# Patient Record
Sex: Female | Born: 1950
Health system: Southern US, Community
[De-identification: ages and names within clinical notes are randomized; demographics above are authoritative.]

## PROBLEM LIST (undated history)

## (undated) DIAGNOSIS — N189 Chronic kidney disease, unspecified: Secondary | ICD-10-CM

## (undated) DIAGNOSIS — I10 Essential (primary) hypertension: Secondary | ICD-10-CM

## (undated) DIAGNOSIS — R0602 Shortness of breath: Secondary | ICD-10-CM

## (undated) DIAGNOSIS — K219 Gastro-esophageal reflux disease without esophagitis: Secondary | ICD-10-CM

## (undated) DIAGNOSIS — D649 Anemia, unspecified: Secondary | ICD-10-CM

## (undated) DIAGNOSIS — F329 Major depressive disorder, single episode, unspecified: Secondary | ICD-10-CM

## (undated) DIAGNOSIS — M25519 Pain in unspecified shoulder: Secondary | ICD-10-CM

## (undated) DIAGNOSIS — M1990 Unspecified osteoarthritis, unspecified site: Secondary | ICD-10-CM

## (undated) HISTORY — DX: Shortness of breath: R06.02

## (undated) HISTORY — DX: Essential (primary) hypertension: I10

## (undated) HISTORY — PX: TUBAL LIGATION: SHX77

## (undated) HISTORY — DX: Pain in unspecified shoulder: M25.519

## (undated) HISTORY — PX: EYE SURGERY: SHX253

---

## 1986-04-24 HISTORY — PX: BREAST SURGERY: SHX581

## 1998-03-26 ENCOUNTER — Other Ambulatory Visit: Admission: RE | Admit: 1998-03-26 | Discharge: 1998-03-26 | Payer: Self-pay | Admitting: *Deleted

## 1999-04-04 ENCOUNTER — Other Ambulatory Visit: Admission: RE | Admit: 1999-04-04 | Discharge: 1999-04-04 | Payer: Self-pay | Admitting: *Deleted

## 1999-09-14 ENCOUNTER — Encounter: Payer: Self-pay | Admitting: *Deleted

## 1999-09-14 ENCOUNTER — Ambulatory Visit (HOSPITAL_COMMUNITY): Admission: RE | Admit: 1999-09-14 | Discharge: 1999-09-14 | Payer: Self-pay | Admitting: *Deleted

## 2000-11-01 ENCOUNTER — Ambulatory Visit (HOSPITAL_COMMUNITY): Admission: RE | Admit: 2000-11-01 | Discharge: 2000-11-01 | Payer: Self-pay | Admitting: *Deleted

## 2000-11-01 ENCOUNTER — Encounter: Payer: Self-pay | Admitting: *Deleted

## 2001-04-24 HISTORY — PX: COLONOSCOPY: SHX174

## 2002-07-01 ENCOUNTER — Other Ambulatory Visit: Admission: RE | Admit: 2002-07-01 | Discharge: 2002-07-01 | Payer: Self-pay | Admitting: *Deleted

## 2003-05-19 ENCOUNTER — Encounter: Admission: RE | Admit: 2003-05-19 | Discharge: 2003-05-19 | Payer: Self-pay | Admitting: Sports Medicine

## 2003-05-21 ENCOUNTER — Ambulatory Visit (HOSPITAL_COMMUNITY): Admission: RE | Admit: 2003-05-21 | Discharge: 2003-05-21 | Payer: Self-pay | Admitting: Sports Medicine

## 2003-05-25 ENCOUNTER — Encounter: Admission: RE | Admit: 2003-05-25 | Discharge: 2003-05-25 | Payer: Self-pay | Admitting: Sports Medicine

## 2004-02-23 ENCOUNTER — Ambulatory Visit (HOSPITAL_COMMUNITY): Admission: RE | Admit: 2004-02-23 | Discharge: 2004-02-23 | Payer: Self-pay | Admitting: Internal Medicine

## 2005-05-02 ENCOUNTER — Ambulatory Visit (HOSPITAL_COMMUNITY): Admission: RE | Admit: 2005-05-02 | Discharge: 2005-05-02 | Payer: Self-pay | Admitting: Obstetrics and Gynecology

## 2005-05-09 ENCOUNTER — Ambulatory Visit: Payer: Self-pay | Admitting: Internal Medicine

## 2005-06-15 ENCOUNTER — Ambulatory Visit: Payer: Self-pay | Admitting: Internal Medicine

## 2006-05-31 ENCOUNTER — Ambulatory Visit: Payer: Self-pay | Admitting: Internal Medicine

## 2006-06-12 ENCOUNTER — Ambulatory Visit (HOSPITAL_COMMUNITY): Admission: RE | Admit: 2006-06-12 | Discharge: 2006-06-12 | Payer: Self-pay | Admitting: Obstetrics and Gynecology

## 2007-02-12 ENCOUNTER — Ambulatory Visit: Payer: Self-pay | Admitting: Internal Medicine

## 2007-02-18 ENCOUNTER — Telehealth (INDEPENDENT_AMBULATORY_CARE_PROVIDER_SITE_OTHER): Payer: Self-pay | Admitting: *Deleted

## 2007-07-09 ENCOUNTER — Ambulatory Visit (HOSPITAL_COMMUNITY): Admission: RE | Admit: 2007-07-09 | Discharge: 2007-07-09 | Payer: Self-pay | Admitting: Internal Medicine

## 2007-08-12 ENCOUNTER — Telehealth (INDEPENDENT_AMBULATORY_CARE_PROVIDER_SITE_OTHER): Payer: Self-pay | Admitting: *Deleted

## 2007-12-02 ENCOUNTER — Ambulatory Visit: Payer: Self-pay | Admitting: Internal Medicine

## 2007-12-02 DIAGNOSIS — D239 Other benign neoplasm of skin, unspecified: Secondary | ICD-10-CM | POA: Insufficient documentation

## 2007-12-02 DIAGNOSIS — J302 Other seasonal allergic rhinitis: Secondary | ICD-10-CM | POA: Insufficient documentation

## 2007-12-02 DIAGNOSIS — M21619 Bunion of unspecified foot: Secondary | ICD-10-CM | POA: Insufficient documentation

## 2008-06-30 ENCOUNTER — Ambulatory Visit: Payer: Self-pay | Admitting: Internal Medicine

## 2008-06-30 DIAGNOSIS — R0609 Other forms of dyspnea: Secondary | ICD-10-CM | POA: Insufficient documentation

## 2008-06-30 DIAGNOSIS — R0989 Other specified symptoms and signs involving the circulatory and respiratory systems: Secondary | ICD-10-CM

## 2008-06-30 DIAGNOSIS — Z8679 Personal history of other diseases of the circulatory system: Secondary | ICD-10-CM | POA: Insufficient documentation

## 2008-07-01 ENCOUNTER — Encounter: Payer: Self-pay | Admitting: Internal Medicine

## 2008-07-17 ENCOUNTER — Telehealth: Payer: Self-pay | Admitting: Internal Medicine

## 2008-07-28 ENCOUNTER — Telehealth (INDEPENDENT_AMBULATORY_CARE_PROVIDER_SITE_OTHER): Payer: Self-pay | Admitting: *Deleted

## 2008-09-14 ENCOUNTER — Encounter: Payer: Self-pay | Admitting: Internal Medicine

## 2008-09-14 ENCOUNTER — Ambulatory Visit (HOSPITAL_COMMUNITY): Admission: RE | Admit: 2008-09-14 | Discharge: 2008-09-14 | Payer: Self-pay | Admitting: Internal Medicine

## 2009-01-04 ENCOUNTER — Encounter (INDEPENDENT_AMBULATORY_CARE_PROVIDER_SITE_OTHER): Payer: Self-pay | Admitting: *Deleted

## 2009-03-25 ENCOUNTER — Ambulatory Visit: Payer: Self-pay | Admitting: Internal Medicine

## 2009-05-03 ENCOUNTER — Telehealth: Payer: Self-pay | Admitting: Internal Medicine

## 2009-06-25 ENCOUNTER — Ambulatory Visit: Payer: Self-pay | Admitting: Internal Medicine

## 2009-06-25 DIAGNOSIS — J1189 Influenza due to unidentified influenza virus with other manifestations: Secondary | ICD-10-CM | POA: Insufficient documentation

## 2009-06-25 LAB — CONVERTED CEMR LAB
Inflenza A Ag: NEGATIVE
Influenza B Ag: POSITIVE

## 2009-10-08 ENCOUNTER — Encounter: Payer: Self-pay | Admitting: Internal Medicine

## 2009-10-21 ENCOUNTER — Encounter: Payer: Self-pay | Admitting: Internal Medicine

## 2009-11-15 ENCOUNTER — Telehealth (INDEPENDENT_AMBULATORY_CARE_PROVIDER_SITE_OTHER): Payer: Self-pay | Admitting: *Deleted

## 2010-01-06 ENCOUNTER — Encounter: Payer: Self-pay | Admitting: Internal Medicine

## 2010-01-25 ENCOUNTER — Ambulatory Visit (HOSPITAL_COMMUNITY): Admission: RE | Admit: 2010-01-25 | Discharge: 2010-01-25 | Payer: Self-pay | Admitting: Obstetrics and Gynecology

## 2010-02-07 ENCOUNTER — Encounter: Admission: RE | Admit: 2010-02-07 | Discharge: 2010-02-07 | Payer: Self-pay | Admitting: Obstetrics and Gynecology

## 2010-05-14 ENCOUNTER — Encounter: Payer: Self-pay | Admitting: Sports Medicine

## 2010-05-14 ENCOUNTER — Encounter: Payer: Self-pay | Admitting: Obstetrics and Gynecology

## 2010-05-26 NOTE — Letter (Signed)
Summary: [facility]  [facility]   Imported By: Lanelle Bal 11/04/2009 14:19:34  _____________________________________________________________________  External Attachment:    Type:   Image     Comment:   External Document

## 2010-05-26 NOTE — Progress Notes (Signed)
Summary: refill changed pharmacy  Phone Note Refill Request Message from:  Patient  Refills Requested: Medication #1:  SINGULAIR 10 MG TABS take 1 tab once daily patient changing pharmacy - fax to [facility] out patient - phone [phone]  Initial call taken by: Okey Regal [place],  November 15, 2009 11:44 AM    Prescriptions: SINGULAIR 10 MG TABS (MONTELUKAST SODIUM) take 1 tab once daily  #30 Each x 5   Entered by:   Shonna Chock CMA   Authorized by:   Marga Melnick MD   Signed by:   Shonna Chock CMA on 11/15/2009   Method used:   Electronically to        [facility]* (retail)       [address]       [address] [place] [employer]       [place], [place]  [address]       Ph: [phone]       Fax: [phone]   RxID:   [id number]

## 2010-05-26 NOTE — Letter (Signed)
Summary: [facility]  [facility]   Imported By: Lanelle Bal 01/20/2010 11:50:36  _____________________________________________________________________  External Attachment:    Type:   Image     Comment:   External Document

## 2010-05-26 NOTE — Progress Notes (Signed)
Summary: Referral  Phone Note Call from Patient Call back at [phone]   Caller: Patient Details for Reason: Referral Summary of Call: Patient is requesting a referral tor a orthopedic Dr. Dr Lestine Box. Patient is has a bunion. Patient states she has a appt this afternoon and need a referral first before they see her. Initial call taken by: Barb Merino,  May 03, 2009 8:43 AM  Follow-up for Phone Call        dr Hero Kulish pls advise................Marland KitchenFelecia Deloach CMA  May 03, 2009 8:53 AM   Additional Follow-up for Phone Call Additional follow up Details #1::        REFERRAL FAXED TO [facility]. Additional Follow-up by: Magdalen Spatz [facility],  May 03, 2009 9:33 AM

## 2010-05-26 NOTE — Assessment & Plan Note (Signed)
Summary: FEVER, BAD HEADACHES, ACHEY, FLU?////SPH   Vital Signs:  Patient profile:   60 year old female Weight:      108.8 pounds Temp:     98.6 degrees F oral Resp:     16 per minute BP sitting:   100 / 70  (left arm)  Vitals Entered By: Doristine Devoid (June 25, 2009 1:15 PM) CC: HA and bodyaches xmon. along w/ sinus congestion and cough    CC:  HA and bodyaches xmon. along w/ sinus congestion and cough .  History of Present Illness: Onset 06/20/2009 as indigestion followed by sinus congestion & ST in context of flying over weekend . Profound fatigue  Diffuse headache as of 03/01..Now chest congestion as of today.  Allergies: 1)  ! Demerol 2)  ! Percocet  Review of Systems General:  Complains of fatigue; denies chills, fever, and sweats; Last chills & sweats 03/03 am. ENT:  Denies nasal congestion and sinus pressure; No frontal headache , facial pain or purulence. Resp:  Complains of cough and sputum productive; Scant "mucus" w/o purulence. MS:  Complains of joint pain and muscle aches; denies joint redness and joint swelling; MS symptoms as of 03/02.  Physical Exam  General:  Thin , fatigued but in no acute distress; alert,appropriate and cooperative throughout examination Eyes:  No corneal or conjunctival inflammation noted.No icterus Ears:  External ear exam shows no significant lesions or deformities.  Otoscopic examination reveals clear canals, tympanic membranes are intact bilaterally without bulging, retraction, inflammation or discharge. Hearing is grossly normal bilaterally. Nose:  External nasal examination shows no deformity or inflammation. Nasal mucosa are dry without lesions or exudates. Mouth:  Oral mucosa and oropharynx without lesions or exudates.  Teeth in good repair. Lungs:  Normal respiratory effort, chest expands symmetrically. Lungs are clear to auscultation, no crackles or wheezes. Heart:  regular rhythm and bradycardia.   Skin:  No jaundice ; skin  dry Cervical Nodes:  No lymphadenopathy noted Axillary Nodes:  No palpable lymphadenopathy   Impression & Recommendations:  Problem # 1:  INFLUENZA (ICD-487.8)  Orders: Flu A+B (72536)  Complete Medication List: 1)  Zyrtec Allergy 10 Mg Tabs (Cetirizine hcl) .Marland Kitchen.. 1 by mouth once daily as needed 2)  Nasonex 50 Mcg/act Susp (Mometasone furoate) .Marland Kitchen.. 1spray once daily - two times a day as needed 3)  Ambien 10 Mg Tabs (Zolpidem tartrate) .... As needed 4)  Singulair 10 Mg Tabs (Montelukast sodium) .... Take 1 tab once daily 5)  Proair Hfa 108 (90 Base) Mcg/act Aers (Albuterol sulfate) .... Take 1-2 puffs q 4 hours as needed 6)  Advair Diskus 100-50 Mcg/dose Aepb (Fluticasone-salmeterol) .Marland Kitchen.. 1 inhalation every 12 hrs as needed , gargle after use 7)  Tamiflu 75 Mg Caps (Oseltamivir phosphate) .Marland Kitchen.. 1 two times a day 8)  Benzonatate 100 Mg Caps (Benzonatate) .Marland Kitchen.. 1 q 8 hrs as needed cough  Patient Instructions: 1)  Vitamin C 2000 mg once daily , +/- Echinacea, Zicam Melts. Neti pot once daily if sinus congestion is present. 2)  Drink as much fluid as you can tolerate for the next few days. Prescriptions: BENZONATATE 100 MG CAPS (BENZONATATE) 1 q 8 hrs as needed cough  #15 x 0   Entered and Authorized by:   Marga Melnick MD   Signed by:   Marga Melnick MD on 06/25/2009   Method used:   Faxed to ...       [facility] [address] # [phone]* (retail)  [address]       [place], [place]  [address]       Ph: [phone]       Fax: [phone]   RxID:   [id number] TAMIFLU 75 MG CAPS (OSELTAMIVIR PHOSPHATE) 1 two times a day  #10 x 0   Entered and Authorized by:   Marga Melnick MD   Signed by:   Marga Melnick MD on 06/25/2009   Method used:   Faxed to ...       [facility] [address] # [phone]* (retail)       [address]       [place], [place]  [address]       Ph: [phone]       Fax: [phone]   RxID:   [id number]   Laboratory Results    Other Tests  Influenza A:  negative Influenza B: positive  Kit Test Internal QC: Positive   (Normal Range: Negative)

## 2010-05-26 NOTE — Letter (Signed)
Summary: [facility]  [facility]   Imported By: Lanelle Bal 10/26/2009 08:34:01  _____________________________________________________________________  External Attachment:    Type:   Image     Comment:   External Document

## 2010-09-16 ENCOUNTER — Ambulatory Visit: Payer: BC Managed Care – PPO | Attending: Orthopedic Surgery | Admitting: Physical Therapy

## 2010-09-16 DIAGNOSIS — IMO0001 Reserved for inherently not codable concepts without codable children: Secondary | ICD-10-CM | POA: Insufficient documentation

## 2010-09-16 DIAGNOSIS — M25659 Stiffness of unspecified hip, not elsewhere classified: Secondary | ICD-10-CM | POA: Insufficient documentation

## 2010-09-16 DIAGNOSIS — M25559 Pain in unspecified hip: Secondary | ICD-10-CM | POA: Insufficient documentation

## 2010-09-20 ENCOUNTER — Encounter: Payer: BC Managed Care – PPO | Admitting: Physical Therapy

## 2010-09-22 ENCOUNTER — Encounter: Admitting: Physical Therapy

## 2010-09-26 ENCOUNTER — Encounter: Payer: BC Managed Care – PPO | Admitting: Physical Therapy

## 2010-09-28 ENCOUNTER — Encounter: Payer: BC Managed Care – PPO | Admitting: Physical Therapy

## 2010-10-03 ENCOUNTER — Encounter: Payer: BC Managed Care – PPO | Admitting: Physical Therapy

## 2010-10-05 ENCOUNTER — Encounter: Payer: BC Managed Care – PPO | Admitting: Physical Therapy

## 2010-10-10 ENCOUNTER — Encounter: Payer: BC Managed Care – PPO | Admitting: Physical Therapy

## 2010-10-12 ENCOUNTER — Encounter: Payer: BC Managed Care – PPO | Admitting: Physical Therapy

## 2010-10-17 ENCOUNTER — Encounter: Payer: BC Managed Care – PPO | Admitting: Physical Therapy

## 2010-10-19 ENCOUNTER — Encounter: Payer: BC Managed Care – PPO | Admitting: Physical Therapy

## 2011-06-06 ENCOUNTER — Other Ambulatory Visit: Payer: Self-pay | Admitting: Obstetrics and Gynecology

## 2011-06-06 DIAGNOSIS — Z1231 Encounter for screening mammogram for malignant neoplasm of breast: Secondary | ICD-10-CM

## 2011-06-14 ENCOUNTER — Ambulatory Visit

## 2011-06-28 ENCOUNTER — Ambulatory Visit
Admission: RE | Admit: 2011-06-28 | Discharge: 2011-06-28 | Disposition: A | Discharge status: Home / Self Care | Payer: 59 | Source: Ambulatory Visit | Attending: Obstetrics and Gynecology | Admitting: Obstetrics and Gynecology

## 2011-06-28 ENCOUNTER — Other Ambulatory Visit: Payer: Self-pay | Admitting: Obstetrics and Gynecology

## 2011-06-28 DIAGNOSIS — Z1231 Encounter for screening mammogram for malignant neoplasm of breast: Secondary | ICD-10-CM

## 2011-07-28 ENCOUNTER — Ambulatory Visit (INDEPENDENT_AMBULATORY_CARE_PROVIDER_SITE_OTHER): Admitting: Internal Medicine

## 2011-07-28 ENCOUNTER — Encounter: Payer: Self-pay | Admitting: Internal Medicine

## 2011-07-28 VITALS — BP 110/72 | HR 81 | Temp 98.4°F | Resp 12 | Ht 64.5 in | Wt 110.6 lb

## 2011-07-28 DIAGNOSIS — D239 Other benign neoplasm of skin, unspecified: Secondary | ICD-10-CM

## 2011-07-28 DIAGNOSIS — Z0000 Encounter for general adult medical examination without abnormal findings: Secondary | ICD-10-CM

## 2011-07-28 MED ORDER — MOMETASONE FUROATE 50 MCG/ACT NA SUSP: 2.0000 | Freq: Every day | NASAL | Status: DC | PRN

## 2011-07-28 MED ORDER — ALBUTEROL SULFATE HFA 108 (90 BASE) MCG/ACT IN AERS: 2.0000 | INHALATION_SPRAY | Freq: Four times a day (QID) | RESPIRATORY_TRACT | Status: DC | PRN

## 2011-07-28 NOTE — Progress Notes (Signed)
  Subjective:    Patient ID: Jessica [name], female    DOB: 1950-12-21, 61 y.o.   MRN: [id number]  HPI  Jessica [name] is here for a physical;acute issues include recent viral induced gastroenteritis, headache &   fatigue      Review of Systems Patient reports no significant  vision/ hearing  changes, adenopathy,fever, weight change,  persistant / recurrent hoarseness , swallowing issues, chest pain,palpitations,edema,persistant /recurrent cough, hemoptysis, dyspnea( rest/ exertional/paroxysmal nocturnal), gastrointestinal bleeding(melena, rectal bleeding), abdominal pain, significant heartburn,  bowel changes,GU symptoms(dysuria, hematuria,pyuria, incontinence), Gyn symptoms(abnormal  bleeding , pain),  syncope, focal weakness, memory loss,numbness & tingling, hair /nail changes,abnormal bruising or bleeding, anxiety,or depression.      Objective:   Physical Exam Gen.: Thin but healthy and well-nourished in appearance. Alert, appropriate and cooperative throughout exam. Head: Normocephalic without obvious abnormalities  Eyes: No corneal or conjunctival inflammation noted. Pupils equal round reactive to light and accommodation. Fundal exam is benign without hemorrhages, exudate, papilledema. Extraocular motion intact. Vision grossly normal. Ears: External  ear exam reveals no significant lesions or deformities. Canals clear .TMs normal. Hearing is grossly normal bilaterally. Nose: External nasal exam reveals no deformity or inflammation. Nasal mucosa are pink and moist. No lesions or exudates noted.   Mouth: Oral mucosa and oropharynx reveal no lesions or exudates. Teeth in good repair. Neck: No deformities, masses, or tenderness noted. Range of motion & Thyroid small. Lungs: Normal respiratory effort; chest expands symmetrically. Lungs are clear to auscultation without rales, wheezes, or increased work of breathing. Heart: Normal rate and rhythm. Accentuated S2. S4 with possible click . No significant  murmur. Abdomen: Bowel sounds normal; abdomen soft and nontender. No masses, organomegaly or hernias noted.Aorta palpable with bruit ; no AAA  Genitalia: Dr Vincente Poli                                        Musculoskeletal/extremities: No deformity or scoliosis noted of  the thoracic or lumbar spine. No clubbing, cyanosis, edema, or deformity noted. Range of motion  normal .Tone & strength  normal. Nail health  good. Minor flexion contracture at the DIP joint fifth finger bilaterally Vascular: Carotid, radial artery, dorsalis pedis and  posterior tibial pulses are full and equal. No bruits present. Neurologic: Alert and oriented x3. Deep tendon reflexes symmetrical and normal.          Skin: Intact without suspicious lesions or rashes. Lymph: No cervical, axillary lymphadenopathy present. Psych: Mood and affect are normal. Normally interactive                                                                                        Assessment & Plan:  #1 comprehensive physical exam; no acute findings #2 see Problem List with Assessments & Recommendations Plan: see Orders

## 2011-07-28 NOTE — Patient Instructions (Addendum)
Preventive Health Care: Exercise  30-45  minutes a day, 3-4 days a week. Walking is especially valuable in preventing Osteoporosis. Eat a low-fat diet with lots of fruits and vegetables, up to 7-9 servings per day. Consume less than 30 grams of sugar per day from foods & drinks with High Fructose Corn Syrup as #1 ,2,3 or #4 on label. Health Care Power of Attorney & Living Will place you in charge of your health care  decisions. Verify these are  in place. Nasal cleansing in the shower as discussed. Make sure that all residual soap is removed to prevent irritation.  Nasonex 1 spray in each nostril twice a day as needed. Use the "crossover" technique as discussed

## 2011-07-28 NOTE — Progress Notes (Signed)
Addended by: Maurice Small on: 07/28/2011 04:10 PM   Modules accepted: Orders

## 2011-12-06 ENCOUNTER — Encounter: Payer: Self-pay | Admitting: *Deleted

## 2011-12-13 ENCOUNTER — Encounter: Payer: Self-pay | Admitting: Internal Medicine

## 2011-12-15 ENCOUNTER — Ambulatory Visit (INDEPENDENT_AMBULATORY_CARE_PROVIDER_SITE_OTHER): Payer: 59 | Admitting: Internal Medicine

## 2011-12-15 ENCOUNTER — Encounter: Payer: Self-pay | Admitting: Internal Medicine

## 2011-12-15 VITALS — BP 144/96 | HR 67 | Temp 98.0°F | Wt 114.6 lb

## 2011-12-15 DIAGNOSIS — R072 Precordial pain: Secondary | ICD-10-CM

## 2011-12-15 DIAGNOSIS — R5381 Other malaise: Secondary | ICD-10-CM

## 2011-12-15 DIAGNOSIS — R5383 Other fatigue: Secondary | ICD-10-CM

## 2011-12-15 DIAGNOSIS — K219 Gastro-esophageal reflux disease without esophagitis: Secondary | ICD-10-CM

## 2011-12-15 LAB — CBC WITH DIFFERENTIAL/PLATELET
Basophils Relative: 0 % (ref 0–1)
Eosinophils Absolute: 0.3 10*3/uL (ref 0.0–0.7)
HCT: 36.8 % (ref 36.0–46.0)
Hemoglobin: 12.7 g/dL (ref 12.0–15.0)
MCH: 31.6 pg (ref 26.0–34.0)
MCHC: 34.5 g/dL (ref 30.0–36.0)
Monocytes Absolute: 0.5 10*3/uL (ref 0.1–1.0)
Monocytes Relative: 7 % (ref 3–12)
Neutro Abs: 3.9 10*3/uL (ref 1.7–7.7)

## 2011-12-15 MED ORDER — ESOMEPRAZOLE MAGNESIUM 40 MG PO CPDR: 40.0000 mg | DELAYED_RELEASE_CAPSULE | Freq: Every day | ORAL | Status: DC

## 2011-12-15 NOTE — Progress Notes (Signed)
  Subjective:    Patient ID: Jessica [name], female    DOB: [birth year], 61 y.o.   MRN: [id number]  HPI For several months she's had intermittent substernal burning with radiation to the neck at rest. She typically exercises 3 times a week for an hour without associated chest pain. She has noted that she has marked fatigue in her legs after exercise program, new phenomena. She describes marked fatigue and exhaustion; she feels that she can fall asleep throughout the day.  She had the substernal burning all night last night; no position change affected this.  She takes TUMS as needed for reflux symptoms; she'll take from 0-8 a day. She's also been employing a probiotic for reflux.  This Spring her brother was found to have greater than 90% blockage. His symptoms were substernal burning.  Past medical history/family history/social history were all reviewed and updated. Pertinent data: She has no exercise-induced bronchospasm as well as allergen induced reactive airways disease. She states her present symptoms do not mimic her typical asthma    Review of Systems She denies hoarseness, dysphagia, abdominal pain, melena, rectal bleeding. She has not had cough, sputum production, or hemoptysis.     Objective:   Physical Exam General appearance : thin but in good health and nourishment w/o distress.  Eyes: No conjunctival inflammation or scleral icterus is present.  Oral exam: Dental hygiene is good; lips and gums are healthy appearing.There is no oropharyngeal erythema or exudate noted.   Heart:  Normal rate and regular rhythm. S1 and S2 normal without gallop,  click, rub . Grade 1 systolic murmur right base    Lungs:Chest clear to auscultation; no wheezes, rhonchi,rales ,or rubs present.No increased work of breathing.   Abdomen: bowel sounds normal, soft and non-tender without masses, organomegaly or hernias noted.  No guarding or rebound . Aorta palpable without enlargement or  aneurysm  Skin:Warm & dry.  Intact without suspicious lesions or rashes ; no jaundice or tenting  Extremities: No clubbing, cyanosis, or edema. Homans sign is negative bilaterally  Lymphatic: No lymphadenopathy is noted about the head, neck, axilla areas.              Assessment & Plan:  #1 substernal burning in the context of known reflux, RAD and family history coronary disease/MI. EKG reveals incomplete right bundle branch block and single PAC. No ischemic changes present.By history her brother had a normal EKG and stress test prior to his LAD stenting for greater than 90% block.  Plan: She'll be placed on PPIs twice a day pending further evaluation. See labs. She will need stress testing. Nuclear stress testing would be preferred as a diagnostic option

## 2011-12-15 NOTE — Addendum Note (Signed)
Addended by: Mauri Reading on: 12/15/2011 04:25 PM   Modules accepted: Orders

## 2011-12-15 NOTE — Patient Instructions (Addendum)
The triggers for reflux or "heart burn"  include stress; the "aspirin family" ; alcohol; peppermint; and caffeine (coffee, tea, cola, and chocolate). The aspirin family would include aspirin and the nonsteroidal agents such as ibuprofen &  Naproxen. Tylenol would not cause reflux. If having symptoms ; food & drink should be avoided for @ least 2 hours before going to bed.   If you activate My Chart; the results can be released to you as soon as they populate from the lab. If you choose not to use this program; the labs have to be reviewed, copied & mailed   causing a delay in getting the results to you.

## 2011-12-16 LAB — CK TOTAL AND CKMB (NOT AT ARMC): Total CK: 83 U/L (ref 7–177)

## 2011-12-21 ENCOUNTER — Encounter: Payer: Self-pay | Admitting: Internal Medicine

## 2011-12-22 ENCOUNTER — Encounter: Payer: Self-pay | Admitting: Internal Medicine

## 2011-12-27 ENCOUNTER — Encounter: Payer: Self-pay | Admitting: Internal Medicine

## 2012-01-10 ENCOUNTER — Encounter: Payer: Self-pay | Admitting: Internal Medicine

## 2012-01-10 ENCOUNTER — Encounter: Payer: Self-pay | Admitting: Physician Assistant

## 2012-01-10 ENCOUNTER — Ambulatory Visit (INDEPENDENT_AMBULATORY_CARE_PROVIDER_SITE_OTHER): Payer: 59 | Admitting: Physician Assistant

## 2012-01-10 DIAGNOSIS — R072 Precordial pain: Secondary | ICD-10-CM

## 2012-01-10 NOTE — Procedures (Signed)
Jessica [name] is a 61 y.o. female with no hx of CAD, DM2 or HL referred by Marga Melnick, MD for ETT due to recent episodes of atypical chest pain and dyspnea.  BP recently elevated.  No syncope.  Ex-smoker.  Remote FHx of CAD (borther in 71s with PCI).  Exam unremarkable.    Exercise Treadmill Test  Pre-Exercise Testing Evaluation Rhythm: BRADYCARDIA Rate: 59   PR:  .12 QRS:  .09  QT:  .44 QTc: .43     Test  Exercise Tolerance Test Ordering MD: Marga Melnick , MD  Interpreting MD: Tereso Newcomer , PA-C  Unique Test No: 1  Treadmill:  1  Indication for ETT: chest pain - rule out ischemia  Contraindication to ETT: No   Stress Modality: exercise - treadmill  Cardiac Imaging Performed: non   Protocol: standard Bruce - maximal  Max BP:  205/95  Max MPHR (bpm):  158 85% MPR (bpm):  134  MPHR obtained (bpm):  144 % MPHR obtained:  92%  Reached 85% MPHR (min:sec):  10:10 Total Exercise Time (min-sec):  11:00  Workload in METS:  13.4 Borg Scale: 15  Reason ETT Terminated:  patient's desire to stop    ST Segment Analysis At Rest: normal ST segments - no evidence of significant ST depression With Exercise: no evidence of significant ST depression  Other Information Arrhythmia:  No Angina during ETT:  absent (0) Quality of ETT:  diagnostic  ETT Interpretation:  normal - no evidence of ischemia by ST analysis  Comments: Excellent exercise tolerance. No chest pain. Baseline elevated BP with hypertensive BP response to exercise. No ST-T changes to suggest ischemia.  Rare PVC.  Recommendations: Results discussed with [name]. Needs follow up for BP management.  Follow up with Marga Melnick, MD as directed. Tereso Newcomer, PA-C  3:41 PM 01/10/2012

## 2012-01-11 ENCOUNTER — Other Ambulatory Visit: Payer: Self-pay | Admitting: Internal Medicine

## 2012-01-11 ENCOUNTER — Encounter: Payer: Self-pay | Admitting: Internal Medicine

## 2012-01-11 DIAGNOSIS — R030 Elevated blood-pressure reading, without diagnosis of hypertension: Secondary | ICD-10-CM

## 2012-01-11 MED ORDER — METOPROLOL TARTRATE 25 MG PO TABS: ORAL_TABLET | ORAL | Status: DC

## 2012-01-12 ENCOUNTER — Encounter: Payer: Self-pay | Admitting: Internal Medicine

## 2012-01-13 ENCOUNTER — Other Ambulatory Visit: Payer: Self-pay | Admitting: Internal Medicine

## 2012-01-13 DIAGNOSIS — R030 Elevated blood-pressure reading, without diagnosis of hypertension: Secondary | ICD-10-CM

## 2012-01-13 MED ORDER — LISINOPRIL 10 MG PO TABS: 10.0000 mg | ORAL_TABLET | Freq: Every day | ORAL | Status: DC

## 2012-01-15 ENCOUNTER — Encounter: Payer: Self-pay | Admitting: Internal Medicine

## 2012-03-15 ENCOUNTER — Encounter: Payer: Self-pay | Admitting: Internal Medicine

## 2012-03-19 ENCOUNTER — Encounter: Payer: Self-pay | Admitting: Internal Medicine

## 2012-03-28 ENCOUNTER — Other Ambulatory Visit: Payer: Self-pay | Admitting: Internal Medicine

## 2012-03-28 DIAGNOSIS — R030 Elevated blood-pressure reading, without diagnosis of hypertension: Secondary | ICD-10-CM

## 2012-03-28 MED ORDER — LISINOPRIL 10 MG PO TABS: 10.0000 mg | ORAL_TABLET | Freq: Every day | ORAL | Status: DC

## 2012-03-28 NOTE — Telephone Encounter (Signed)
LISINOPRIL 10 MG TABLET LAST FILL: 9.23.13  QTY: 90 TAKE 1 TABLET (10 MG TOTAL) BY MOUTH DAILY

## 2012-03-28 NOTE — Telephone Encounter (Signed)
She will continue on ACE inhibitor lisinopril. The beta blocker will not be used because of a slow heart rate related to her high level of conditioning

## 2012-03-28 NOTE — Telephone Encounter (Signed)
Dr.Hopper please see note from Pharmacy

## 2012-04-30 ENCOUNTER — Encounter: Payer: Self-pay | Admitting: Internal Medicine

## 2012-05-09 ENCOUNTER — Ambulatory Visit (INDEPENDENT_AMBULATORY_CARE_PROVIDER_SITE_OTHER): Payer: 59 | Admitting: Sports Medicine

## 2012-05-09 VITALS — BP 120/74 | Ht 66.0 in | Wt 115.0 lb

## 2012-05-09 DIAGNOSIS — M67919 Unspecified disorder of synovium and tendon, unspecified shoulder: Secondary | ICD-10-CM

## 2012-05-09 DIAGNOSIS — M7580 Other shoulder lesions, unspecified shoulder: Secondary | ICD-10-CM | POA: Insufficient documentation

## 2012-05-09 DIAGNOSIS — M719 Bursopathy, unspecified: Secondary | ICD-10-CM

## 2012-05-09 NOTE — Assessment & Plan Note (Signed)
Involving the subscapularis with minor involvement of the biceps tendon.  Plan: Relative rest, decrease racket tension, rotator cuff strengthening exercises. Biceps tendon eccentric exercises.  Followup in 4-6 weeks. If not improved will do muscular skeletal ultrasound and nitroglycerin patch protocol.  Advised against playing competitive tennis for a few weeks.

## 2012-05-09 NOTE — Progress Notes (Signed)
Jessica [name] is a 63 y.o. female who presents to [facility] today for right shoulder pain. Patient is an avid Armed forces operational officer. She is right-hand dominant. She's been experiencing right shoulder pain for the past 6 months. It is worsening recently forcing her to stop playing tennis about one week ago. She denies any injury.  Additionally she denies any history of shoulder problems. She notes that forehand is the most painful tennis stroke.  She denies problems with serve, lob or backhand.  With activities of daily living she has been reaching back and pulling objects to her.  She denies pain with overhand motion. She does note mild nighttime pain.  String tension in the 60s.   She's tried ice, and ibuprofen which is only worked a bit. Her pain is moderate  PMH reviewed.  Otherwise healthy History  Substance Use Topics  . Smoking status: Former Smoker    Quit date: 04/24/1976  . Smokeless tobacco: Not on file  . Alcohol Use: 4.2 oz/week    7 Glasses of wine per week     Comment: wine   ROS as above otherwise neg   Exam:  BP 120/74  Ht 5\' 6"  (1.676 m)  Wt 115 lb (52.164 kg)  BMI 18.56 kg/m2 Gen: Well NAD MSK:  Right shoulder.  Well-appearing nontender range of motion 180s to abduction 110 external rotation and internal rotation to the lumbar spine. Strength: 5/5 to supraspinatus and external rotation. 4/5 to internal rotation.  Negative Hawkins and Neers tests.  Negative crossover arm test, and negative O'Brien's test.  Positive Yergason's and speeds test.   Positive internal rotation lag test.   Neck: Nontender over spinal midline normal neck range of motion negative Spurling test.  Grip strength sensation pulses are intact in both upper activities.    RT sided assymmetry with longer arm and wider chest

## 2012-05-09 NOTE — Patient Instructions (Addendum)
Thank you for coming in today. We think you have a rotator cuff tendonitis involving the subscapular tendon.  Try reducing your string tension by five to ten pounds. Increase the grip size. Do the exercise we talked about.  Internal rotation, external rotation and raising arm.  Reverse curls with band.  Come back in 4-6 weeks.  TAKE easy for a few weeks.

## 2012-05-13 ENCOUNTER — Encounter: Payer: Self-pay | Admitting: Internal Medicine

## 2012-05-14 ENCOUNTER — Encounter: Payer: Self-pay | Admitting: Internal Medicine

## 2012-05-20 ENCOUNTER — Other Ambulatory Visit: Payer: Self-pay | Admitting: *Deleted

## 2012-05-20 DIAGNOSIS — R030 Elevated blood-pressure reading, without diagnosis of hypertension: Secondary | ICD-10-CM

## 2012-05-20 MED ORDER — LISINOPRIL 10 MG PO TABS: 20.0000 mg | ORAL_TABLET | Freq: Every day | ORAL | Status: DC

## 2012-05-20 NOTE — Telephone Encounter (Signed)
Refill for lisinopril sent to [facility] per pts My Chart request

## 2012-05-21 ENCOUNTER — Encounter: Payer: Self-pay | Admitting: Internal Medicine

## 2012-05-21 ENCOUNTER — Telehealth: Payer: Self-pay

## 2012-05-21 MED ORDER — LISINOPRIL 20 MG PO TABS: 20.0000 mg | ORAL_TABLET | Freq: Two times a day (BID) | ORAL | Status: DC

## 2012-05-21 NOTE — Telephone Encounter (Signed)
Hopp please advise on dose of Lisinopril

## 2012-05-21 NOTE — Telephone Encounter (Signed)
-----   Message sent from Pecola Lawless, MD to [name] at 05/21/2012 3:47 PM ----- Lisinopril 20 mg bid #180. FAX to [facility] please RX sent, patient informed via MyChart

## 2012-06-08 ENCOUNTER — Other Ambulatory Visit: Payer: Self-pay

## 2012-06-13 ENCOUNTER — Ambulatory Visit: Payer: 59 | Admitting: Sports Medicine

## 2012-07-03 ENCOUNTER — Encounter: Payer: Self-pay | Admitting: Internal Medicine

## 2012-07-04 ENCOUNTER — Encounter: Payer: Self-pay | Admitting: Internal Medicine

## 2012-07-04 ENCOUNTER — Ambulatory Visit (INDEPENDENT_AMBULATORY_CARE_PROVIDER_SITE_OTHER): Payer: 59 | Admitting: Internal Medicine

## 2012-07-04 VITALS — BP 122/80 | HR 71 | Temp 98.5°F | Wt 110.8 lb

## 2012-07-04 DIAGNOSIS — I10 Essential (primary) hypertension: Secondary | ICD-10-CM | POA: Insufficient documentation

## 2012-07-04 MED ORDER — AMLODIPINE BESYLATE 5 MG PO TABS: 5.0000 mg | ORAL_TABLET | Freq: Every day | ORAL | Status: DC

## 2012-07-04 NOTE — Progress Notes (Signed)
  Subjective:    Patient ID: Jessica [name], female    DOB: Jul 08, 1950, 62 y.o.   MRN: [id number]  HPI  CHRONIC HYPERTENSION follow-up:  Home blood pressure  average 127/83  Patient is compliant with medications; Lisinopril 20 mg 2 bid  ? adverse effect from medication as dry cough  Exercise program as CVE  4 times per week for 60 minutes  Heart healthy , no added salt diet              Review of Systems No chest pain, palpitations, dyspnea, claudication,edema or paroxysmal nocturnal dyspnea described  No significant lightheadedness, headache, epistaxis, or syncope     Objective:   Physical Exam Appears healthy ; thin but  well-nourished & in no acute distress  No carotid bruits are present.No neck pain distention present at 10 - 15 degrees. Thyroid small   Heart rhythm and rate are normal with short grade 1 systolic murmur at the right base Chest is clear with no increased work of breathing  There is no evidence of aortic aneurysm or renal artery bruits  Abdomen soft with no organomegaly or masses. No HJR. Aorta palpable ; no AAA  No clubbing, cyanosis or edema present.  Pedal pulses are intact   No ischemic skin changes are present . Nails healthy.  Alert and oriented. Strength, tone  normal       Assessment & Plan:

## 2012-07-04 NOTE — Patient Instructions (Addendum)
Decrease the lisinopril to 30 mg twice a day after adding amlodipine 5 mg one half pill. Increase the amlodipine to 5 mg one half pill twice a day and decrease the lisinopril to 20 mg twice a day after 7-10 days if possible based on blood pressure monitor.  Minimal Blood Pressure Goal= AVERAGE < 140/90;  Ideal is an AVERAGE < 135/85. This AVERAGE should be calculated from @ least 5-7 BP readings taken @ different times of day on different days of week. You should not respond to isolated BP readings , but rather the AVERAGE for that week.

## 2012-07-04 NOTE — Assessment & Plan Note (Signed)
Is recommended that lisinopril be decreased to 30 mg twice a day and amlodipine 5 mg one half pill twice a day be added. The amlodipine could be titrated up to 5 mg twice a day if needed and the lisinopril decreased to 20 mg twice a day if possible.

## 2012-07-17 ENCOUNTER — Encounter: Payer: Self-pay | Admitting: Internal Medicine

## 2012-08-06 ENCOUNTER — Encounter: Payer: Self-pay | Admitting: Internal Medicine

## 2012-08-06 ENCOUNTER — Other Ambulatory Visit: Payer: Self-pay | Admitting: Internal Medicine

## 2012-08-06 NOTE — Telephone Encounter (Signed)
Hopp please review last OV note, would you like for patient to take 30 mg twice daily (1 1/2 tab bid)?

## 2012-08-06 NOTE — Telephone Encounter (Signed)
RX was already sent

## 2012-08-12 ENCOUNTER — Encounter: Payer: Self-pay | Admitting: *Deleted

## 2012-08-13 ENCOUNTER — Ambulatory Visit (AMBULATORY_SURGERY_CENTER): Payer: 59

## 2012-08-13 VITALS — Ht 64.5 in | Wt 112.8 lb

## 2012-08-13 DIAGNOSIS — Z800 Family history of malignant neoplasm of digestive organs: Secondary | ICD-10-CM

## 2012-08-13 DIAGNOSIS — Z1211 Encounter for screening for malignant neoplasm of colon: Secondary | ICD-10-CM

## 2012-08-13 DIAGNOSIS — Z8371 Family history of colonic polyps: Secondary | ICD-10-CM

## 2012-08-13 MED ORDER — MOVIPREP 100 G PO SOLR: ORAL | Status: DC

## 2012-08-19 ENCOUNTER — Other Ambulatory Visit: Payer: Self-pay | Admitting: Internal Medicine

## 2012-08-23 ENCOUNTER — Encounter: Payer: Self-pay | Admitting: Internal Medicine

## 2012-08-23 ENCOUNTER — Telehealth: Payer: Self-pay | Admitting: Internal Medicine

## 2012-08-23 NOTE — Telephone Encounter (Signed)
Caller: Saoirse/Patient; Phone: [phone]; Reason for Call: Pt calling today 08/23/12 regarding she sent a note this AM via My Chart.  Wants to have an antibiotic called in for chest congestion.  Pt declines triage assessment, just wants note sent to MD.  Onset of symptoms last week.  Coughing up clear mucus.  Afebrile.  PLEASE CALL IN ANTIBIOTICS TO [facility] WHICH IS ON FILE IN HER CHART.  DOES NOT WANT AMOXICILLIN.  PLEASE CALL PT BACK AT [phone] TO LET HER KNOW.  Thanks.

## 2012-08-23 NOTE — Telephone Encounter (Signed)
Message has been addressed via Mychart.

## 2012-08-28 ENCOUNTER — Encounter: Payer: Self-pay | Admitting: Internal Medicine

## 2012-08-28 ENCOUNTER — Ambulatory Visit (AMBULATORY_SURGERY_CENTER): Payer: 59 | Admitting: Internal Medicine

## 2012-08-28 VITALS — BP 123/94 | HR 53 | Temp 97.7°F | Resp 25 | Ht 64.0 in | Wt 112.0 lb

## 2012-08-28 DIAGNOSIS — Z800 Family history of malignant neoplasm of digestive organs: Secondary | ICD-10-CM

## 2012-08-28 DIAGNOSIS — Z8371 Family history of colonic polyps: Secondary | ICD-10-CM

## 2012-08-28 DIAGNOSIS — Z1211 Encounter for screening for malignant neoplasm of colon: Secondary | ICD-10-CM

## 2012-08-28 MED ORDER — SODIUM CHLORIDE 0.9 % IV SOLN: 500.0000 mL | INTRAVENOUS | Status: DC

## 2012-08-28 NOTE — Op Note (Signed)
[facility] [address] [place], [address]   COLONOSCOPY PROCEDURE REPORT  PATIENT: Jessica [name], Jessica [name]  MR#: [id number] BIRTHDATE: September 24, 1950 , 62  yrs. old GENDER: Female ENDOSCOPIST: Hart Carwin, MD REFERRED BY:  Marga Melnick, M.D. PROCEDURE DATE:  08/28/2012 PROCEDURE:   Colonoscopy, screening ASA CLASS:   Class I INDICATIONS:mother with colon polypd, Mat aunt with colon cancer, normal colonoscopy 01/2002. MEDICATIONS: MAC sedation, administered by CRNA and propofol (Diprivan) 350mg  IV  DESCRIPTION OF PROCEDURE:   After the risks and benefits and of the procedure were explained, informed consent was obtained.  A digital rectal exam revealed no abnormalities of the rectum.    The LB [id number]-[id number]  endoscope was introduced through the anus and advanced to the cecum, which was identified by both the appendix and ileocecal valve .  The quality of the prep was good, using MoviPrep .  The instrument was then slowly withdrawn as the colon was fully examined.     COLON FINDINGS: There was moderate diverticulosis noted throughout the entire examined colon with associated tortuosity, muscular hypertrophy and angulation.   Internal hemorrhoids were found. Mild melanosi throughout the colon.     Retroflexed views revealed no abnormalities.     The scope was then withdrawn from the patient and the procedure completed.  COMPLICATIONS: There were no complications. ENDOSCOPIC IMPRESSION: 1.   There was moderate diverticulosis noted throughout the entire examined colon 2.   Internal hemorrhoids 3. mild melanosis coli  RECOMMENDATIONS: High fiber diet Metamucil 1 tsp daily   REPEAT EXAM: In 10 year(s)  for Colonoscopy.  cc:  _______________________________ eSignedHart Carwin, MD 08/28/2012 11:08 AM     PATIENT NAME:  [name], [name] MR#: [id number]

## 2012-08-28 NOTE — Patient Instructions (Addendum)

## 2012-08-28 NOTE — Progress Notes (Signed)
Patient did not experience any of the following events: a burn prior to discharge; a fall within the facility; wrong site/side/patient/procedure/implant event; or a hospital transfer or hospital admission upon discharge from the facility. (G8907) Patient did not have preoperative order for IV antibiotic SSI prophylaxis. (G8918)  

## 2012-08-29 ENCOUNTER — Telehealth: Payer: Self-pay

## 2012-08-29 NOTE — Telephone Encounter (Signed)
  Follow up Call-  Call back number 08/28/2012  Post procedure Call Back phone  # [phone]  Permission to leave phone message Yes     Patient questions:  Do you have a fever, pain , or abdominal swelling? no Pain Score  0 *  Have you tolerated food without any problems? yes  Have you been able to return to your normal activities? yes  Do you have any questions about your discharge instructions: Diet   no Medications  no Follow up visit  no  Do you have questions or concerns about your Care? no  Actions: * If pain score is 4 or above: No action needed, pain <4.  Per the pt when she went home she noticed in her Moviprep box that she did not mix packet B in one of the solutions to drink.  I reviewed the colonoscopy report and Dr. Juanda Chance said the quality of the prep was good. Maw

## 2012-09-04 ENCOUNTER — Encounter: Payer: Self-pay | Admitting: Internal Medicine

## 2012-09-05 ENCOUNTER — Other Ambulatory Visit: Payer: Self-pay | Admitting: Internal Medicine

## 2012-09-05 ENCOUNTER — Encounter: Payer: Self-pay | Admitting: Internal Medicine

## 2012-09-05 DIAGNOSIS — R5383 Other fatigue: Secondary | ICD-10-CM

## 2012-09-05 DIAGNOSIS — R5381 Other malaise: Secondary | ICD-10-CM

## 2012-09-24 ENCOUNTER — Other Ambulatory Visit (INDEPENDENT_AMBULATORY_CARE_PROVIDER_SITE_OTHER): Payer: 59

## 2012-09-24 DIAGNOSIS — R5383 Other fatigue: Secondary | ICD-10-CM

## 2012-09-24 DIAGNOSIS — R5381 Other malaise: Secondary | ICD-10-CM

## 2012-09-25 LAB — SEDIMENTATION RATE: Sed Rate: 5 mm/hr (ref 0–22)

## 2012-09-25 LAB — CBC WITH DIFFERENTIAL/PLATELET
Eosinophils Absolute: 0.1 10*3/uL (ref 0.0–0.7)
MCHC: 33.6 g/dL (ref 30.0–36.0)
MCV: 97.3 fl (ref 78.0–100.0)
Monocytes Absolute: 0.4 10*3/uL (ref 0.1–1.0)
Neutrophils Relative %: 68.3 % (ref 43.0–77.0)
Platelets: 272 10*3/uL (ref 150.0–400.0)
RDW: 12.5 % (ref 11.5–14.6)

## 2012-09-25 LAB — BASIC METABOLIC PANEL
CO2: 28 mEq/L (ref 19–32)
Calcium: 9.8 mg/dL (ref 8.4–10.5)
Creatinine, Ser: 0.9 mg/dL (ref 0.4–1.2)

## 2012-09-25 LAB — TSH: TSH: 1.52 u[IU]/mL (ref 0.35–5.50)

## 2012-09-27 ENCOUNTER — Encounter: Payer: Self-pay | Admitting: Internal Medicine

## 2012-10-04 ENCOUNTER — Other Ambulatory Visit: Payer: Self-pay | Admitting: Internal Medicine

## 2012-10-26 ENCOUNTER — Emergency Department (HOSPITAL_BASED_OUTPATIENT_CLINIC_OR_DEPARTMENT_OTHER)
Admission: EM | Admit: 2012-10-26 | Discharge: 2012-10-26 | Disposition: A | Discharge status: Home / Self Care | Payer: 59 | Attending: Emergency Medicine | Admitting: Emergency Medicine

## 2012-10-26 ENCOUNTER — Emergency Department (HOSPITAL_BASED_OUTPATIENT_CLINIC_OR_DEPARTMENT_OTHER): Payer: 59

## 2012-10-26 ENCOUNTER — Encounter (HOSPITAL_BASED_OUTPATIENT_CLINIC_OR_DEPARTMENT_OTHER): Payer: Self-pay | Admitting: *Deleted

## 2012-10-26 DIAGNOSIS — J40 Bronchitis, not specified as acute or chronic: Secondary | ICD-10-CM

## 2012-10-26 DIAGNOSIS — Z79899 Other long term (current) drug therapy: Secondary | ICD-10-CM | POA: Insufficient documentation

## 2012-10-26 DIAGNOSIS — I10 Essential (primary) hypertension: Secondary | ICD-10-CM | POA: Insufficient documentation

## 2012-10-26 DIAGNOSIS — Z87891 Personal history of nicotine dependence: Secondary | ICD-10-CM | POA: Insufficient documentation

## 2012-10-26 DIAGNOSIS — J45901 Unspecified asthma with (acute) exacerbation: Secondary | ICD-10-CM | POA: Insufficient documentation

## 2012-10-26 DIAGNOSIS — Z8709 Personal history of other diseases of the respiratory system: Secondary | ICD-10-CM | POA: Insufficient documentation

## 2012-10-26 MED ORDER — PREDNISONE 50 MG PO TABS
60.0000 mg | ORAL_TABLET | Freq: Once | ORAL | Status: AC
  Administered 2012-10-26: 60 mg via ORAL
  Filled 2012-10-26: qty 1

## 2012-10-26 MED ORDER — IPRATROPIUM BROMIDE 0.02 % IN SOLN
0.5000 mg | Freq: Once | RESPIRATORY_TRACT | Status: AC
  Administered 2012-10-26: 0.5 mg via RESPIRATORY_TRACT

## 2012-10-26 MED ORDER — ALBUTEROL SULFATE HFA 108 (90 BASE) MCG/ACT IN AERS
1.0000 | INHALATION_SPRAY | RESPIRATORY_TRACT | Status: DC | PRN
  Administered 2012-10-26: 2 via RESPIRATORY_TRACT
  Filled 2012-10-26: qty 6.7

## 2012-10-26 MED ORDER — PREDNISONE 20 MG PO TABS: 40.0000 mg | ORAL_TABLET | Freq: Every day | ORAL | Status: DC

## 2012-10-26 MED ORDER — IPRATROPIUM BROMIDE 0.02 % IN SOLN
RESPIRATORY_TRACT | Status: AC
  Filled 2012-10-26: qty 2.5

## 2012-10-26 MED ORDER — ALBUTEROL SULFATE (5 MG/ML) 0.5% IN NEBU
INHALATION_SOLUTION | RESPIRATORY_TRACT | Status: AC
  Filled 2012-10-26: qty 1

## 2012-10-26 MED ORDER — ALBUTEROL SULFATE (5 MG/ML) 0.5% IN NEBU
5.0000 mg | INHALATION_SOLUTION | Freq: Once | RESPIRATORY_TRACT | Status: AC
  Administered 2012-10-26: 5 mg via RESPIRATORY_TRACT

## 2012-10-26 NOTE — ED Notes (Signed)
Pt c/o cough x 2 weeks (after plane ride). Unable to cough anything up. Hoarse.

## 2012-10-26 NOTE — ED Provider Notes (Signed)
History    CSN: [id number] Arrival date & time 10/26/12  1321  First MD Initiated Contact with Patient 10/26/12 1347     Chief Complaint  Patient presents with  . Cough   (Consider location/radiation/quality/duration/timing/severity/associated sxs/prior Treatment) Patient is a 62 y.o. female presenting with cough. The history is provided by the patient.  Cough Cough characteristics:  Harsh, hoarse, non-productive and barking Severity:  Severe Onset quality:  Gradual Duration:  2 weeks Timing:  Constant Progression:  Worsening Chronicity:  New Smoker: no   Context: upper respiratory infection and weather changes   Context: not sick contacts   Context comment:  Was recently in [place] and since being home worsening cough and no hoarse Relieved by:  Nothing Worsened by:  Activity Ineffective treatments:  Beta-agonist inhaler (used inhaler once but out and unsure if it help) Associated symptoms: wheezing   Associated symptoms: no chest pain, no chills, no ear pain, no fever, no rash, no rhinorrhea, no shortness of breath, no sinus congestion and no weight loss   Risk factors: recent travel    Past Medical History  Diagnosis Date  . Asthma     EIB & seasonal allergen trigger  . Allergic rhinitis     seasonal  . Hypertension   . SOB (shortness of breath) on exertion   . Pain in shoulder     right side   Past Surgical History  Procedure Laterality Date  . Tubal ligation    . Colonoscopy  2003    Dr Juanda Chance, due  01/2012   Family History  Problem Relation Age of Onset  . Stroke Mother 32  . Dementia Mother   . Stroke Father 49  . Heart disease Brother 59    LAD stenting  . Diabetes Son     Type 1  . Colon cancer Maternal Uncle   . Prostate cancer Maternal Uncle   . Stroke Paternal Grandmother     mid 82s  . Hypertension Paternal Grandmother    History  Substance Use Topics  . Smoking status: Former Smoker    Types: Cigarettes    Quit date: 04/24/1976  .  Smokeless tobacco: Never Used  . Alcohol Use: 1.8 - 2.4 oz/week    3-4 Glasses of wine per week     Comment: wine   OB History   Grav Para Term Preterm Abortions TAB SAB Ect Mult Living                 Review of Systems  Constitutional: Negative for fever, chills and weight loss.  HENT: Negative for ear pain and rhinorrhea.   Respiratory: Positive for cough and wheezing. Negative for shortness of breath.   Cardiovascular: Negative for chest pain.  Skin: Negative for rash.  All other systems reviewed and are negative.    Allergies  Meperidine hcl; Metoprolol; Oxycodone-acetaminophen; and Sulfa antibiotics  Home Medications   Current Outpatient Rx  Name  Route  Sig  Dispense  Refill  . buPROPion (WELLBUTRIN XL) 150 MG 24 hr tablet   Oral   Take 150 mg by mouth daily. Rx'ed by Dr.Cottle         . FLUoxetine (PROZAC) 20 MG capsule   Oral   Take 20 mg by mouth daily.         Marland Kitchen lisinopril (PRINIVIL,ZESTRIL) 20 MG tablet   Oral   Take 20 mg by mouth. Take 2 pills in am and 1 pill a bedtime         .  NASONEX 50 MCG/ACT nasal spray      PLACE 2 SPRAYS INTO THE NOSE DAILY AS NEEDED.   17 g   PRN   . NEXIUM 40 MG capsule      TAKE 1 CAPSULE BY MOUTH DAILY   30 capsule   5   . VENTOLIN HFA 108 (90 BASE) MCG/ACT inhaler      INHALE 2 PUFFS BY MOUTH INTO LUNGS EVERY 6 HOURS AS NEEDED   18 each   PRN   . zolpidem (AMBIEN) 10 MG tablet   Oral   Take 10 mg by mouth at bedtime as needed.          BP 174/105  Pulse 79  Temp(Src) 98.7 F (37.1 C) (Oral)  Resp 20  Ht 5\' 4"  (1.626 m)  Wt 112 lb (50.803 kg)  BMI 19.22 kg/m2  SpO2 100% Physical Exam  Nursing note and vitals reviewed. Constitutional: She is oriented to person, place, and time. She appears well-developed and well-nourished. No distress.  HENT:  Head: Normocephalic and atraumatic.  Mouth/Throat: Oropharynx is clear and moist.  Eyes: Conjunctivae and EOM are normal. Pupils are equal, round,  and reactive to light.  Neck: Normal range of motion. Neck supple.  Cardiovascular: Normal rate, regular rhythm and intact distal pulses.   No murmur heard. Pulmonary/Chest: Effort normal. No respiratory distress. She has wheezes. She has rhonchi. She has no rales.  Wheezing and rhonchi left > right  Abdominal: Soft. She exhibits no distension. There is no tenderness. There is no rebound and no guarding.  Musculoskeletal: Normal range of motion. She exhibits no edema and no tenderness.  Neurological: She is alert and oriented to person, place, and time.  Skin: Skin is warm and dry. No rash noted. No erythema.  Psychiatric: She has a normal mood and affect. Her behavior is normal.    ED Course  Procedures (including critical care time) Labs Reviewed - No data to display Dg Chest 2 View  10/26/2012   *RADIOLOGY REPORT*  Clinical Data: Cough, wheezing  CHEST - 2 VIEW  Comparison: None.  Findings: The lungs are well-aerated.  Negative for focal airspace consolidation, edema or suspicious pulmonary nodule or mass. Probable calcified right hilar lymph node.  Aortic and mediastinal contours are within normal limits.  No acute osseous abnormality.  IMPRESSION: No acute cardiopulmonary process.   Original Report Authenticated By: Malachy Moan, M.D.   1. Bronchitis     MDM   Patient with 2 weeks of worsening cough that is nonproductive with wheezing on exam. Satting 100% on room air but is hypertensive today. Patient states she used an inhaler she had for exercise-induced asthma but it was almost gone and has not tried anything else. She denies infectious etiology such as nasal congestion, rhinorrhea, fever and no productive cough. The symptoms concerning her PE and no chest pain at this time. Feel most likely patient has bronchitis.  Albuterol and Atrovent given. Chest x-ray pending.  CXR without acute findings.  Started on albuterol and prednisone.    Gwyneth Sprout, MD 10/26/12 [phone]

## 2012-10-30 ENCOUNTER — Telehealth: Payer: Self-pay | Admitting: *Deleted

## 2012-10-30 NOTE — Telephone Encounter (Signed)
Please verify which agent she wants to use in place of the Nexium based on [facility]'s formulary and prescribe for 90 days.

## 2012-10-30 NOTE — Telephone Encounter (Signed)
Fax received stating that nexium is currently available to employees insured through [facility] health with a cp-pay of $0.00. Beginning April 24, 2012 Nexium will be changing from a $0.00 co-pay to $25.00 per month. As of April 24, 2012 pantoprazole will be available to patients with a co-pay of $0.00. Pt would like to try esomeprazole. Please Advise.

## 2012-10-31 MED ORDER — ESOMEPRAZOLE STRONTIUM 49.3 MG PO CPDR: 1.0000 | DELAYED_RELEASE_CAPSULE | Freq: Every day | ORAL | Status: DC

## 2012-10-31 NOTE — Telephone Encounter (Signed)
This is a generic for nexium a salt form capsule esomeprazole Strontium 49.3 mg. Per pharmacy it is the same as Nexium just a generic form at a cheaper cost..Please advise

## 2012-10-31 NOTE — Telephone Encounter (Signed)
Generic OK #90

## 2012-10-31 NOTE — Telephone Encounter (Signed)
Rx sent and Pt made aware of med change.

## 2012-11-08 ENCOUNTER — Encounter: Payer: Self-pay | Admitting: Internal Medicine

## 2013-01-16 ENCOUNTER — Other Ambulatory Visit: Payer: Self-pay | Admitting: General Practice

## 2013-01-16 ENCOUNTER — Encounter: Payer: Self-pay | Admitting: General Practice

## 2013-01-16 MED ORDER — ESOMEPRAZOLE STRONTIUM 49.3 MG PO CPDR: 1.0000 | DELAYED_RELEASE_CAPSULE | Freq: Every day | ORAL | Status: DC

## 2013-02-27 ENCOUNTER — Other Ambulatory Visit: Payer: Self-pay

## 2013-04-04 ENCOUNTER — Other Ambulatory Visit: Payer: Self-pay | Admitting: Internal Medicine

## 2013-04-23 ENCOUNTER — Other Ambulatory Visit: Payer: Self-pay | Admitting: *Deleted

## 2013-04-23 MED ORDER — LISINOPRIL 20 MG PO TABS: 20.0000 mg | ORAL_TABLET | Freq: Two times a day (BID) | ORAL | Status: DC

## 2013-08-19 ENCOUNTER — Other Ambulatory Visit: Payer: Self-pay

## 2013-08-19 NOTE — Telephone Encounter (Signed)
Medication list updated.

## 2013-08-27 ENCOUNTER — Other Ambulatory Visit (HOSPITAL_COMMUNITY): Payer: Self-pay | Admitting: Orthopedic Surgery

## 2013-08-27 DIAGNOSIS — M25511 Pain in right shoulder: Secondary | ICD-10-CM

## 2013-09-04 ENCOUNTER — Ambulatory Visit (HOSPITAL_COMMUNITY)
Admission: RE | Admit: 2013-09-04 | Discharge: 2013-09-04 | Disposition: A | Discharge status: Home / Self Care | Payer: 59 | Source: Ambulatory Visit | Attending: Orthopedic Surgery | Admitting: Orthopedic Surgery

## 2013-09-04 DIAGNOSIS — Y999 Unspecified external cause status: Secondary | ICD-10-CM | POA: Diagnosis not present

## 2013-09-04 DIAGNOSIS — Y939 Activity, unspecified: Secondary | ICD-10-CM | POA: Diagnosis not present

## 2013-09-04 DIAGNOSIS — M25511 Pain in right shoulder: Secondary | ICD-10-CM

## 2013-09-04 DIAGNOSIS — Y929 Unspecified place or not applicable: Secondary | ICD-10-CM | POA: Insufficient documentation

## 2013-09-04 DIAGNOSIS — X58XXXA Exposure to other specified factors, initial encounter: Secondary | ICD-10-CM | POA: Insufficient documentation

## 2013-09-04 DIAGNOSIS — M25519 Pain in unspecified shoulder: Secondary | ICD-10-CM | POA: Diagnosis present

## 2013-09-04 DIAGNOSIS — S43429A Sprain of unspecified rotator cuff capsule, initial encounter: Secondary | ICD-10-CM | POA: Diagnosis not present

## 2013-10-03 ENCOUNTER — Encounter (HOSPITAL_COMMUNITY): Payer: Self-pay | Admitting: Pharmacy Technician

## 2013-10-06 NOTE — Pre-Procedure Instructions (Signed)
Jessica [name]  10/06/2013   Your procedure is scheduled on:  Thursday, June 18th  Report to [facility] Admitting at 1145 AM.  Call this number if you have problems the morning of surgery: [phone]   Remember:   Do not eat food or drink liquids after midnight.   Take these medicines the morning of surgery with A SIP OF WATER: wellbutrin, Esomeprazole, prozac   Do not wear jewelry, make-up or nail polish.  Do not wear lotions, powders, or perfumes. You may wear deodorant.  Do not shave 48 hours prior to surgery. Men may shave face and neck.  Do not bring valuables to the hospital.  [facility] is not responsible for any belongings or valuables.               Contacts, dentures or bridgework may not be worn into surgery.  Leave suitcase in the car. After surgery it may be brought to your room.  For patients admitted to the hospital, discharge time is determined by your  treatment team.               Patients discharged the day of surgery will not be allowed to drive home.  Please read over the following fact sheets that you were given: Pain Booklet, Coughing and Deep Breathing and Surgical Site Infection Prevention [place] - Preparing for Surgery  Before surgery, you can play an important role.  Because skin is not sterile, your skin needs to be as free of germs as possible.  You can reduce the number of germs on you skin by washing with CHG (chlorahexidine gluconate) soap before surgery.  CHG is an antiseptic cleaner which kills germs and bonds with the skin to continue killing germs even after washing.  Please DO NOT use if you have an allergy to CHG or antibacterial soaps.  If your skin becomes reddened/irritated stop using the CHG and inform your nurse when you arrive at Short Stay.  Do not shave (including legs and underarms) for at least 48 hours prior to the first CHG shower.  You may shave your face.  Please follow these instructions carefully:   1.  Shower  with CHG Soap the night before surgery and the morning of Surgery.  2.  If you choose to wash your hair, wash your hair first as usual with your normal shampoo.  3.  After you shampoo, rinse your hair and body thoroughly to remove the shampoo.  4.  Use CHG as you would any other liquid soap.  You can apply CHG directly to the skin and wash gently with scrungie or a clean washcloth.  5.  Apply the CHG Soap to your body ONLY FROM THE NECK DOWN.  Do not use on open wounds or open sores.  Avoid contact with your eyes, ears, mouth and genitals (private parts).  Wash genitals (private parts) with your normal soap.  6.  Wash thoroughly, paying special attention to the area where your surgery will be performed.  7.  Thoroughly rinse your body with warm water from the neck down.  8.  DO NOT shower/wash with your normal soap after using and rinsing off the CHG Soap.  9.  Pat yourself dry with a clean towel.            10.  Wear clean pajamas.            11.  Place clean sheets on your bed the night of your first  shower and do not sleep with pets.  Day of Surgery  Do not apply any lotions/deoderants the morning of surgery.  Please wear clean clothes to the hospital/surgery center.

## 2013-10-07 ENCOUNTER — Encounter (HOSPITAL_COMMUNITY): Payer: Self-pay

## 2013-10-07 ENCOUNTER — Encounter (HOSPITAL_COMMUNITY)
Admission: RE | Admit: 2013-10-07 | Discharge: 2013-10-07 | Disposition: A | Discharge status: Home / Self Care | Payer: 59 | Source: Ambulatory Visit | Attending: Orthopedic Surgery | Admitting: Orthopedic Surgery

## 2013-10-07 ENCOUNTER — Other Ambulatory Visit: Payer: Self-pay

## 2013-10-07 DIAGNOSIS — M719 Bursopathy, unspecified: Secondary | ICD-10-CM | POA: Diagnosis not present

## 2013-10-07 DIAGNOSIS — F329 Major depressive disorder, single episode, unspecified: Secondary | ICD-10-CM | POA: Diagnosis not present

## 2013-10-07 DIAGNOSIS — S46819A Strain of other muscles, fascia and tendons at shoulder and upper arm level, unspecified arm, initial encounter: Secondary | ICD-10-CM | POA: Diagnosis not present

## 2013-10-07 DIAGNOSIS — Z01810 Encounter for preprocedural cardiovascular examination: Secondary | ICD-10-CM | POA: Diagnosis not present

## 2013-10-07 DIAGNOSIS — Y9373 Activity, racquet and hand sports: Secondary | ICD-10-CM | POA: Diagnosis not present

## 2013-10-07 DIAGNOSIS — J45909 Unspecified asthma, uncomplicated: Secondary | ICD-10-CM | POA: Diagnosis not present

## 2013-10-07 DIAGNOSIS — Z87891 Personal history of nicotine dependence: Secondary | ICD-10-CM | POA: Diagnosis not present

## 2013-10-07 DIAGNOSIS — S46919A Strain of unspecified muscle, fascia and tendon at shoulder and upper arm level, unspecified arm, initial encounter: Secondary | ICD-10-CM | POA: Diagnosis present

## 2013-10-07 DIAGNOSIS — Z01812 Encounter for preprocedural laboratory examination: Secondary | ICD-10-CM | POA: Diagnosis not present

## 2013-10-07 DIAGNOSIS — I10 Essential (primary) hypertension: Secondary | ICD-10-CM | POA: Diagnosis not present

## 2013-10-07 DIAGNOSIS — F3289 Other specified depressive episodes: Secondary | ICD-10-CM | POA: Diagnosis not present

## 2013-10-07 DIAGNOSIS — M67919 Unspecified disorder of synovium and tendon, unspecified shoulder: Secondary | ICD-10-CM | POA: Diagnosis not present

## 2013-10-07 DIAGNOSIS — K219 Gastro-esophageal reflux disease without esophagitis: Secondary | ICD-10-CM | POA: Diagnosis not present

## 2013-10-07 HISTORY — DX: Major depressive disorder, single episode, unspecified: F32.9

## 2013-10-07 LAB — CBC WITH DIFFERENTIAL/PLATELET
BASOS ABS: 0 10*3/uL (ref 0.0–0.1)
Basophils Relative: 0 % (ref 0–1)
EOS ABS: 0.2 10*3/uL (ref 0.0–0.7)
Eosinophils Relative: 2 % (ref 0–5)
HCT: 36.7 % (ref 36.0–46.0)
HEMOGLOBIN: 12.8 g/dL (ref 12.0–15.0)
Lymphocytes Relative: 26 % (ref 12–46)
Lymphs Abs: 2.9 10*3/uL (ref 0.7–4.0)
MCH: 31.8 pg (ref 26.0–34.0)
MCHC: 34.9 g/dL (ref 30.0–36.0)
MCV: 91.1 fL (ref 78.0–100.0)
MONO ABS: 0.9 10*3/uL (ref 0.1–1.0)
MONOS PCT: 8 % (ref 3–12)
NEUTROS ABS: 7.1 10*3/uL (ref 1.7–7.7)
NEUTROS PCT: 64 % (ref 43–77)
Platelets: 352 10*3/uL (ref 150–400)
RBC: 4.03 MIL/uL (ref 3.87–5.11)
RDW: 11.6 % (ref 11.5–15.5)
WBC: 11.2 10*3/uL — ABNORMAL HIGH (ref 4.0–10.5)

## 2013-10-07 LAB — COMPREHENSIVE METABOLIC PANEL
ALT: 19 U/L (ref 0–35)
AST: 22 U/L (ref 0–37)
Albumin: 3.8 g/dL (ref 3.5–5.2)
Alkaline Phosphatase: 63 U/L (ref 39–117)
BUN: 14 mg/dL (ref 6–23)
CHLORIDE: 100 meq/L (ref 96–112)
CO2: 29 mEq/L (ref 19–32)
CREATININE: 0.73 mg/dL (ref 0.50–1.10)
Calcium: 9.8 mg/dL (ref 8.4–10.5)
GFR calc Af Amer: >90 mL/min (ref >90)
GFR calc non Af Amer: 88 mL/min — ABNORMAL LOW (ref >90)
Glucose, Bld: 100 mg/dL — ABNORMAL HIGH (ref 70–99)
POTASSIUM: 3.8 meq/L (ref 3.7–5.3)
Sodium: 141 mEq/L (ref 137–147)
TOTAL PROTEIN: 7.2 g/dL (ref 6.0–8.3)
Total Bilirubin: 0.5 mg/dL (ref 0.3–1.2)

## 2013-10-07 LAB — PROTIME-INR
INR: 0.97 (ref 0.00–1.49)
PROTHROMBIN TIME: 12.7 s (ref 11.6–15.2)

## 2013-10-07 LAB — APTT: APTT: 31 s (ref 24–37)

## 2013-10-07 MED ORDER — LACTATED RINGERS IV SOLN: INTRAVENOUS | Status: DC

## 2013-10-08 MED ORDER — CEFAZOLIN SODIUM-DEXTROSE 2-3 GM-% IV SOLR
2.0000 g | INTRAVENOUS | Status: AC
  Administered 2013-10-09: 2 g via INTRAVENOUS
  Filled 2013-10-08: qty 50

## 2013-10-08 MED ORDER — CHLORHEXIDINE GLUCONATE 4 % EX LIQD
60.0000 mL | Freq: Once | CUTANEOUS | Status: DC
  Filled 2013-10-08: qty 60

## 2013-10-09 ENCOUNTER — Encounter (HOSPITAL_COMMUNITY): Payer: Self-pay | Admitting: Anesthesiology

## 2013-10-09 ENCOUNTER — Encounter (HOSPITAL_COMMUNITY)
Admission: RE | Disposition: A | Discharge status: Home / Self Care | Payer: Self-pay | Source: Ambulatory Visit | Attending: Orthopedic Surgery

## 2013-10-09 ENCOUNTER — Encounter (HOSPITAL_COMMUNITY): Payer: 59 | Admitting: Anesthesiology

## 2013-10-09 ENCOUNTER — Ambulatory Visit (HOSPITAL_COMMUNITY)
Admission: RE | Admit: 2013-10-09 | Discharge: 2013-10-09 | Disposition: A | Discharge status: Home / Self Care | Payer: 59 | Source: Ambulatory Visit | Attending: Orthopedic Surgery | Admitting: Orthopedic Surgery

## 2013-10-09 ENCOUNTER — Ambulatory Visit (HOSPITAL_COMMUNITY): Payer: 59 | Admitting: Anesthesiology

## 2013-10-09 DIAGNOSIS — M719 Bursopathy, unspecified: Secondary | ICD-10-CM | POA: Insufficient documentation

## 2013-10-09 DIAGNOSIS — Y9373 Activity, racquet and hand sports: Secondary | ICD-10-CM | POA: Insufficient documentation

## 2013-10-09 DIAGNOSIS — I10 Essential (primary) hypertension: Secondary | ICD-10-CM | POA: Insufficient documentation

## 2013-10-09 DIAGNOSIS — F3289 Other specified depressive episodes: Secondary | ICD-10-CM | POA: Insufficient documentation

## 2013-10-09 DIAGNOSIS — K219 Gastro-esophageal reflux disease without esophagitis: Secondary | ICD-10-CM | POA: Insufficient documentation

## 2013-10-09 DIAGNOSIS — S46919A Strain of unspecified muscle, fascia and tendon at shoulder and upper arm level, unspecified arm, initial encounter: Secondary | ICD-10-CM | POA: Insufficient documentation

## 2013-10-09 DIAGNOSIS — S46819A Strain of other muscles, fascia and tendons at shoulder and upper arm level, unspecified arm, initial encounter: Secondary | ICD-10-CM | POA: Insufficient documentation

## 2013-10-09 DIAGNOSIS — Z01810 Encounter for preprocedural cardiovascular examination: Secondary | ICD-10-CM | POA: Insufficient documentation

## 2013-10-09 DIAGNOSIS — Z01812 Encounter for preprocedural laboratory examination: Secondary | ICD-10-CM | POA: Insufficient documentation

## 2013-10-09 DIAGNOSIS — J45909 Unspecified asthma, uncomplicated: Secondary | ICD-10-CM | POA: Insufficient documentation

## 2013-10-09 DIAGNOSIS — M67919 Unspecified disorder of synovium and tendon, unspecified shoulder: Secondary | ICD-10-CM | POA: Insufficient documentation

## 2013-10-09 DIAGNOSIS — Z87891 Personal history of nicotine dependence: Secondary | ICD-10-CM | POA: Insufficient documentation

## 2013-10-09 DIAGNOSIS — F329 Major depressive disorder, single episode, unspecified: Secondary | ICD-10-CM | POA: Insufficient documentation

## 2013-10-09 HISTORY — PX: SHOULDER ARTHROSCOPY WITH OPEN ROTATOR CUFF REPAIR AND DISTAL CLAVICLE ACROMINECTOMY: SHX5683

## 2013-10-09 SURGERY — SHOULDER ARTHROSCOPY WITH OPEN ROTATOR CUFF REPAIR AND DISTAL CLAVICLE ACROMINECTOMY
Anesthesia: General | Site: Shoulder | Laterality: Right

## 2013-10-09 MED ORDER — GLYCOPYRROLATE 0.2 MG/ML IJ SOLN
INTRAMUSCULAR | Status: AC
  Filled 2013-10-09: qty 2

## 2013-10-09 MED ORDER — TRAMADOL HCL 50 MG PO TABS: 50.0000 mg | ORAL_TABLET | Freq: Four times a day (QID) | ORAL | Status: DC | PRN

## 2013-10-09 MED ORDER — FENTANYL CITRATE 0.05 MG/ML IJ SOLN
INTRAMUSCULAR | Status: AC
  Filled 2013-10-09: qty 5

## 2013-10-09 MED ORDER — DEXAMETHASONE SODIUM PHOSPHATE 4 MG/ML IJ SOLN
INTRAMUSCULAR | Status: AC
  Filled 2013-10-09: qty 1

## 2013-10-09 MED ORDER — BUPIVACAINE HCL (PF) 0.25 % IJ SOLN
INTRAMUSCULAR | Status: DC | PRN
  Administered 2013-10-09: 20 mL via PERINEURAL

## 2013-10-09 MED ORDER — PROPOFOL 10 MG/ML IV BOLUS
INTRAVENOUS | Status: AC
  Filled 2013-10-09: qty 20

## 2013-10-09 MED ORDER — EPHEDRINE SULFATE 50 MG/ML IJ SOLN
INTRAMUSCULAR | Status: DC | PRN
  Administered 2013-10-09 (×2): 5 mg via INTRAVENOUS
  Administered 2013-10-09: 10 mg via INTRAVENOUS
  Administered 2013-10-09 (×2): 5 mg via INTRAVENOUS

## 2013-10-09 MED ORDER — ALBUTEROL SULFATE (2.5 MG/3ML) 0.083% IN NEBU
INHALATION_SOLUTION | RESPIRATORY_TRACT | Status: AC
  Filled 2013-10-09: qty 3

## 2013-10-09 MED ORDER — METOCLOPRAMIDE HCL 5 MG/ML IJ SOLN: 10.0000 mg | Freq: Once | INTRAMUSCULAR | Status: DC | PRN

## 2013-10-09 MED ORDER — MIDAZOLAM HCL 2 MG/2ML IJ SOLN
INTRAMUSCULAR | Status: AC
  Administered 2013-10-09: 2 mg via INTRAVENOUS
  Filled 2013-10-09: qty 2

## 2013-10-09 MED ORDER — HYDROMORPHONE HCL 2 MG PO TABS: 2.0000 mg | ORAL_TABLET | ORAL | Status: DC | PRN

## 2013-10-09 MED ORDER — ONDANSETRON HCL 4 MG PO TABS: 4.0000 mg | ORAL_TABLET | Freq: Three times a day (TID) | ORAL | Status: DC | PRN

## 2013-10-09 MED ORDER — HYDROMORPHONE HCL PF 1 MG/ML IJ SOLN
INTRAMUSCULAR | Status: AC
  Filled 2013-10-09: qty 1

## 2013-10-09 MED ORDER — LACTATED RINGERS IV SOLN
INTRAVENOUS | Status: DC
  Administered 2013-10-09 (×2): via INTRAVENOUS

## 2013-10-09 MED ORDER — SODIUM CHLORIDE 0.9 % IR SOLN
Status: DC | PRN
  Administered 2013-10-09: 15000 mL

## 2013-10-09 MED ORDER — ONDANSETRON HCL 4 MG/2ML IJ SOLN
INTRAMUSCULAR | Status: AC
  Filled 2013-10-09: qty 2

## 2013-10-09 MED ORDER — DEXAMETHASONE SODIUM PHOSPHATE 4 MG/ML IJ SOLN
INTRAMUSCULAR | Status: DC | PRN
Start: 2013-10-09 — End: 2013-10-09
  Administered 2013-10-09: 4 mg via INTRAVENOUS

## 2013-10-09 MED ORDER — LIDOCAINE HCL (CARDIAC) 20 MG/ML IV SOLN
INTRAVENOUS | Status: AC
  Filled 2013-10-09: qty 5

## 2013-10-09 MED ORDER — LIDOCAINE HCL (CARDIAC) 20 MG/ML IV SOLN
INTRAVENOUS | Status: DC | PRN
Start: 2013-10-09 — End: 2013-10-09
  Administered 2013-10-09: 40 mg via INTRAVENOUS

## 2013-10-09 MED ORDER — DIAZEPAM 5 MG PO TABS: 2.5000 mg | ORAL_TABLET | Freq: Four times a day (QID) | ORAL | Status: DC | PRN

## 2013-10-09 MED ORDER — NEOSTIGMINE METHYLSULFATE 10 MG/10ML IV SOLN
INTRAVENOUS | Status: AC
  Filled 2013-10-09: qty 3

## 2013-10-09 MED ORDER — ONDANSETRON HCL 4 MG/2ML IJ SOLN
INTRAMUSCULAR | Status: DC | PRN
  Administered 2013-10-09: 4 mg via INTRAVENOUS

## 2013-10-09 MED ORDER — HYDROMORPHONE HCL PF 1 MG/ML IJ SOLN: 0.2500 mg | INTRAMUSCULAR | Status: DC | PRN

## 2013-10-09 MED ORDER — NEOSTIGMINE METHYLSULFATE 10 MG/10ML IV SOLN
INTRAVENOUS | Status: DC | PRN
  Administered 2013-10-09: 3 mg via INTRAVENOUS

## 2013-10-09 MED ORDER — PHENYLEPHRINE HCL 10 MG/ML IJ SOLN
INTRAMUSCULAR | Status: DC | PRN
Start: 2013-10-09 — End: 2013-10-09
  Administered 2013-10-09 (×2): 80 ug via INTRAVENOUS

## 2013-10-09 MED ORDER — ROCURONIUM BROMIDE 50 MG/5ML IV SOLN
INTRAVENOUS | Status: AC
  Filled 2013-10-09: qty 1

## 2013-10-09 MED ORDER — FENTANYL CITRATE 0.05 MG/ML IJ SOLN
INTRAMUSCULAR | Status: DC | PRN
  Administered 2013-10-09: 50 ug via INTRAVENOUS

## 2013-10-09 MED ORDER — PROPOFOL 10 MG/ML IV BOLUS
INTRAVENOUS | Status: DC | PRN
  Administered 2013-10-09: 150 mg via INTRAVENOUS

## 2013-10-09 MED ORDER — FENTANYL CITRATE 0.05 MG/ML IJ SOLN
INTRAMUSCULAR | Status: AC
  Administered 2013-10-09: 50 ug via INTRAVENOUS
  Filled 2013-10-09: qty 2

## 2013-10-09 MED ORDER — GLYCOPYRROLATE 0.2 MG/ML IJ SOLN
INTRAMUSCULAR | Status: DC | PRN
  Administered 2013-10-09: 0.4 mg via INTRAVENOUS

## 2013-10-09 MED ORDER — ALBUTEROL SULFATE (2.5 MG/3ML) 0.083% IN NEBU: 2.5000 mg | INHALATION_SOLUTION | Freq: Four times a day (QID) | RESPIRATORY_TRACT | Status: DC | PRN

## 2013-10-09 MED ORDER — ROCURONIUM BROMIDE 100 MG/10ML IV SOLN
INTRAVENOUS | Status: DC | PRN
  Administered 2013-10-09: 30 mg via INTRAVENOUS

## 2013-10-09 MED ORDER — OXYCODONE HCL 5 MG/5ML PO SOLN: 5.0000 mg | Freq: Once | ORAL | Status: DC | PRN

## 2013-10-09 MED ORDER — OXYCODONE HCL 5 MG PO TABS: 5.0000 mg | ORAL_TABLET | Freq: Once | ORAL | Status: DC | PRN

## 2013-10-09 SURGICAL SUPPLY — 51 items
ANCH SUT 2 CRKSRW FT 14X4.5 (Anchor) ×2 IMPLANT
ANCH SUT 2 SUTTK 14.5X3 BABSR (Orthopedic Implant) ×1 IMPLANT
ANCH SUT SWLK 19.1X4.75 VT (Anchor) ×1 IMPLANT
ANCHOR BIO-SUTURETAK 3X14.5 (Orthopedic Implant) ×2 IMPLANT
ANCHOR PEEK 4.75X19.1 SWLK C (Anchor) ×1 IMPLANT
ANCHOR PEEK CORKSCREW 4.5 (Anchor) ×4 IMPLANT
BLADE CUTTER GATOR 3.5 (BLADE) IMPLANT
BLADE GREAT WHITE 4.2 (BLADE) ×2 IMPLANT
BUR OVAL 4.0 (BURR) IMPLANT
CANNULA DRILOCK 5.0X75 (CANNULA) ×6 IMPLANT
CLSR STERI-STRIP ANTIMIC 1/2X4 (GAUZE/BANDAGES/DRESSINGS) ×2 IMPLANT
DRAPE INCISE 23X17 IOBAN STRL (DRAPES) ×1
DRAPE INCISE 23X17 STRL (DRAPES) ×1 IMPLANT
DRAPE INCISE IOBAN 23X17 STRL (DRAPES) ×1 IMPLANT
DRAPE STERI 35X30 U-POUCH (DRAPES) ×2 IMPLANT
DRAPE U-SHAPE 47X51 STRL (DRAPES) ×2 IMPLANT
DRSG EMULSION OIL 3X3 NADH (GAUZE/BANDAGES/DRESSINGS) ×2 IMPLANT
DRSG PAD ABDOMINAL 8X10 ST (GAUZE/BANDAGES/DRESSINGS) ×3 IMPLANT
DURAPREP 26ML APPLICATOR (WOUND CARE) ×2 IMPLANT
GLOVE BIO SURGEON STRL SZ7.5 (GLOVE) ×4 IMPLANT
GLOVE EUDERMIC 7 POWDERFREE (GLOVE) ×2 IMPLANT
GLOVE SS BIOGEL STRL SZ 7.5 (GLOVE) ×1 IMPLANT
GLOVE SUPERSENSE BIOGEL SZ 7.5 (GLOVE) ×1
GOWN STRL REUS W/ TWL LRG LVL3 (GOWN DISPOSABLE) ×1 IMPLANT
GOWN STRL REUS W/ TWL XL LVL3 (GOWN DISPOSABLE) ×2 IMPLANT
GOWN STRL REUS W/TWL LRG LVL3 (GOWN DISPOSABLE) ×2
GOWN STRL REUS W/TWL XL LVL3 (GOWN DISPOSABLE) ×4
KIT BASIN OR (CUSTOM PROCEDURE TRAY) ×2 IMPLANT
KIT ROOM TURNOVER OR (KITS) ×2 IMPLANT
MANIFOLD NEPTUNE II (INSTRUMENTS) ×2 IMPLANT
NDL SPNL 18GX3.5 QUINCKE PK (NEEDLE) ×1 IMPLANT
NEEDLE HYPO 25GX1X1/2 BEV (NEEDLE) IMPLANT
NEEDLE SPNL 18GX3.5 QUINCKE PK (NEEDLE) ×2 IMPLANT
NS IRRIG 1000ML POUR BTL (IV SOLUTION) ×2 IMPLANT
PACK SHOULDER (CUSTOM PROCEDURE TRAY) ×2 IMPLANT
PAD ARMBOARD 7.5X6 YLW CONV (MISCELLANEOUS) ×4 IMPLANT
SET ARTHROSCOPY TUBING (MISCELLANEOUS) ×2
SET ARTHROSCOPY TUBING LN (MISCELLANEOUS) ×1 IMPLANT
SLING ARM LRG ADULT FOAM STRAP (SOFTGOODS) ×2 IMPLANT
SLING ARM MED ADULT FOAM STRAP (SOFTGOODS) IMPLANT
SPONGE GAUZE 4X4 12PLY (GAUZE/BANDAGES/DRESSINGS) ×2 IMPLANT
SPONGE GAUZE 4X4 12PLY STER LF (GAUZE/BANDAGES/DRESSINGS) ×2 IMPLANT
STRIP CLOSURE SKIN 1/2X4 (GAUZE/BANDAGES/DRESSINGS) ×2 IMPLANT
SUT MNCRL AB 4-0 PS2 18 (SUTURE) ×2 IMPLANT
SUT RETRIEVER GRASP 30 DEG (SUTURE) ×1 IMPLANT
SYR CONTROL 10ML LL (SYRINGE) IMPLANT
TAPE CLOTH 4X10 WHT NS (GAUZE/BANDAGES/DRESSINGS) ×1 IMPLANT
TOWEL OR 17X24 6PK STRL BLUE (TOWEL DISPOSABLE) ×2 IMPLANT
TOWEL OR 17X26 10 PK STRL BLUE (TOWEL DISPOSABLE) ×2 IMPLANT
TUBE CONNECTING 12X1/4 (SUCTIONS) ×2 IMPLANT
WATER STERILE IRR 1000ML POUR (IV SOLUTION) ×2 IMPLANT

## 2013-10-09 NOTE — Progress Notes (Signed)
Orthopedic Tech Progress Note Patient Details:  Jessica [name] [birth year] [id number]  Ortho Devices Ortho Device/Splint Location: don joy Ortho Device/Splint Interventions: Application   Cammer, Theodoro Parma 10/09/2013, 4:11 PM

## 2013-10-09 NOTE — Anesthesia Procedure Notes (Addendum)
Anesthesia Regional Block:  Interscalene brachial plexus block  Pre-Anesthetic Checklist: ,, timeout performed, Correct Patient, Correct Site, Correct Laterality, Correct Procedure, Correct Position, site marked, Risks and benefits discussed,  Surgical consent,  Pre-op evaluation,  At surgeon's request and post-op pain management  Laterality: Right  Prep: chloraprep       Needles:   Needle Type: Other     Needle Length: 5cm 5 cm Needle Gauge: 21 and 21 G    Additional Needles:  Procedures: ultrasound guided (picture in chart) Interscalene brachial plexus block Narrative:  Start time: 10/09/2013 1:03 PM End time: 10/09/2013 1:10 PM Injection made incrementally with aspirations every 5 mL.  Performed by: Personally  Anesthesiologist: Nada Libman, MD  Additional Notes: Ultrasound guidance used to: id relevant anatomy, confirm needle position, local anesthetic spread, avoidance of vascular puncture. Picture saved. No complications. Block performed personally by Jessy Oto. Albertina Parr, MD     Procedure Name: Intubation Date/Time: 10/09/2013 1:47 PM Performed by: Kyung Rudd Pre-anesthesia Checklist: Patient identified, Emergency Drugs available, Suction available, Patient being monitored and Timeout performed Patient Re-evaluated:Patient Re-evaluated prior to inductionOxygen Delivery Method: Circle system utilized Preoxygenation: Pre-oxygenation with 100% oxygen Intubation Type: IV induction Ventilation: Mask ventilation without difficulty Laryngoscope Size: Mac and 3 Grade View: Grade I Tube type: Oral Tube size: 7.0 mm Number of attempts: 1 Airway Equipment and Method: Stylet Placement Confirmation: ETT inserted through vocal cords under direct vision,  positive ETCO2 and breath sounds checked- equal and bilateral Secured at: 20 cm Tube secured with: Tape Dental Injury: Teeth and Oropharynx as per pre-operative assessment

## 2013-10-09 NOTE — H&P (Signed)
Jessica [name]    Chief Complaint: RIGHT SHOULDER ROTATOR CUFF TEAR  HPI: The patient is a 63 y.o. female with large retracted acute on chronic rotator cuff tear  Past Medical History  Diagnosis Date  . Asthma     EIB & seasonal allergen trigger  . Allergic rhinitis     seasonal  . Hypertension   . SOB (shortness of breath) on exertion   . Pain in shoulder     right side  . Depression     Past Surgical History  Procedure Laterality Date  . Tubal ligation    . Colonoscopy  2003    Dr Olevia Perches, due  01/2012    Family History  Problem Relation Age of Onset  . Stroke Mother 61  . Dementia Mother   . Stroke Father 78  . Heart disease Brother 66    LAD stenting  . Diabetes Son     Type 1  . Colon cancer Maternal Uncle   . Prostate cancer Maternal Uncle   . Stroke Paternal Grandmother     mid 61s  . Hypertension Paternal Grandmother     Social History:  reports that she quit smoking about 37 years ago. Her smoking use included Cigarettes. She smoked 0.00 packs per day. She has never used smokeless tobacco. She reports that she drinks about 1.8 - 2.4 ounces of alcohol per week. She reports that she does not use illicit drugs.  Allergies:  Allergies  Allergen Reactions  . Meperidine Hcl     nausea  . Metoprolol     Nausea and inability to increase heart rate with cardiovascular exercise  . Oxycodone-Acetaminophen     nausea  . Sulfa Antibiotics     Nausea vomiting     Medications Prior to Admission  Medication Sig Dispense Refill  . ALPRAZolam (XANAX) 1 MG tablet Take 1 mg by mouth at bedtime as needed for sleep.      . Ascorbic Acid (VITAMIN C PO) Take 1 tablet by mouth daily.      Marland Kitchen buPROPion (WELLBUTRIN XL) 150 MG 24 hr tablet Take 150 mg by mouth daily. Rx'ed by Dr.Cottle      . CALCIUM PO Take 1 tablet by mouth daily.      . Esomeprazole Strontium 49.3 MG CPDR TAKE 1 CAPSULE BY MOUTH DAILY.  90 capsule  0  . FLUoxetine (PROZAC) 20 MG capsule Take 20 mg by mouth  daily.      Marland Kitchen lisinopril (PRINIVIL,ZESTRIL) 20 MG tablet Take 10 mg by mouth 2 (two) times daily.      Marland Kitchen MAGNESIUM PO Take 1 tablet by mouth daily.         Physical Exam: right shoulder with painful and restricted motion and weakness as noted at recent office visits  Vitals  Temp:  [98.8 F (37.1 C)] 98.8 F (37.1 C) (06/18 1206) Pulse Rate:  [80] 80 (06/18 1206) Resp:  [18] 18 (06/18 1206) BP: (127)/(82) 127/82 mmHg (06/18 1206) SpO2:  [98 %] 98 % (06/18 1206) Weight:  [48.478 kg (106 lb 14 oz)] 48.478 kg (106 lb 14 oz) (06/18 1206)  Assessment/Plan  Impression: RIGHT SHOULDER ROTATOR CUFF TEAR   Plan of Action: Procedure(s): RIGHT SHOULDER ARTHROSCOPY, SUBACROMIAL DECOMPRESSION, DISTAL CLAVICLE RESECTION, ROTATOR CUFF REPAIR  AND POSSIBLE OPEN ROTATOR CUFF REPAIR   Quincey Quesinberry M 10/09/2013, 12:58 PM

## 2013-10-09 NOTE — Anesthesia Postprocedure Evaluation (Signed)
Anesthesia Post Note  Patient: Jessica [name]  Procedure(s) Performed: Procedure(s) (LRB): RIGHT SHOULDER ARTHROSCOPY, SUBACROMIAL DECOMPRESSION, DISTAL CLAVICLE RESECTION, ROTATOR CUFF REPAIR  AND POSSIBLE OPEN ROTATOR CUFF REPAIR  (Right)  Anesthesia type: General  Patient location: PACU  Post pain: Pain level controlled and Adequate analgesia  Post assessment: Post-op Vital signs reviewed, Patient's Cardiovascular Status Stable, Respiratory Function Stable, Patent Airway and Pain level controlled  Last Vitals:  Filed Vitals:   10/09/13 1555  BP: 127/77  Pulse: 74  Temp:   Resp: 20    Post vital signs: Reviewed and stable  Level of consciousness: awake, alert  and oriented  Complications: No apparent anesthesia complications

## 2013-10-09 NOTE — Op Note (Signed)
10/09/2013  3:26 PM  PATIENT:   Jessica [name]  63 y.o. female  PRE-OPERATIVE DIAGNOSIS:  MASSIVE, ACUTE ON CHRONIC RIGHT SHOULDER ROTATOR CUFF TEAR   POST-OPERATIVE DIAGNOSIS:  same  PROCEDURE:  RSA, labral debridement, chondroplasty,  SAD, repair of acute portion of acute on chronic massive RCT  SURGEON:  Supple, Metta Clines M.D.  ASSISTANTS: Shuford pac   ANESTHESIA:   GET + ISB  EBL: min  SPECIMEN:  none  Drains: none   PATIENT DISPOSITION:  PACU - hemodynamically stable.    PLAN OF CARE: Discharge to home after PACU  Dictation# [id number]

## 2013-10-09 NOTE — Transfer of Care (Signed)
Immediate Anesthesia Transfer of Care Note  Patient: Jessica [name]  Procedure(s) Performed: Procedure(s): RIGHT SHOULDER ARTHROSCOPY, SUBACROMIAL DECOMPRESSION, DISTAL CLAVICLE RESECTION, ROTATOR CUFF REPAIR  AND POSSIBLE OPEN ROTATOR CUFF REPAIR  (Right)  Patient Location: PACU  Anesthesia Type:General and Regional  Level of Consciousness: awake, alert  and oriented  Airway & Oxygen Therapy: Patient Spontanous Breathing and Patient connected to nasal cannula oxygen  Post-op Assessment: Report given to PACU RN, Post -op Vital signs reviewed and stable and Patient moving all extremities X 4  Post vital signs: Reviewed and stable  Complications: No apparent anesthesia complications

## 2013-10-09 NOTE — Anesthesia Preprocedure Evaluation (Addendum)
Anesthesia Evaluation  Patient identified by MRN, date of birth, ID band Patient awake    Reviewed: Allergy & Precautions, H&P , NPO status , Patient's Chart, lab work & pertinent test results, reviewed documented beta blocker date and time   Airway Mallampati: II TM Distance: >3 FB Neck ROM: full    Dental  (+) Teeth Intact   Pulmonary shortness of breath and with exertion, asthma , former smoker,  breath sounds clear to auscultation        Cardiovascular hypertension, On Medications Rhythm:regular     Neuro/Psych PSYCHIATRIC DISORDERS negative neurological ROS     GI/Hepatic Neg liver ROS, GERD-  Medicated and Controlled,  Endo/Other  negative endocrine ROS  Renal/GU negative Renal ROS  negative genitourinary   Musculoskeletal   Abdominal   Peds  Hematology negative hematology ROS (+)   Anesthesia Other Findings See surgeon's H&P   Reproductive/Obstetrics negative OB ROS                         Anesthesia Physical Anesthesia Plan  ASA: II  Anesthesia Plan: General   Post-op Pain Management:    Induction: Intravenous  Airway Management Planned: Oral ETT  Additional Equipment:   Intra-op Plan:   Post-operative Plan: Extubation in OR  Informed Consent: I have reviewed the patients History and Physical, chart, labs and discussed the procedure including the risks, benefits and alternatives for the proposed anesthesia with the patient or authorized representative who has indicated his/her understanding and acceptance.   Dental Advisory Given  Plan Discussed with: CRNA and Surgeon  Anesthesia Plan Comments:         Anesthesia Quick Evaluation

## 2013-10-09 NOTE — Discharge Instructions (Signed)
° °  Kevin M. Supple, M.D., F.A.A.O.S. °Orthopaedic Surgery °Specializing in Arthroscopic and Reconstructive °Surgery of the Shoulder and Knee °[phone] °[address] - [place], [place] [address] - Fax [phone] ° °POST-OP SHOULDER ARTHROSCOPIC ROTATOR CUFF AND/OR LABRAL REPAIR INSTRUCTIONS ° °1. Call the office at [phone] to schedule your first post-op appointment 7-10 days from the date of your surgery. ° °2. Leave the steri-strips in place over your incisions when performing dressing changes and showering. You may remove your dressings and begin showering 72 hours from surgery. You can expect drainage that is clear to bloody in nature that occasionally will soak through your dressings. If this occurs go ahead and perform a dressing change. The drainage should lessen daily and when there is no drainage from your incisions feel free to go without a dressing. ° °3. Wear your sling/immobilizer at all times except to perform the exercises below or to occasionally let your arm dangle by your side to stretch your elbow. You also need to sleep in your sling immobilizer until instructed otherwise. ° °4. Range of motion to your elbow, wrist, and hand are encouraged 3-5 times daily. Exercise to your hand and fingers helps to reduce swelling you may experience. ° °5. Utilize ice to the shoulder 3-4 times minimum a day and additionally if you are experiencing pain. ° °6. You may one-armed drive when safely off of narcotics and muscle relaxants. You may use your hand that is in the sling to support the steering wheel only. However, should it be your right arm that is in the sling it is not to be used for gear shifting in a manual transmission. ° °7. If you had a block pre-operatively to provide post-op pain relief you may want to go ahead and begin utilizing your pain meds as your arm begins to wake up. Blocks can sometimes last up to 16-18 hours. If you are still pain-free prior to going to bed you may  want to strongly consider taking a pain medication to avoid being awakened in the night with the onset of pain. A muscle relaxant is also provided for you should you experience muscle spasms. It is recommended that if you are experiencing pain that your pain medication alone is not controlling, add the muscle relaxant along with the pain medication which can give additional pain relief. The first one to two days is generally the most severe of your pain and then should gradually decrease. As your pain lessens it is recommended that you decrease your use of the pain medications to an "as needed basis" only and to always comply with the recommended dosages of the pain medications. ° °8. Pain medications can produce constipation along with their use. If you experience this, the use of an over the counter stool softener or laxative daily is recommended.  ° °9. For additional questions or concerns, please do not hesitate to call the office. If after hours there is an answering service to forward your concerns to the physician on call. ° °POST-OP EXERCISES ° °Pendulum Exercises ° °Perform pendulum exercises while standing and bending at the waist. Support your uninvolved arm on a table or chair and allow your operated arm to hang freely. Make sure to do these exercises passively - not using you shoulder muscle. ° °Repeat 20 times. Do 3 sessions per day. ° ° °

## 2013-10-10 NOTE — Op Note (Signed)
[name], [name]                   ACCOUNT NO.:  [id number]  MEDICAL RECORD NO.:  [id number]  LOCATION:  [facility]                         FACILITY:  [facility]  PHYSICIAN:  Metta Clines. Supple, M.D.  DATE OF BIRTH:  Sep 20, 1950  DATE OF PROCEDURE:  10/09/2013 DATE OF DISCHARGE:  10/09/2013                              OPERATIVE REPORT   PREOPERATIVE DIAGNOSIS:  Massive acute on chronic right shoulder rotator cuff tear.  POSTOPERATIVE DIAGNOSIS:  Massive acute on chronic right shoulder rotator cuff tear.  PROCEDURE: 1. Right shoulder examination under anesthesia. 2. Right shoulder glenohumeral joint diagnostic arthroscopy. 3. Labral debridement. 4. Chondroplasty of the humeral head and glenoid. 5. Debridement of the retained proximal stump of the previously     ruptured long head biceps tendon. 6. Arthroscopic subacromial decompression and bursectomy. 7. Arthroscopic repair of the acute portion of an acute on chronic     massive rotator cuff tear.  SURGEON:  Metta Clines. Supple, M.D.  Terrence DupontOlivia Mackie A. Shuford, PA-C.  ANESTHESIA:  General endotracheal as well as an interscalene block.  ESTIMATED BLOOD LOSS:  Minimal.  DRAINS:  None.  HISTORY:  Jessica [name] is a 63 year old female, avid tennis player who reports 15 years ago sustaining an injury to the shoulder with apparent rupture of long head biceps tendon and some persistent weakness, but has done well functionally and recently had increasing right shoulder pain with significant weakness, loss of functional capabilities, and a recent MRI scan had shown evidence for a chronic retracted scarred, tear of the subscapularis, as well as an apparent acute tear of the supra and infraspinatus.  Due to her persistent pain, weakness, and functional limitations, she is brought to the operating room at this time for planned right shoulder arthroscopy with repair of the rotator cuff as indicated.  Preoperatively, I counseled Ms. Heffington regarding  treatment options, and risks versus benefits thereof.  Possible surgical complications were all reviewed including potential for bleeding, infection, neurovascular injury, persistent pain, loss of motion, anesthetic complications, recurrence of rotator cuff tear, in fact that the tear may indeed be irreparable.  She understands and accepts and agrees with our planned procedure.  PROCEDURE IN DETAIL:  After undergoing routine preop evaluation, the patient received prophylactic antibiotics and an interscalene block was established in the holding area by the Anesthesia Department.  Placed supine on the operating table, underwent smooth induction of general endotracheal anesthesia.  Placed into left lateral decubitus position on the beanbag and appropriately padded and protected.  Right shoulder examination under anesthesia revealed full motion.  No instability patterns were noted.  Right arm was then suspended at 70 degrees of abduction with 10 pounds of traction.  The right shoulder girdle region was sterilely prepped and draped in standard fashion.  Time-out was called.  A posterior portal was established at the glenohumeral joint. Anterior portal was established under direct visualization.  The articular surfaces showed some mild diffuse grade 1 chondromalacia of both the humeral head and glenoid.  These areas were gently debrided with shaver.  There was extensive degenerative tearing of the superior half of the labrum and also some residual proximal portion of a previously  ruptured long head biceps tendon.  This residual proximal stump was debrided away as was the superior labral tear.  Also found that the subscapular showed evidence for a chronic retracted tear scarified beyond the margin of the glenoid and made some initial attempts at mobilizing this tissue, but it clearly was nonviable and noncontractile.  The elements of the cuff superiorly and posteriorly, however, could have a  more recent tear pattern.  Given these findings, we went ahead and removed the arthroscope from the glenohumeral joint. The arm was dropped down to 30 degrees of abduction.  Arthroscope introduced into the subacromial space to the posterior portal and direct lateral portal was established in the subacromial space.  Abundant dense bursal tissue and multiple adhesions were encountered and these were all divided and excised with a combination of shaver and a Stryker wand. The wand was then used to remove the periosteum from the undersurface of the anterior acromion, although I did not divide the coracoacromial ligaments and the overall bony contour was type 1 so no bone work was necessary.  In addition, we left the [facility] joint intact.  I did complete the bursectomy and identified the torn margin of the supra and infraspinatus.  This was then mobilized, both the articular and bursal sides were significantly retracted.  We were able to mobilize this tissue and we were able to bring it back across the footprint of the tuberosity, although I should mention that there was some evidence for synovitis and erosion of the articular surface adjacent to the greater tuberosity and these changes were suggestive of an early cuff tear arthropathy.  Once we were satisfied with the mobilization of the rotator cuff and overall tissue appeared to have reasonable viability, we elected to proceed with repair of the supra and infraspinatus.  I prepared the tuberosity removing soft tissue and gently abrading the bone from the level of bicipital groove posteriorly footprint with approximately 4 cm.  Accessory portal device was established and through lateral margin of the acromion placed 2 Arthrex PEEK corkscrew suture anchors.  These suture limbs were then shuttled through the free margin of the rotator cuff in a horizontal mattress pattern, then tied with sliding locking knots followed by multiple overhand throws  from posterior to anterior to allowed excellent reapposition of the supra and infraspinatus back to their footprints on the tuberosity.  We then created "suture bridge" with 2 swivel lock suture anchors, which nicely compressed the margin of the rotator cuff against the bony bed of tuberosity.  The overall construct was much to our satisfaction.  Suture limbs were all clipped.  Bursectomy was completed.  Hemostasis was obtained.  Fluid and instruments were removed.  The portals were closed with Monocryl and Steri-Strips.  Dry dressing taped at the right shoulder and was placed in a sling.  The patient was awakened, extubated, and taken to the recovery room in stable condition.  Tracy A. Shuford, PA-C was used as an Environmental consultant throughout this case, essential for help with positioning of the patient, positioning of the extremity, management of the arthroscopic equipment, tissue manipulation, suture management, wound closure, and intraoperative decision making.     Metta Clines. Supple, M.D.     KMS/MEDQ  D:  10/09/2013  T:  10/10/2013  Job:  [id number]

## 2013-10-13 ENCOUNTER — Encounter (HOSPITAL_COMMUNITY): Payer: Self-pay | Admitting: Orthopedic Surgery

## 2013-10-14 ENCOUNTER — Ambulatory Visit: Payer: 59 | Attending: Orthopedic Surgery | Admitting: Physical Therapy

## 2013-10-23 ENCOUNTER — Ambulatory Visit: Payer: 59 | Attending: Internal Medicine | Admitting: Physical Therapy

## 2013-10-23 DIAGNOSIS — M25619 Stiffness of unspecified shoulder, not elsewhere classified: Secondary | ICD-10-CM | POA: Insufficient documentation

## 2013-10-23 DIAGNOSIS — I10 Essential (primary) hypertension: Secondary | ICD-10-CM | POA: Insufficient documentation

## 2013-10-23 DIAGNOSIS — R5381 Other malaise: Secondary | ICD-10-CM | POA: Insufficient documentation

## 2013-10-23 DIAGNOSIS — Z9889 Other specified postprocedural states: Secondary | ICD-10-CM | POA: Insufficient documentation

## 2013-10-23 DIAGNOSIS — IMO0001 Reserved for inherently not codable concepts without codable children: Secondary | ICD-10-CM | POA: Insufficient documentation

## 2013-10-23 DIAGNOSIS — M6281 Muscle weakness (generalized): Secondary | ICD-10-CM | POA: Insufficient documentation

## 2013-10-23 DIAGNOSIS — M25519 Pain in unspecified shoulder: Secondary | ICD-10-CM | POA: Insufficient documentation

## 2013-10-29 ENCOUNTER — Ambulatory Visit: Payer: 59

## 2013-10-29 DIAGNOSIS — I10 Essential (primary) hypertension: Secondary | ICD-10-CM | POA: Diagnosis not present

## 2013-10-29 DIAGNOSIS — IMO0001 Reserved for inherently not codable concepts without codable children: Secondary | ICD-10-CM | POA: Diagnosis not present

## 2013-10-29 DIAGNOSIS — M25619 Stiffness of unspecified shoulder, not elsewhere classified: Secondary | ICD-10-CM | POA: Diagnosis not present

## 2013-10-29 DIAGNOSIS — Z9889 Other specified postprocedural states: Secondary | ICD-10-CM | POA: Diagnosis not present

## 2013-10-29 DIAGNOSIS — R5381 Other malaise: Secondary | ICD-10-CM | POA: Diagnosis not present

## 2013-10-29 DIAGNOSIS — M6281 Muscle weakness (generalized): Secondary | ICD-10-CM | POA: Diagnosis not present

## 2013-10-29 DIAGNOSIS — M25519 Pain in unspecified shoulder: Secondary | ICD-10-CM | POA: Diagnosis not present

## 2013-10-30 ENCOUNTER — Ambulatory Visit: Payer: 59

## 2013-10-30 DIAGNOSIS — IMO0001 Reserved for inherently not codable concepts without codable children: Secondary | ICD-10-CM | POA: Diagnosis not present

## 2013-10-31 ENCOUNTER — Ambulatory Visit: Payer: 59 | Admitting: Physical Therapy

## 2013-11-03 ENCOUNTER — Ambulatory Visit: Payer: 59

## 2013-11-03 DIAGNOSIS — IMO0001 Reserved for inherently not codable concepts without codable children: Secondary | ICD-10-CM | POA: Diagnosis not present

## 2013-11-05 ENCOUNTER — Ambulatory Visit: Payer: 59

## 2013-11-05 DIAGNOSIS — IMO0001 Reserved for inherently not codable concepts without codable children: Secondary | ICD-10-CM | POA: Diagnosis not present

## 2013-11-10 ENCOUNTER — Ambulatory Visit: Payer: 59

## 2013-11-10 DIAGNOSIS — IMO0001 Reserved for inherently not codable concepts without codable children: Secondary | ICD-10-CM | POA: Diagnosis not present

## 2013-11-12 ENCOUNTER — Ambulatory Visit: Payer: 59

## 2013-11-14 ENCOUNTER — Ambulatory Visit: Payer: 59 | Admitting: Physical Therapy

## 2013-11-14 DIAGNOSIS — IMO0001 Reserved for inherently not codable concepts without codable children: Secondary | ICD-10-CM | POA: Diagnosis not present

## 2013-11-17 ENCOUNTER — Ambulatory Visit: Payer: 59

## 2013-11-17 DIAGNOSIS — IMO0001 Reserved for inherently not codable concepts without codable children: Secondary | ICD-10-CM | POA: Diagnosis not present

## 2013-11-19 ENCOUNTER — Ambulatory Visit: Payer: 59 | Admitting: Physical Therapy

## 2013-11-19 DIAGNOSIS — IMO0001 Reserved for inherently not codable concepts without codable children: Secondary | ICD-10-CM | POA: Diagnosis not present

## 2013-11-24 ENCOUNTER — Ambulatory Visit: Payer: 59 | Attending: Orthopedic Surgery

## 2013-11-24 DIAGNOSIS — I10 Essential (primary) hypertension: Secondary | ICD-10-CM | POA: Diagnosis not present

## 2013-11-24 DIAGNOSIS — M6281 Muscle weakness (generalized): Secondary | ICD-10-CM | POA: Diagnosis not present

## 2013-11-24 DIAGNOSIS — M25519 Pain in unspecified shoulder: Secondary | ICD-10-CM | POA: Diagnosis not present

## 2013-11-24 DIAGNOSIS — M25619 Stiffness of unspecified shoulder, not elsewhere classified: Secondary | ICD-10-CM | POA: Insufficient documentation

## 2013-11-24 DIAGNOSIS — IMO0001 Reserved for inherently not codable concepts without codable children: Secondary | ICD-10-CM | POA: Insufficient documentation

## 2013-11-24 DIAGNOSIS — R5381 Other malaise: Secondary | ICD-10-CM | POA: Diagnosis not present

## 2013-11-24 DIAGNOSIS — Z9889 Other specified postprocedural states: Secondary | ICD-10-CM | POA: Diagnosis not present

## 2013-11-25 ENCOUNTER — Other Ambulatory Visit: Payer: Self-pay | Admitting: Internal Medicine

## 2013-11-26 ENCOUNTER — Ambulatory Visit: Payer: 59

## 2013-11-26 DIAGNOSIS — IMO0001 Reserved for inherently not codable concepts without codable children: Secondary | ICD-10-CM | POA: Diagnosis not present

## 2013-11-27 ENCOUNTER — Encounter: Payer: Self-pay | Admitting: Internal Medicine

## 2013-12-01 ENCOUNTER — Ambulatory Visit: Payer: 59 | Admitting: Physical Therapy

## 2013-12-01 DIAGNOSIS — IMO0001 Reserved for inherently not codable concepts without codable children: Secondary | ICD-10-CM | POA: Diagnosis not present

## 2013-12-02 ENCOUNTER — Other Ambulatory Visit: Payer: Self-pay

## 2013-12-02 MED ORDER — ESOMEPRAZOLE STRONTIUM 49.3 MG PO CPDR: DELAYED_RELEASE_CAPSULE | ORAL | Status: DC

## 2013-12-02 MED ORDER — ESOMEPRAZOLE MAGNESIUM 40 MG PO CPDR: 40.0000 mg | DELAYED_RELEASE_CAPSULE | Freq: Every day | ORAL | Status: DC

## 2013-12-02 NOTE — Telephone Encounter (Signed)
Esomeprazole Strontium 49.3 mg cap has been discontinued. Pharmacy requesting a new script for generic Nexium 40 mg. Please advise.

## 2013-12-02 NOTE — Telephone Encounter (Signed)
OK X 3 mos 

## 2013-12-05 ENCOUNTER — Ambulatory Visit: Payer: 59 | Admitting: Physical Therapy

## 2013-12-08 ENCOUNTER — Other Ambulatory Visit: Payer: Self-pay | Admitting: Internal Medicine

## 2013-12-08 ENCOUNTER — Ambulatory Visit: Payer: 59

## 2013-12-08 DIAGNOSIS — IMO0001 Reserved for inherently not codable concepts without codable children: Secondary | ICD-10-CM | POA: Diagnosis not present

## 2013-12-08 NOTE — Telephone Encounter (Signed)
I do not see this on present medication list. Please advise.

## 2013-12-08 NOTE — Telephone Encounter (Signed)
OK X1, R X 2 

## 2013-12-10 ENCOUNTER — Ambulatory Visit: Payer: 59 | Admitting: Physical Therapy

## 2013-12-10 DIAGNOSIS — IMO0001 Reserved for inherently not codable concepts without codable children: Secondary | ICD-10-CM | POA: Diagnosis not present

## 2013-12-11 ENCOUNTER — Ambulatory Visit: Payer: 59 | Admitting: Physical Therapy

## 2013-12-11 DIAGNOSIS — IMO0001 Reserved for inherently not codable concepts without codable children: Secondary | ICD-10-CM | POA: Diagnosis not present

## 2013-12-15 ENCOUNTER — Ambulatory Visit: Payer: 59

## 2013-12-15 DIAGNOSIS — IMO0001 Reserved for inherently not codable concepts without codable children: Secondary | ICD-10-CM | POA: Diagnosis not present

## 2013-12-17 ENCOUNTER — Ambulatory Visit: Payer: 59 | Admitting: Physical Therapy

## 2013-12-17 DIAGNOSIS — IMO0001 Reserved for inherently not codable concepts without codable children: Secondary | ICD-10-CM | POA: Diagnosis not present

## 2013-12-19 ENCOUNTER — Ambulatory Visit: Payer: 59 | Admitting: Physical Therapy

## 2013-12-22 ENCOUNTER — Ambulatory Visit: Payer: 59 | Admitting: Physical Therapy

## 2013-12-22 DIAGNOSIS — IMO0001 Reserved for inherently not codable concepts without codable children: Secondary | ICD-10-CM | POA: Diagnosis not present

## 2013-12-24 ENCOUNTER — Ambulatory Visit: Payer: 59 | Attending: Orthopedic Surgery

## 2013-12-24 DIAGNOSIS — R5381 Other malaise: Secondary | ICD-10-CM | POA: Diagnosis not present

## 2013-12-24 DIAGNOSIS — M25619 Stiffness of unspecified shoulder, not elsewhere classified: Secondary | ICD-10-CM | POA: Diagnosis not present

## 2013-12-24 DIAGNOSIS — Z9889 Other specified postprocedural states: Secondary | ICD-10-CM | POA: Insufficient documentation

## 2013-12-24 DIAGNOSIS — I10 Essential (primary) hypertension: Secondary | ICD-10-CM | POA: Insufficient documentation

## 2013-12-24 DIAGNOSIS — M25519 Pain in unspecified shoulder: Secondary | ICD-10-CM | POA: Insufficient documentation

## 2013-12-24 DIAGNOSIS — IMO0001 Reserved for inherently not codable concepts without codable children: Secondary | ICD-10-CM | POA: Diagnosis present

## 2013-12-24 DIAGNOSIS — M6281 Muscle weakness (generalized): Secondary | ICD-10-CM | POA: Diagnosis not present

## 2013-12-26 ENCOUNTER — Ambulatory Visit: Payer: 59 | Admitting: Physical Therapy

## 2013-12-26 DIAGNOSIS — IMO0001 Reserved for inherently not codable concepts without codable children: Secondary | ICD-10-CM | POA: Diagnosis not present

## 2013-12-30 ENCOUNTER — Other Ambulatory Visit: Payer: Self-pay | Admitting: Internal Medicine

## 2013-12-31 ENCOUNTER — Ambulatory Visit: Payer: 59 | Admitting: Physical Therapy

## 2013-12-31 DIAGNOSIS — IMO0001 Reserved for inherently not codable concepts without codable children: Secondary | ICD-10-CM | POA: Diagnosis not present

## 2014-01-01 ENCOUNTER — Ambulatory Visit: Payer: 59 | Admitting: Physical Therapy

## 2014-01-01 DIAGNOSIS — IMO0001 Reserved for inherently not codable concepts without codable children: Secondary | ICD-10-CM | POA: Diagnosis not present

## 2014-01-05 ENCOUNTER — Ambulatory Visit: Payer: 59

## 2014-01-05 DIAGNOSIS — IMO0001 Reserved for inherently not codable concepts without codable children: Secondary | ICD-10-CM | POA: Diagnosis not present

## 2014-01-07 ENCOUNTER — Ambulatory Visit: Payer: 59

## 2014-01-07 DIAGNOSIS — IMO0001 Reserved for inherently not codable concepts without codable children: Secondary | ICD-10-CM | POA: Diagnosis not present

## 2014-01-12 ENCOUNTER — Encounter: Payer: 59 | Admitting: Physical Therapy

## 2014-01-14 ENCOUNTER — Ambulatory Visit: Payer: 59 | Admitting: Physical Therapy

## 2014-01-14 ENCOUNTER — Encounter: Payer: 59 | Admitting: Physical Therapy

## 2014-01-14 DIAGNOSIS — IMO0001 Reserved for inherently not codable concepts without codable children: Secondary | ICD-10-CM | POA: Diagnosis not present

## 2014-01-15 ENCOUNTER — Ambulatory Visit: Payer: 59

## 2014-01-16 ENCOUNTER — Ambulatory Visit: Payer: 59 | Admitting: Physical Therapy

## 2014-01-16 DIAGNOSIS — IMO0001 Reserved for inherently not codable concepts without codable children: Secondary | ICD-10-CM | POA: Diagnosis not present

## 2014-01-20 ENCOUNTER — Ambulatory Visit: Payer: 59 | Admitting: Physical Therapy

## 2014-01-20 DIAGNOSIS — IMO0001 Reserved for inherently not codable concepts without codable children: Secondary | ICD-10-CM | POA: Diagnosis not present

## 2014-01-22 ENCOUNTER — Encounter: Payer: 59 | Admitting: Physical Therapy

## 2014-01-28 ENCOUNTER — Ambulatory Visit: Payer: 59 | Admitting: Physical Therapy

## 2014-01-30 ENCOUNTER — Ambulatory Visit: Payer: 59 | Attending: Orthopedic Surgery | Admitting: Physical Therapy

## 2014-01-30 DIAGNOSIS — M25511 Pain in right shoulder: Secondary | ICD-10-CM | POA: Insufficient documentation

## 2014-01-30 DIAGNOSIS — Z9889 Other specified postprocedural states: Secondary | ICD-10-CM | POA: Insufficient documentation

## 2014-01-30 DIAGNOSIS — R5381 Other malaise: Secondary | ICD-10-CM | POA: Insufficient documentation

## 2014-01-30 DIAGNOSIS — I10 Essential (primary) hypertension: Secondary | ICD-10-CM | POA: Insufficient documentation

## 2014-01-30 DIAGNOSIS — M6281 Muscle weakness (generalized): Secondary | ICD-10-CM | POA: Insufficient documentation

## 2014-01-30 DIAGNOSIS — M25611 Stiffness of right shoulder, not elsewhere classified: Secondary | ICD-10-CM | POA: Insufficient documentation

## 2014-02-02 ENCOUNTER — Ambulatory Visit: Payer: 59

## 2014-02-02 DIAGNOSIS — M25611 Stiffness of right shoulder, not elsewhere classified: Secondary | ICD-10-CM | POA: Diagnosis not present

## 2014-02-02 DIAGNOSIS — Z9889 Other specified postprocedural states: Secondary | ICD-10-CM | POA: Diagnosis not present

## 2014-02-02 DIAGNOSIS — I10 Essential (primary) hypertension: Secondary | ICD-10-CM | POA: Diagnosis not present

## 2014-02-02 DIAGNOSIS — M6281 Muscle weakness (generalized): Secondary | ICD-10-CM | POA: Diagnosis not present

## 2014-02-02 DIAGNOSIS — M25511 Pain in right shoulder: Secondary | ICD-10-CM | POA: Diagnosis present

## 2014-02-02 DIAGNOSIS — R5381 Other malaise: Secondary | ICD-10-CM | POA: Diagnosis not present

## 2014-02-04 ENCOUNTER — Ambulatory Visit: Payer: 59

## 2014-02-05 ENCOUNTER — Ambulatory Visit: Payer: 59

## 2014-02-05 DIAGNOSIS — M25511 Pain in right shoulder: Secondary | ICD-10-CM | POA: Diagnosis not present

## 2014-02-09 ENCOUNTER — Ambulatory Visit: Payer: 59 | Admitting: Physical Therapy

## 2014-02-09 DIAGNOSIS — M25511 Pain in right shoulder: Secondary | ICD-10-CM | POA: Diagnosis not present

## 2014-02-11 ENCOUNTER — Ambulatory Visit: Payer: 59 | Admitting: Physical Therapy

## 2014-02-11 DIAGNOSIS — M25511 Pain in right shoulder: Secondary | ICD-10-CM | POA: Diagnosis not present

## 2014-03-05 ENCOUNTER — Other Ambulatory Visit (HOSPITAL_COMMUNITY): Payer: Self-pay | Admitting: Obstetrics and Gynecology

## 2014-03-05 DIAGNOSIS — Z1231 Encounter for screening mammogram for malignant neoplasm of breast: Secondary | ICD-10-CM

## 2014-03-09 ENCOUNTER — Other Ambulatory Visit: Payer: Self-pay | Admitting: Internal Medicine

## 2014-03-09 ENCOUNTER — Encounter: Payer: Self-pay | Admitting: Internal Medicine

## 2014-03-11 ENCOUNTER — Ambulatory Visit: Payer: 59 | Admitting: Internal Medicine

## 2014-03-13 ENCOUNTER — Ambulatory Visit: Payer: 59 | Admitting: Internal Medicine

## 2014-03-23 ENCOUNTER — Other Ambulatory Visit: Payer: Self-pay | Admitting: Internal Medicine

## 2014-03-23 ENCOUNTER — Ambulatory Visit: Payer: 59 | Admitting: Internal Medicine

## 2014-04-02 ENCOUNTER — Ambulatory Visit (HOSPITAL_COMMUNITY): Payer: 59

## 2014-05-18 ENCOUNTER — Ambulatory Visit (INDEPENDENT_AMBULATORY_CARE_PROVIDER_SITE_OTHER): Payer: 59 | Admitting: Internal Medicine

## 2014-05-18 ENCOUNTER — Other Ambulatory Visit (INDEPENDENT_AMBULATORY_CARE_PROVIDER_SITE_OTHER): Payer: 59

## 2014-05-18 ENCOUNTER — Ambulatory Visit: Payer: 59 | Admitting: Internal Medicine

## 2014-05-18 ENCOUNTER — Encounter: Payer: Self-pay | Admitting: Internal Medicine

## 2014-05-18 VITALS — BP 126/80 | HR 69 | Temp 98.2°F | Resp 14 | Ht 64.0 in | Wt 108.0 lb

## 2014-05-18 DIAGNOSIS — Z0189 Encounter for other specified special examinations: Secondary | ICD-10-CM

## 2014-05-18 DIAGNOSIS — Z0000 Encounter for general adult medical examination without abnormal findings: Secondary | ICD-10-CM

## 2014-05-18 DIAGNOSIS — Z23 Encounter for immunization: Secondary | ICD-10-CM

## 2014-05-18 DIAGNOSIS — I10 Essential (primary) hypertension: Secondary | ICD-10-CM

## 2014-05-18 LAB — CBC WITH DIFFERENTIAL/PLATELET
BASOS ABS: 0 10*3/uL (ref 0.0–0.1)
Basophils Relative: 0.6 % (ref 0.0–3.0)
EOS ABS: 0.1 10*3/uL (ref 0.0–0.7)
Eosinophils Relative: 1.7 % (ref 0.0–5.0)
HCT: 36.1 % (ref 36.0–46.0)
HEMOGLOBIN: 12.3 g/dL (ref 12.0–15.0)
LYMPHS PCT: 39.4 % (ref 12.0–46.0)
Lymphs Abs: 2.4 10*3/uL (ref 0.7–4.0)
MCHC: 34.1 g/dL (ref 30.0–36.0)
MCV: 93.3 fl (ref 78.0–100.0)
Monocytes Absolute: 0.4 10*3/uL (ref 0.1–1.0)
Monocytes Relative: 6.4 % (ref 3.0–12.0)
NEUTROS ABS: 3.2 10*3/uL (ref 1.4–7.7)
Neutrophils Relative %: 51.9 % (ref 43.0–77.0)
PLATELETS: 322 10*3/uL (ref 150.0–400.0)
RBC: 3.88 Mil/uL (ref 3.87–5.11)
RDW: 12.7 % (ref 11.5–15.5)
WBC: 6.1 10*3/uL (ref 4.0–10.5)

## 2014-05-18 LAB — LIPID PANEL
Cholesterol: 206 mg/dL — ABNORMAL HIGH (ref 0–200)
HDL: 61.5 mg/dL (ref >39.00)
LDL Cholesterol: 112 mg/dL — ABNORMAL HIGH (ref 0–99)
NonHDL: 144.5
Total CHOL/HDL Ratio: 3
Triglycerides: 163 mg/dL — ABNORMAL HIGH (ref 0.0–149.0)
VLDL: 32.6 mg/dL (ref 0.0–40.0)

## 2014-05-18 LAB — HEPATIC FUNCTION PANEL
ALK PHOS: 45 U/L (ref 39–117)
ALT: 18 U/L (ref 0–35)
AST: 25 U/L (ref 0–37)
Albumin: 4.5 g/dL (ref 3.5–5.2)
BILIRUBIN DIRECT: 0.1 mg/dL (ref 0.0–0.3)
TOTAL PROTEIN: 7.3 g/dL (ref 6.0–8.3)
Total Bilirubin: 0.5 mg/dL (ref 0.2–1.2)

## 2014-05-18 LAB — TSH: TSH: 1.72 u[IU]/mL (ref 0.35–4.50)

## 2014-05-18 LAB — BASIC METABOLIC PANEL
BUN: 15 mg/dL (ref 6–23)
CO2: 26 mEq/L (ref 19–32)
Calcium: 10.1 mg/dL (ref 8.4–10.5)
Chloride: 101 mEq/L (ref 96–112)
Creatinine, Ser: 0.79 mg/dL (ref 0.40–1.20)
GFR: 77.65 mL/min (ref >60.00)
Glucose, Bld: 100 mg/dL — ABNORMAL HIGH (ref 70–99)
Potassium: 3.7 mEq/L (ref 3.5–5.1)
SODIUM: 135 meq/L (ref 135–145)

## 2014-05-18 NOTE — Patient Instructions (Addendum)
Your next office appointment will be determined based upon review of your pending labs . Those instructions will be transmitted to you through My Chart  Critical values will be called.  Plain Mucinex (NOT D) for thick secretions ;force NON dairy fluids .   Nasal cleansing in the shower as discussed with lather of mild shampoo.After 10 seconds wash off lather while  exhaling through nostrils. Make sure that all residual soap is removed to prevent irritation.  Flonase OR Nasacort AQ 1 spray in each nostril twice a day as needed. Use the "crossover" technique into opposite nostril spraying toward opposite ear @ 45 degree angle, not straight up into nostril.  Plain Allegra (NOT D )  160 daily , Loratidine 10 mg , OR Zyrtec 10 mg @ bedtime  as needed for itchy eyes & sneezing.

## 2014-05-18 NOTE — Progress Notes (Signed)
Pre visit review using our clinic review tool, if applicable. No additional management support is needed unless otherwise documented below in the visit note. 

## 2014-05-18 NOTE — Progress Notes (Signed)
   Subjective:    Patient ID: Jessica [name], female    DOB: November 17, 1950, 64 y.o.   MRN: [id number]  HPI  She is here for a physical;acute issues denied.  She has been on a heart healthy diet; she does not add salt at the table  She's exercising 3-4 times per week for 45-60 minutes without associated cardiopulmonary symptoms  Her blood pressure averages less than 130/85 at work.  She has a history of reflux; she denies any active GI symptoms at this time.   Review of Systems  Chest pain, palpitations, tachycardia, exertional dyspnea, paroxysmal nocturnal dyspnea, claudication or edema are absent.  Unexplained weight loss, abdominal pain, significant dyspepsia, dysphagia, melena, rectal bleeding, or persistently small caliber stools are denied.    Objective:   Physical Exam Gen.: Thin but adequately nourished in appearance. Alert, appropriate and cooperative throughout exam.  Appears younger than stated age  Head: Normocephalic without obvious abnormalities  Eyes: No corneal or conjunctival inflammation noted. Pupils equal round reactive to light and accommodation. Extraocular motion intact.  Ears: External  ear exam reveals no significant lesions or deformities. Canals clear .TMs normal. Hearing is grossly normal bilaterally. Nose: External nasal exam reveals no deformity or inflammation. Nasal mucosa are pink and moist. No lesions or exudates noted.   Mouth: Oral mucosa and oropharynx reveal no lesions or exudates. Teeth in good repair. Neck: No deformities, masses, or tenderness noted. Range of motion normal. Thyroid small. Lungs: Normal respiratory effort; chest expands symmetrically. Lungs are clear to auscultation without rales, wheezes, or increased work of breathing. Heart: Normal rate and rhythm. Normal S1 and S2. No gallop, click, or rub. No murmur. Abdomen: Bowel sounds normal; abdomen soft and nontender. No masses, organomegaly or hernias noted.Aorta palpable ; no  AAA Genitalia: as per Gyn                                  Musculoskeletal/extremities: No deformity or scoliosis noted of  the thoracic or lumbar spine.  No clubbing, cyanosis, edema, or significant extremity  deformity noted.  Range of motion normal . Tone & strength normal. Hand joints reveal 5th digit flexion contractures Fingernail  health good. Slight crepitus of knees  Able to lie down & sit up w/o help.  Negative SLR bilaterally Vascular: Carotid, radial artery, dorsalis pedis and  posterior tibial pulses are full and equal. No bruits present. Neurologic: Alert and oriented x3. Deep tendon reflexes symmetrical and 1.5-2+ @ knees Gait normal    Skin: Intact without suspicious lesions or rashes. Lymph: No cervical, axillary lymphadenopathy present. Psych: Mood and affect are normal. Normally interactive                                                                                     Assessment & Plan:  #1 comprehensive physical exam; no acute findings  Plan: see Orders  & Recommendations

## 2014-05-19 ENCOUNTER — Encounter: Payer: Self-pay | Admitting: Internal Medicine

## 2014-05-19 ENCOUNTER — Other Ambulatory Visit (INDEPENDENT_AMBULATORY_CARE_PROVIDER_SITE_OTHER): Payer: 59

## 2014-05-19 ENCOUNTER — Telehealth: Payer: Self-pay

## 2014-05-19 DIAGNOSIS — E785 Hyperlipidemia, unspecified: Secondary | ICD-10-CM | POA: Insufficient documentation

## 2014-05-19 DIAGNOSIS — R739 Hyperglycemia, unspecified: Secondary | ICD-10-CM

## 2014-05-19 LAB — HEMOGLOBIN A1C: HEMOGLOBIN A1C: 5.8 % (ref 4.6–6.5)

## 2014-05-19 NOTE — Telephone Encounter (Signed)
-----   Message from Hendricks Limes, MD sent at 05/19/2014  7:04 AM EST ----- Please add A1c (R73.9)

## 2014-05-19 NOTE — Telephone Encounter (Signed)
Request for add on has been faxed to lab 

## 2014-06-15 ENCOUNTER — Other Ambulatory Visit: Payer: Self-pay | Admitting: Internal Medicine

## 2014-06-18 ENCOUNTER — Encounter: Payer: Self-pay | Admitting: Internal Medicine

## 2014-06-18 ENCOUNTER — Other Ambulatory Visit: Payer: Self-pay | Admitting: Internal Medicine

## 2014-06-18 DIAGNOSIS — F321 Major depressive disorder, single episode, moderate: Secondary | ICD-10-CM | POA: Diagnosis not present

## 2014-06-18 DIAGNOSIS — E785 Hyperlipidemia, unspecified: Secondary | ICD-10-CM

## 2014-07-13 DIAGNOSIS — F321 Major depressive disorder, single episode, moderate: Secondary | ICD-10-CM | POA: Diagnosis not present

## 2014-07-23 DIAGNOSIS — F321 Major depressive disorder, single episode, moderate: Secondary | ICD-10-CM | POA: Diagnosis not present

## 2014-09-10 DIAGNOSIS — F321 Major depressive disorder, single episode, moderate: Secondary | ICD-10-CM | POA: Diagnosis not present

## 2014-09-17 DIAGNOSIS — F321 Major depressive disorder, single episode, moderate: Secondary | ICD-10-CM | POA: Diagnosis not present

## 2014-10-01 ENCOUNTER — Other Ambulatory Visit: Payer: Self-pay

## 2014-10-01 DIAGNOSIS — Z1231 Encounter for screening mammogram for malignant neoplasm of breast: Secondary | ICD-10-CM

## 2014-10-01 DIAGNOSIS — F321 Major depressive disorder, single episode, moderate: Secondary | ICD-10-CM | POA: Diagnosis not present

## 2014-10-02 ENCOUNTER — Telehealth: Payer: Self-pay

## 2014-10-02 NOTE — Telephone Encounter (Signed)
Left message advising patient that she is overdue for mammogram, call back to update info if patient has recd one or patient may call to schedule mammogram

## 2014-10-19 ENCOUNTER — Other Ambulatory Visit: Payer: Self-pay

## 2014-10-27 ENCOUNTER — Ambulatory Visit
Admission: RE | Admit: 2014-10-27 | Discharge: 2014-10-27 | Disposition: A | Discharge status: Home / Self Care | Payer: 59 | Source: Ambulatory Visit

## 2014-10-27 DIAGNOSIS — Z1231 Encounter for screening mammogram for malignant neoplasm of breast: Secondary | ICD-10-CM

## 2014-12-22 ENCOUNTER — Telehealth: Payer: Self-pay | Admitting: Internal Medicine

## 2014-12-22 NOTE — Telephone Encounter (Signed)
Please advise 

## 2014-12-22 NOTE — Telephone Encounter (Signed)
OK with Refill X 5

## 2014-12-22 NOTE — Telephone Encounter (Signed)
[facility] pharmacy called stating patient wants a refill on NASONEX 50 MCG/ACT nasal spray [[id number]. The brand name is expensive and pt wondering if you can send a prescription for the generic brand

## 2014-12-23 ENCOUNTER — Other Ambulatory Visit: Payer: Self-pay | Admitting: Emergency Medicine

## 2014-12-23 MED ORDER — NASONEX 50 MCG/ACT NA SUSP: NASAL | Status: AC

## 2014-12-31 DIAGNOSIS — F321 Major depressive disorder, single episode, moderate: Secondary | ICD-10-CM | POA: Diagnosis not present

## 2015-02-17 DIAGNOSIS — Z681 Body mass index (BMI) 19 or less, adult: Secondary | ICD-10-CM | POA: Diagnosis not present

## 2015-02-17 DIAGNOSIS — Z1212 Encounter for screening for malignant neoplasm of rectum: Secondary | ICD-10-CM | POA: Diagnosis not present

## 2015-02-17 DIAGNOSIS — Z01419 Encounter for gynecological examination (general) (routine) without abnormal findings: Secondary | ICD-10-CM | POA: Diagnosis not present

## 2015-03-31 ENCOUNTER — Encounter: Payer: Self-pay | Admitting: Internal Medicine

## 2015-04-08 ENCOUNTER — Other Ambulatory Visit: Payer: Self-pay | Admitting: Internal Medicine

## 2015-04-29 MED FILL — BUPROPION HCL XL 300 MG TAB: 300 | 30 days supply | Qty: 30 | Fill #2

## 2015-05-03 MED FILL — METHYLPHENIDATE ER 54 MG TA: 54 | 30 days supply | Qty: 30 | Fill #0

## 2015-05-28 DIAGNOSIS — F418 Other specified anxiety disorders: Secondary | ICD-10-CM | POA: Diagnosis not present

## 2015-05-28 DIAGNOSIS — Z23 Encounter for immunization: Secondary | ICD-10-CM | POA: Diagnosis not present

## 2015-05-28 DIAGNOSIS — M859 Disorder of bone density and structure, unspecified: Secondary | ICD-10-CM | POA: Diagnosis not present

## 2015-05-28 DIAGNOSIS — K219 Gastro-esophageal reflux disease without esophagitis: Secondary | ICD-10-CM | POA: Diagnosis not present

## 2015-05-28 DIAGNOSIS — R5383 Other fatigue: Secondary | ICD-10-CM | POA: Diagnosis not present

## 2015-05-28 DIAGNOSIS — R8299 Other abnormal findings in urine: Secondary | ICD-10-CM | POA: Diagnosis not present

## 2015-05-28 DIAGNOSIS — Z0000 Encounter for general adult medical examination without abnormal findings: Secondary | ICD-10-CM | POA: Diagnosis not present

## 2015-05-28 DIAGNOSIS — I10 Essential (primary) hypertension: Secondary | ICD-10-CM | POA: Diagnosis not present

## 2015-05-28 DIAGNOSIS — G4709 Other insomnia: Secondary | ICD-10-CM | POA: Diagnosis not present

## 2015-05-28 DIAGNOSIS — Z1389 Encounter for screening for other disorder: Secondary | ICD-10-CM | POA: Diagnosis not present

## 2015-05-28 DIAGNOSIS — Z681 Body mass index (BMI) 19 or less, adult: Secondary | ICD-10-CM | POA: Diagnosis not present

## 2015-05-28 DIAGNOSIS — Z780 Asymptomatic menopausal state: Secondary | ICD-10-CM | POA: Diagnosis not present

## 2015-05-28 MED FILL — ESCITALOPRAM 10 MG TABLET: 10 | 30 days supply | Qty: 30 | Fill #0

## 2015-06-03 MED FILL — METHYLPHENIDATE ER 54 MG TA: 54 | 30 days supply | Qty: 30 | Fill #0

## 2015-06-08 MED FILL — ALPRAZolam 1 MG TABS: 1 | 90 days supply | Qty: 90 | Fill #0

## 2015-06-08 MED FILL — BUPROPION HCL XL 300 MG TAB: 300 | 30 days supply | Qty: 30 | Fill #3

## 2015-06-25 DIAGNOSIS — F321 Major depressive disorder, single episode, moderate: Secondary | ICD-10-CM | POA: Diagnosis not present

## 2015-06-28 MED FILL — ESCITALOPRAM 10 MG TABLET: 10 | 30 days supply | Qty: 30 | Fill #1

## 2015-07-01 MED FILL — METHYLPHENIDATE ER 54 MG TA: 54 | 30 days supply | Qty: 30 | Fill #0

## 2015-07-12 MED FILL — BUPROPION HCL XL 300 MG TAB: 300 | 30 days supply | Qty: 30 | Fill #4

## 2015-07-15 MED FILL — NexIUM 40 MG CPDR: 40 | 30 days supply | Qty: 30 | Fill #0

## 2015-07-16 MED FILL — LISINOPRIL 20 MG TABLET: 20 | 90 days supply | Qty: 90 | Fill #0

## 2015-07-22 MED FILL — FLUTICASONE PROP 50 MCG SPR: 50 | 30 days supply | Qty: 16 | Fill #0

## 2015-07-28 MED FILL — ESCITALOPRAM 10 MG TABLET: 10 | 30 days supply | Qty: 30 | Fill #2

## 2015-08-16 ENCOUNTER — Other Ambulatory Visit: Payer: Self-pay | Admitting: Internal Medicine

## 2015-08-16 MED FILL — BUPROPION HCL XL 300 MG TAB: 300 | 30 days supply | Qty: 30 | Fill #5

## 2015-08-16 NOTE — Telephone Encounter (Signed)
Routing to patient's new pcp 

## 2015-08-23 MED FILL — METHYLPHENIDATE ER 54 MG TA: 54 | 30 days supply | Qty: 30 | Fill #0

## 2015-08-23 MED FILL — NexIUM 40 MG CPDR: 40 | 30 days supply | Qty: 30 | Fill #1

## 2015-09-06 MED FILL — ALPRAZolam 1 MG TABS: 1 | 90 days supply | Qty: 90 | Fill #0

## 2015-09-12 IMAGING — MG MM SCREENING BREAST W/IMPLANT TOMO BILATERAL
9 of 16 series · 9 of 40 positions shown · non-contrast
Comparison: Previous exam(s)

CLINICAL DATA: Screening.

EXAM:
DIGITAL SCREENING BILATERAL MAMMOGRAM WITH IMPLANTS, 3D TOMO WITH
CAD
The patient has retro glandular implants. Standard and implant
displaced views were performed.

[R CC (1 of 2)]
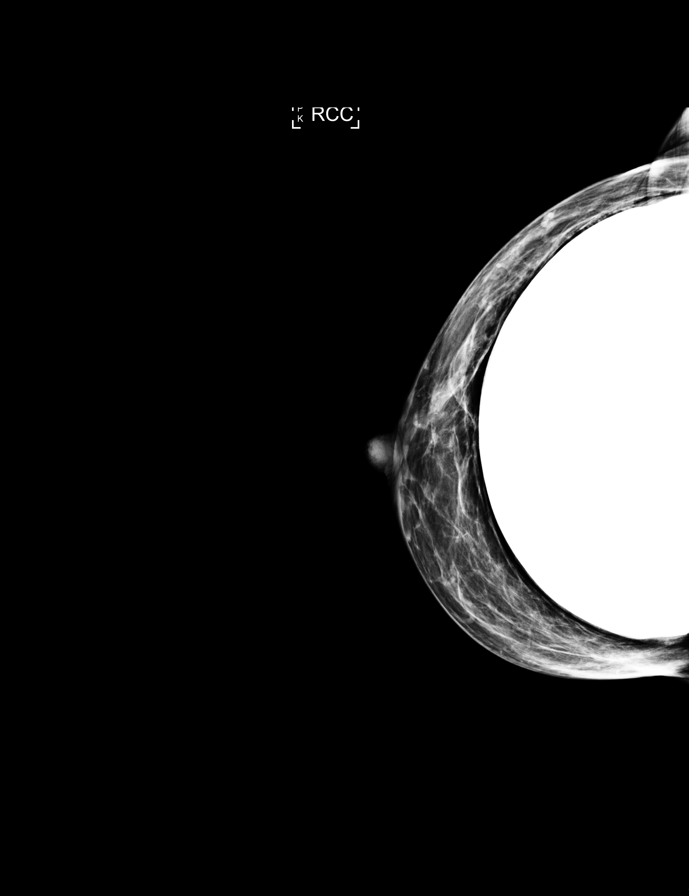

[L MLO (1 of 3)]
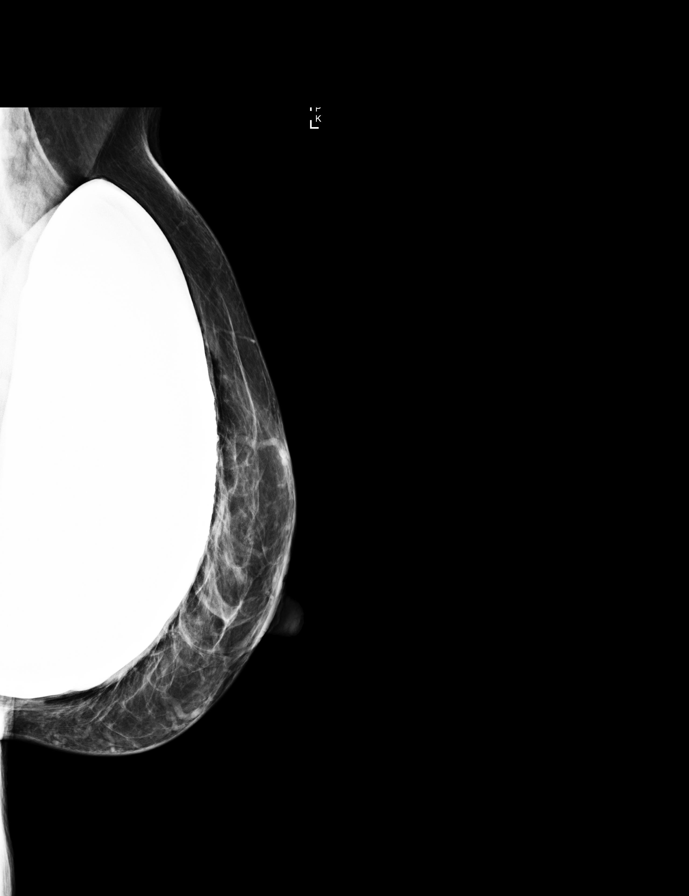

[L CC]
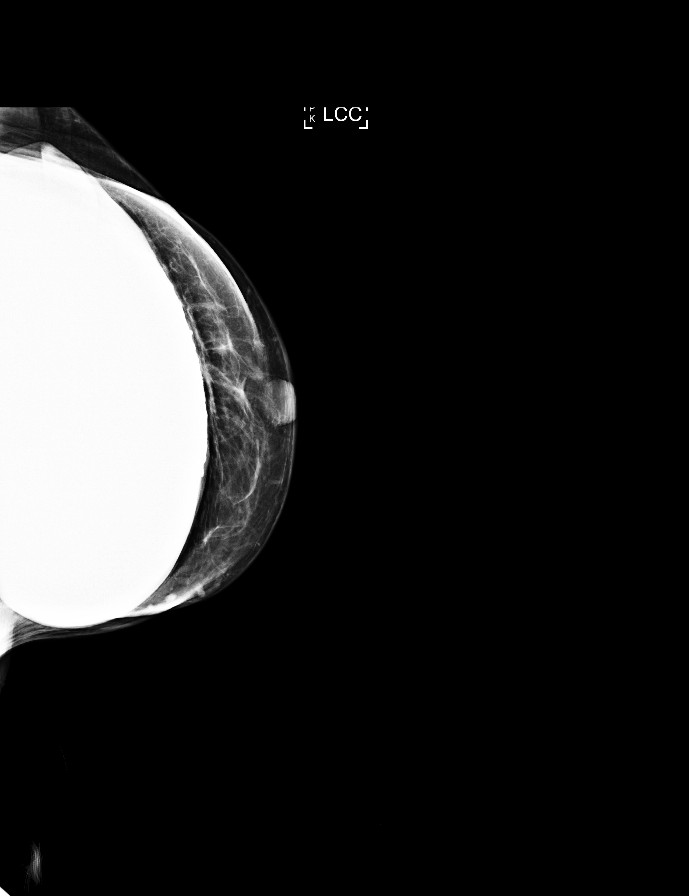

[R MLO (1 of 3)]
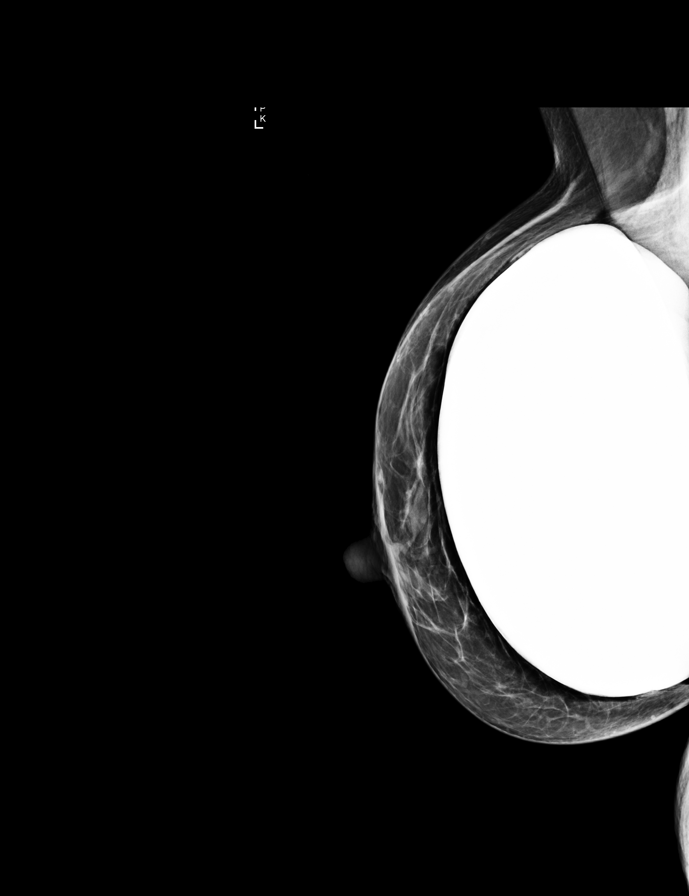

[R MLO (2 of 3)]
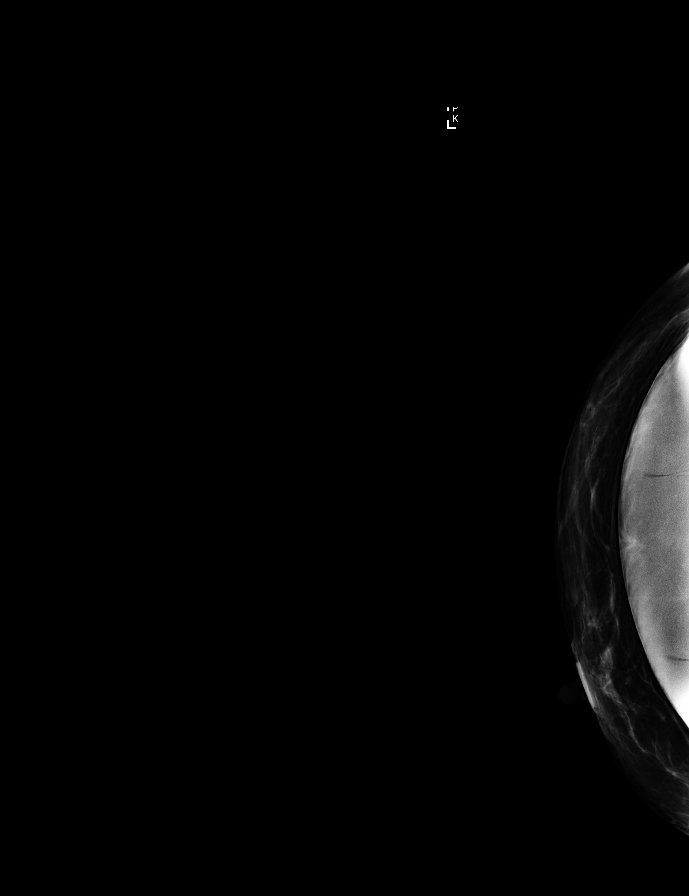

[L MLO (2 of 3)]
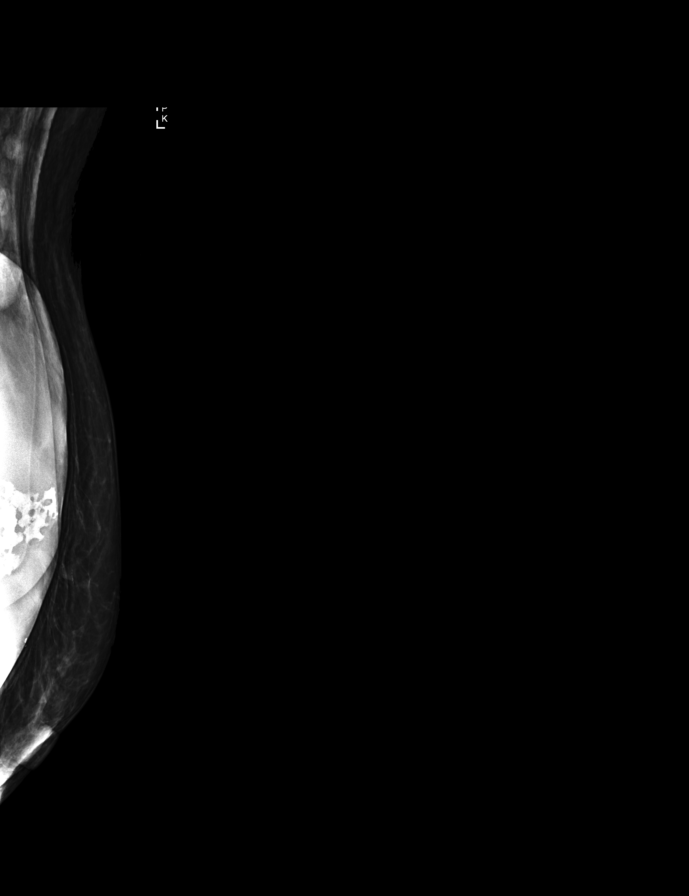

[L MLO (3 of 3)]
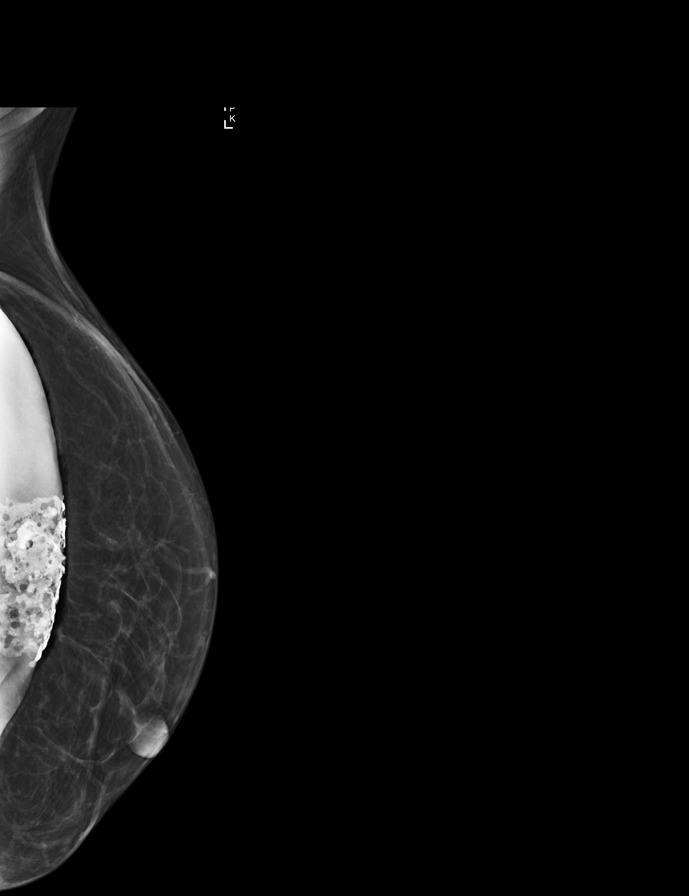

[R MLO (3 of 3)]
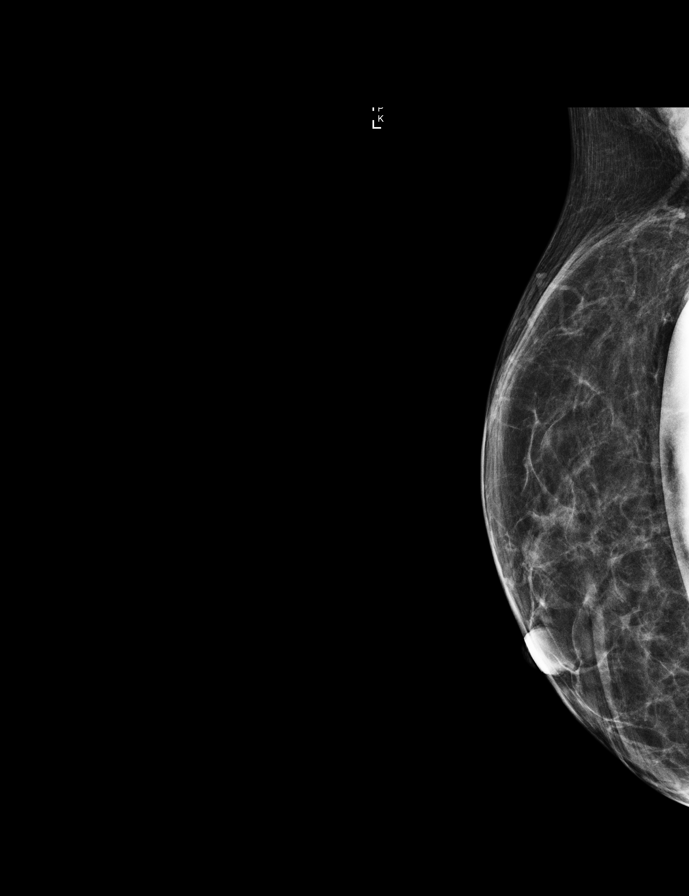

[R CC (2 of 2)]
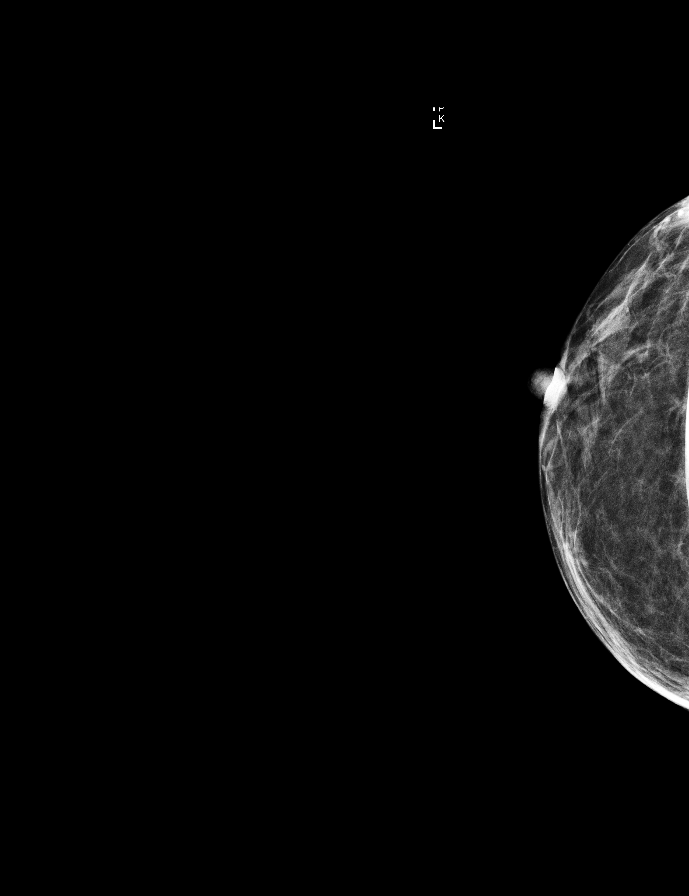

[9 of 40 positions shown; findings below may reference images not displayed]

ACR Breast Density Category b: There are scattered areas of
fibroglandular density.
FINDINGS: There are no findings suspicious for malignancy. Images were
processed with CAD.
IMPRESSION: No mammographic evidence of malignancy. A result letter of this
screening mammogram will be mailed directly to the patient.

RECOMMENDATION:
Screening mammogram in one year. (Code:SD-5-VAZ)

BI-RADS CATEGORY  1:  Negative.

## 2015-09-22 MED FILL — METHYLPHENIDATE ER 54 MG TA: 54 | 30 days supply | Qty: 30 | Fill #0

## 2015-10-25 MED FILL — METHYLPHENIDATE ER 54 MG TA: 54 | 30 days supply | Qty: 30 | Fill #0

## 2015-11-29 MED FILL — FLUTICASONE PROP 50 MCG SPR: 50 | 30 days supply | Qty: 16 | Fill #1

## 2015-12-01 MED FILL — METHYLPHENIDATE ER 54 MG TA: 54 | 30 days supply | Qty: 30 | Fill #0

## 2015-12-10 DIAGNOSIS — F321 Major depressive disorder, single episode, moderate: Secondary | ICD-10-CM | POA: Diagnosis not present

## 2016-01-25 DIAGNOSIS — D225 Melanocytic nevi of trunk: Secondary | ICD-10-CM | POA: Diagnosis not present

## 2016-01-25 DIAGNOSIS — L821 Other seborrheic keratosis: Secondary | ICD-10-CM | POA: Diagnosis not present

## 2016-01-25 DIAGNOSIS — L720 Epidermal cyst: Secondary | ICD-10-CM | POA: Diagnosis not present

## 2016-01-25 DIAGNOSIS — D2262 Melanocytic nevi of left upper limb, including shoulder: Secondary | ICD-10-CM | POA: Diagnosis not present

## 2016-01-25 DIAGNOSIS — D2272 Melanocytic nevi of left lower limb, including hip: Secondary | ICD-10-CM | POA: Diagnosis not present

## 2016-01-25 DIAGNOSIS — D692 Other nonthrombocytopenic purpura: Secondary | ICD-10-CM | POA: Diagnosis not present

## 2016-02-11 DIAGNOSIS — Z1231 Encounter for screening mammogram for malignant neoplasm of breast: Secondary | ICD-10-CM | POA: Diagnosis not present

## 2016-03-28 MED FILL — METHYLPHENIDATE ER 54 MG TA: 54 | 30 days supply | Qty: 30 | Fill #0

## 2016-04-11 DIAGNOSIS — Z681 Body mass index (BMI) 19 or less, adult: Secondary | ICD-10-CM | POA: Diagnosis not present

## 2016-04-11 DIAGNOSIS — Z124 Encounter for screening for malignant neoplasm of cervix: Secondary | ICD-10-CM | POA: Diagnosis not present

## 2016-04-11 DIAGNOSIS — Z01419 Encounter for gynecological examination (general) (routine) without abnormal findings: Secondary | ICD-10-CM | POA: Diagnosis not present

## 2016-05-08 MED FILL — METHYLPHENIDATE ER 54 MG TA: 54 | 30 days supply | Qty: 30 | Fill #0

## 2016-05-26 DIAGNOSIS — I10 Essential (primary) hypertension: Secondary | ICD-10-CM | POA: Diagnosis not present

## 2016-05-26 DIAGNOSIS — R8299 Other abnormal findings in urine: Secondary | ICD-10-CM | POA: Diagnosis not present

## 2016-05-26 DIAGNOSIS — F321 Major depressive disorder, single episode, moderate: Secondary | ICD-10-CM | POA: Diagnosis not present

## 2016-05-26 DIAGNOSIS — M859 Disorder of bone density and structure, unspecified: Secondary | ICD-10-CM | POA: Diagnosis not present

## 2016-06-02 DIAGNOSIS — Z23 Encounter for immunization: Secondary | ICD-10-CM | POA: Diagnosis not present

## 2016-06-02 DIAGNOSIS — M859 Disorder of bone density and structure, unspecified: Secondary | ICD-10-CM | POA: Diagnosis not present

## 2016-06-02 DIAGNOSIS — Z1389 Encounter for screening for other disorder: Secondary | ICD-10-CM | POA: Diagnosis not present

## 2016-06-02 DIAGNOSIS — Z681 Body mass index (BMI) 19 or less, adult: Secondary | ICD-10-CM | POA: Diagnosis not present

## 2016-06-02 DIAGNOSIS — G4700 Insomnia, unspecified: Secondary | ICD-10-CM | POA: Diagnosis not present

## 2016-06-02 DIAGNOSIS — K219 Gastro-esophageal reflux disease without esophagitis: Secondary | ICD-10-CM | POA: Diagnosis not present

## 2016-06-02 DIAGNOSIS — E785 Hyperlipidemia, unspecified: Secondary | ICD-10-CM | POA: Diagnosis not present

## 2016-06-02 DIAGNOSIS — I10 Essential (primary) hypertension: Secondary | ICD-10-CM | POA: Diagnosis not present

## 2016-06-02 DIAGNOSIS — Z0000 Encounter for general adult medical examination without abnormal findings: Secondary | ICD-10-CM | POA: Diagnosis not present

## 2016-06-02 DIAGNOSIS — F418 Other specified anxiety disorders: Secondary | ICD-10-CM | POA: Diagnosis not present

## 2016-07-17 DIAGNOSIS — H02135 Senile ectropion of left lower eyelid: Secondary | ICD-10-CM | POA: Diagnosis not present

## 2016-07-17 DIAGNOSIS — H02132 Senile ectropion of right lower eyelid: Secondary | ICD-10-CM | POA: Diagnosis not present

## 2016-07-17 DIAGNOSIS — H02423 Myogenic ptosis of bilateral eyelids: Secondary | ICD-10-CM | POA: Diagnosis not present

## 2016-07-17 DIAGNOSIS — H02834 Dermatochalasis of left upper eyelid: Secondary | ICD-10-CM | POA: Diagnosis not present

## 2016-07-17 DIAGNOSIS — H0279 Other degenerative disorders of eyelid and periocular area: Secondary | ICD-10-CM | POA: Diagnosis not present

## 2016-07-17 DIAGNOSIS — H02831 Dermatochalasis of right upper eyelid: Secondary | ICD-10-CM | POA: Diagnosis not present

## 2016-07-17 DIAGNOSIS — H02413 Mechanical ptosis of bilateral eyelids: Secondary | ICD-10-CM | POA: Diagnosis not present

## 2016-09-28 DIAGNOSIS — H2513 Age-related nuclear cataract, bilateral: Secondary | ICD-10-CM | POA: Diagnosis not present

## 2016-11-10 DIAGNOSIS — F321 Major depressive disorder, single episode, moderate: Secondary | ICD-10-CM | POA: Diagnosis not present

## 2016-11-14 ENCOUNTER — Emergency Department (HOSPITAL_COMMUNITY)
Admission: EM | Admit: 2016-11-14 | Discharge: 2016-11-14 | Disposition: A | Discharge status: Home / Self Care | Payer: Medicare Other | Attending: Emergency Medicine | Admitting: Emergency Medicine

## 2016-11-14 ENCOUNTER — Emergency Department (HOSPITAL_BASED_OUTPATIENT_CLINIC_OR_DEPARTMENT_OTHER)
Admission: EM | Admit: 2016-11-14 | Discharge: 2016-11-14 | Disposition: A | Discharge status: Home / Self Care | Payer: Medicare Other | Attending: Emergency Medicine | Admitting: Emergency Medicine

## 2016-11-14 ENCOUNTER — Encounter (HOSPITAL_BASED_OUTPATIENT_CLINIC_OR_DEPARTMENT_OTHER): Payer: Self-pay

## 2016-11-14 ENCOUNTER — Emergency Department (HOSPITAL_BASED_OUTPATIENT_CLINIC_OR_DEPARTMENT_OTHER): Payer: Medicare Other

## 2016-11-14 DIAGNOSIS — Y9301 Activity, walking, marching and hiking: Secondary | ICD-10-CM | POA: Diagnosis not present

## 2016-11-14 DIAGNOSIS — Z79899 Other long term (current) drug therapy: Secondary | ICD-10-CM | POA: Diagnosis not present

## 2016-11-14 DIAGNOSIS — Y92488 Other paved roadways as the place of occurrence of the external cause: Secondary | ICD-10-CM | POA: Diagnosis not present

## 2016-11-14 DIAGNOSIS — S0990XA Unspecified injury of head, initial encounter: Secondary | ICD-10-CM

## 2016-11-14 DIAGNOSIS — S0181XA Laceration without foreign body of other part of head, initial encounter: Secondary | ICD-10-CM | POA: Insufficient documentation

## 2016-11-14 DIAGNOSIS — W01198A Fall on same level from slipping, tripping and stumbling with subsequent striking against other object, initial encounter: Secondary | ICD-10-CM | POA: Insufficient documentation

## 2016-11-14 DIAGNOSIS — I10 Essential (primary) hypertension: Secondary | ICD-10-CM | POA: Diagnosis not present

## 2016-11-14 DIAGNOSIS — Z5321 Procedure and treatment not carried out due to patient leaving prior to being seen by health care provider: Secondary | ICD-10-CM | POA: Insufficient documentation

## 2016-11-14 DIAGNOSIS — Z23 Encounter for immunization: Secondary | ICD-10-CM | POA: Diagnosis not present

## 2016-11-14 DIAGNOSIS — R51 Headache: Secondary | ICD-10-CM | POA: Diagnosis not present

## 2016-11-14 DIAGNOSIS — Y999 Unspecified external cause status: Secondary | ICD-10-CM | POA: Diagnosis not present

## 2016-11-14 DIAGNOSIS — Z87891 Personal history of nicotine dependence: Secondary | ICD-10-CM | POA: Insufficient documentation

## 2016-11-14 DIAGNOSIS — J45909 Unspecified asthma, uncomplicated: Secondary | ICD-10-CM | POA: Diagnosis not present

## 2016-11-14 DIAGNOSIS — S098XXA Other specified injuries of head, initial encounter: Secondary | ICD-10-CM | POA: Diagnosis present

## 2016-11-14 MED ORDER — TETANUS-DIPHTH-ACELL PERTUSSIS 5-2.5-18.5 LF-MCG/0.5 IM SUSP
0.5000 mL | Freq: Once | INTRAMUSCULAR | Status: AC
Start: 2016-11-14 — End: 2016-11-14
  Administered 2016-11-14: 0.5 mL via INTRAMUSCULAR
  Filled 2016-11-14: qty 0.5

## 2016-11-14 MED ORDER — LIDOCAINE-EPINEPHRINE 2 %-1:100000 IJ SOLN
20.0000 mL | Freq: Once | INTRAMUSCULAR | Status: DC
Start: 2016-11-14 — End: 2016-11-14
  Filled 2016-11-14: qty 1

## 2016-11-14 MED ORDER — ACETAMINOPHEN 325 MG PO TABS
650.0000 mg | ORAL_TABLET | Freq: Once | ORAL | Status: AC
  Administered 2016-11-14: 650 mg via ORAL
  Filled 2016-11-14: qty 2

## 2016-11-14 NOTE — ED Notes (Signed)
ED Provider at bedside. 

## 2016-11-14 NOTE — ED Provider Notes (Signed)
[facility] DEPT [facility] Provider Note   CSN: [id number] Arrival date & time: 11/14/16  1346     History   Chief Complaint Chief Complaint  Patient presents with  . Head Injury    HPI Jessica [name] is a 66 y.o. female with past medical history significant for HTN, asthma who presents today with a left forehead laceration. Patient reports that they were construction on the road in front of her gym, patient slipped on the newly poured tar and felt on her face. Patient denies any loss of consciousness. Patient has a laceration on her left forehead, which has been bleeding. Patient endorses mild HA but denies any vision changes, dizziness, CP, SOB.   HPI  Past Medical History:  Diagnosis Date  . Allergic rhinitis    seasonal  . Asthma    EIB & seasonal allergen trigger  . Depression   . Hypertension   . Pain in shoulder    right side  . SOB (shortness of breath) on exertion     Patient Active Problem List   Diagnosis Date Noted  . Hyperlipidemia 05/19/2014  . HTN (hypertension) 07/04/2012  . Rotator cuff tendonitis 05/09/2012  . GERD (gastroesophageal reflux disease) 12/15/2011  . NEVUS 12/02/2007  . Seasonal allergies 12/02/2007    Past Surgical History:  Procedure Laterality Date  . COLONOSCOPY  2003   Dr Olevia Perches, due  01/2012  . SHOULDER ARTHROSCOPY WITH OPEN ROTATOR CUFF REPAIR AND DISTAL CLAVICLE ACROMINECTOMY Right 10/09/2013   Procedure: RIGHT SHOULDER ARTHROSCOPY, SUBACROMIAL DECOMPRESSION, DISTAL CLAVICLE RESECTION, ROTATOR CUFF REPAIR  AND POSSIBLE OPEN ROTATOR CUFF REPAIR ;  Surgeon: Marin Shutter, MD;  Location: [facility];  Service: Orthopedics;  Laterality: Right;  . TUBAL LIGATION      OB History    No data available       Home Medications    Prior to Admission medications   Medication Sig Start Date End Date Taking? Authorizing Provider  VALSARTAN PO Take by mouth.   Yes [provider]  Ascorbic Acid (VITAMIN C PO) Take 1 tablet by mouth  daily.    [provider]  buPROPion (WELLBUTRIN XL) 150 MG 24 hr tablet Take 150 mg by mouth daily. Rx'ed by [facility]    [provider]  esomeprazole (NEXIUM) 40 MG capsule TAKE 1 CAPSULE BY MOUTH DAILY 04/08/15   Hendricks Limes, MD  FLUoxetine (PROZAC) 20 MG capsule Take 20 mg by mouth daily.    [provider]  NASONEX 50 MCG/ACT nasal spray PLACE 2 SPRAYS INTO THE NOSE DAILY AS NEEDED. 12/23/14   Hendricks Limes, MD  VENTOLIN HFA 108 (90 BASE) MCG/ACT inhaler INHALE 2 PUFFS BY MOUTH EVERY 6 HOURS AS NEEDED 12/08/13   Hendricks Limes, MD    Family History Family History  Problem Relation Age of Onset  . Stroke Mother 37  . Dementia Mother   . Stroke Father 12  . Heart disease Brother 68       LAD stenting  . Colon cancer Maternal Uncle   . Diabetes Son        Type 1  . Prostate cancer Maternal Uncle   . Stroke Paternal Grandmother        mid 19s  . Hypertension Paternal Grandmother     Social History Social History  Substance Use Topics  . Smoking status: Former Smoker    Types: Cigarettes    Quit date: 04/24/1976  . Smokeless tobacco: Never Used  Comment: smoked [age], up to 1ppd  . Alcohol use Yes     Comment: daily     Allergies   Meperidine hcl; Metoprolol; Oxycodone-acetaminophen; and Sulfa antibiotics   Review of Systems Review of Systems  Constitutional: Negative.   HENT: Negative.   Eyes: Negative.   Respiratory: Negative.   Cardiovascular: Negative.   Gastrointestinal: Negative.   Endocrine: Negative.   Genitourinary: Negative.   Musculoskeletal: Negative.   Skin: Positive for wound.  Allergic/Immunologic: Negative.   Neurological: Positive for headaches.  Hematological: Negative.   Psychiatric/Behavioral: Negative.      Physical Exam Updated Vital Signs BP 135/88 (BP Location: Right Arm)   Pulse 77   Temp 99 F (37.2 C) (Oral)   Resp 18   Wt 48.6 kg (107 lb 2 oz)   SpO2 100%   BMI 18.39 kg/m    Physical Exam  Constitutional: She is oriented to person, place, and time. She appears well-developed.  HENT:  Head: Normocephalic. Head is with laceration.    3 cm deep vertical laceration, bleeding, no foreign body. Small 0.5 cm left temporal laceration.  Eyes: Pupils are equal, round, and reactive to light. Conjunctivae and EOM are normal.  Neck: Normal range of motion. Neck supple.  Cardiovascular: Normal rate, regular rhythm and normal heart sounds.   Pulmonary/Chest: Effort normal and breath sounds normal.  Abdominal: Soft. Bowel sounds are normal.  Musculoskeletal: Normal range of motion.  Neurological: She is alert and oriented to person, place, and time.  Skin: Skin is warm and dry.  Psychiatric: She has a normal mood and affect. Her behavior is normal.     ED Treatments / Results  Labs (all labs ordered are listed, but only abnormal results are displayed) Labs Reviewed - No data to display  EKG  EKG Interpretation None       Radiology Ct Head Wo Contrast  Result Date: 11/14/2016 CLINICAL DATA:  Slipped on fresh wet tar and fell, struck head above LEFT brow, large forehead laceration EXAM: CT HEAD WITHOUT CONTRAST TECHNIQUE: Contiguous axial images were obtained from the base of the skull through the vertex without intravenous contrast. Sagittal and coronal MPR images reconstructed from axial data set. COMPARISON:  None FINDINGS: Brain: Normal ventricular morphology. No midline shift or mass effect. Normal appearance of brain parenchyma. No intracranial hemorrhage, mass lesion, evidence of acute infarction, or extra-axial fluid collection. Vascular: Unremarkable Skull: Small LEFT frontal scalp hematoma. Foci of soft tissue gas consistent with laceration history. Underlying calvarium intact. No fractures identified. Sinuses/Orbits: Clear Other: N/A IMPRESSION: No acute intracranial abnormalities. Electronically Signed   By: Lavonia Dana M.D.   On: 11/14/2016 14:58     Procedures Procedures (including critical care time) Please see separate note for laceration repair.  Medications Ordered in ED Medications  lidocaine-EPINEPHrine (XYLOCAINE W/EPI) 2 %-1:100000 (with pres) injection 20 mL (not administered)  Tdap (BOOSTRIX) injection 0.5 mL (0.5 mLs Intramuscular Given 11/14/16 1503)  acetaminophen (TYLENOL) tablet 650 mg (650 mg Oral Given 11/14/16 1519)     Initial Impression / Assessment and Plan / ED Course  I have reviewed the triage vital signs and the nursing notes.  Pertinent labs & imaging results that were available during my care of the patient were reviewed by me and considered in my medical decision making (see chart for details).   Patient is 66 yo female who presented after a mechanical fall with forehead laceration to left forehead. Patient did not lose consciousness. Head CT showed no acute  findings. Head laceration in left forehead was vertical and deep measured 3 cm with minimal swelling and a small temporal laceration o the left temporal aspect. Both lacerations required repair (please see note) patient tolerated procedure well. She was given tylenol for mild headache. Patient was discharged with close follow up with PCP in 5-7 days for suture removal. Patient express understanding and is in agreement with plan.  Final Clinical Impressions(s) / ED Diagnoses   Final diagnoses:  Injury of head, initial encounter  Laceration of other part of head without foreign body, initial encounter    New Prescriptions Discharge Medication List as of 11/14/2016  4:41 PM       Marjie Skiff, MD 11/14/16 [place], Wenda Overland, MD 11/15/16 [phone]

## 2016-11-14 NOTE — ED Triage Notes (Signed)
Pt tripped over road construction tar approx 1230pm-lac to left forehead-no LOC-steady gait

## 2016-11-14 NOTE — ED Notes (Signed)
Pt did not respond when called to triage 

## 2016-11-14 NOTE — Discharge Instructions (Signed)
Please make sure you follow up with PCP for suture removal in 5-7 days.

## 2016-11-21 DIAGNOSIS — Z4802 Encounter for removal of sutures: Secondary | ICD-10-CM | POA: Diagnosis not present

## 2016-11-21 DIAGNOSIS — S0093XD Contusion of unspecified part of head, subsequent encounter: Secondary | ICD-10-CM | POA: Diagnosis not present

## 2016-11-21 DIAGNOSIS — Z681 Body mass index (BMI) 19 or less, adult: Secondary | ICD-10-CM | POA: Diagnosis not present

## 2016-12-14 DIAGNOSIS — R51 Headache: Secondary | ICD-10-CM | POA: Diagnosis not present

## 2016-12-14 DIAGNOSIS — Z681 Body mass index (BMI) 19 or less, adult: Secondary | ICD-10-CM | POA: Diagnosis not present

## 2017-02-19 DIAGNOSIS — F321 Major depressive disorder, single episode, moderate: Secondary | ICD-10-CM | POA: Diagnosis not present

## 2017-05-10 DIAGNOSIS — R42 Dizziness and giddiness: Secondary | ICD-10-CM | POA: Diagnosis not present

## 2017-05-10 DIAGNOSIS — Z681 Body mass index (BMI) 19 or less, adult: Secondary | ICD-10-CM | POA: Diagnosis not present

## 2017-05-10 DIAGNOSIS — R51 Headache: Secondary | ICD-10-CM | POA: Diagnosis not present

## 2017-05-13 ENCOUNTER — Other Ambulatory Visit: Payer: Self-pay | Admitting: Internal Medicine

## 2017-05-13 DIAGNOSIS — G4489 Other headache syndrome: Secondary | ICD-10-CM

## 2017-05-15 DIAGNOSIS — H1132 Conjunctival hemorrhage, left eye: Secondary | ICD-10-CM | POA: Diagnosis not present

## 2017-05-19 ENCOUNTER — Ambulatory Visit
Admission: RE | Admit: 2017-05-19 | Discharge: 2017-05-19 | Disposition: A | Discharge status: Home / Self Care | Payer: Medicare Other | Source: Ambulatory Visit | Attending: Internal Medicine | Admitting: Internal Medicine

## 2017-05-19 DIAGNOSIS — R42 Dizziness and giddiness: Secondary | ICD-10-CM | POA: Diagnosis not present

## 2017-05-19 DIAGNOSIS — G4489 Other headache syndrome: Secondary | ICD-10-CM

## 2017-05-19 MED ORDER — GADOBENATE DIMEGLUMINE 529 MG/ML IV SOLN
10.0000 mL | Freq: Once | INTRAVENOUS | Status: AC | PRN
  Administered 2017-05-19: 10 mL via INTRAVENOUS

## 2017-05-22 DIAGNOSIS — L300 Nummular dermatitis: Secondary | ICD-10-CM | POA: Diagnosis not present

## 2017-05-22 DIAGNOSIS — L821 Other seborrheic keratosis: Secondary | ICD-10-CM | POA: Diagnosis not present

## 2017-05-22 DIAGNOSIS — D224 Melanocytic nevi of scalp and neck: Secondary | ICD-10-CM | POA: Diagnosis not present

## 2017-05-22 DIAGNOSIS — D2272 Melanocytic nevi of left lower limb, including hip: Secondary | ICD-10-CM | POA: Diagnosis not present

## 2017-05-22 DIAGNOSIS — D692 Other nonthrombocytopenic purpura: Secondary | ICD-10-CM | POA: Diagnosis not present

## 2017-05-22 DIAGNOSIS — D2262 Melanocytic nevi of left upper limb, including shoulder: Secondary | ICD-10-CM | POA: Diagnosis not present

## 2017-05-22 DIAGNOSIS — D225 Melanocytic nevi of trunk: Secondary | ICD-10-CM | POA: Diagnosis not present

## 2017-05-31 DIAGNOSIS — I10 Essential (primary) hypertension: Secondary | ICD-10-CM | POA: Diagnosis not present

## 2017-05-31 DIAGNOSIS — E7849 Other hyperlipidemia: Secondary | ICD-10-CM | POA: Diagnosis not present

## 2017-05-31 DIAGNOSIS — M859 Disorder of bone density and structure, unspecified: Secondary | ICD-10-CM | POA: Diagnosis not present

## 2017-05-31 DIAGNOSIS — R82998 Other abnormal findings in urine: Secondary | ICD-10-CM | POA: Diagnosis not present

## 2017-06-05 DIAGNOSIS — R51 Headache: Secondary | ICD-10-CM | POA: Diagnosis not present

## 2017-06-05 DIAGNOSIS — R42 Dizziness and giddiness: Secondary | ICD-10-CM | POA: Diagnosis not present

## 2017-06-05 DIAGNOSIS — K219 Gastro-esophageal reflux disease without esophagitis: Secondary | ICD-10-CM | POA: Diagnosis not present

## 2017-06-05 DIAGNOSIS — M8580 Other specified disorders of bone density and structure, unspecified site: Secondary | ICD-10-CM | POA: Diagnosis not present

## 2017-06-05 DIAGNOSIS — F418 Other specified anxiety disorders: Secondary | ICD-10-CM | POA: Diagnosis not present

## 2017-06-05 DIAGNOSIS — I10 Essential (primary) hypertension: Secondary | ICD-10-CM | POA: Diagnosis not present

## 2017-06-05 DIAGNOSIS — Z0000 Encounter for general adult medical examination without abnormal findings: Secondary | ICD-10-CM | POA: Diagnosis not present

## 2017-06-05 DIAGNOSIS — Z681 Body mass index (BMI) 19 or less, adult: Secondary | ICD-10-CM | POA: Diagnosis not present

## 2017-06-05 DIAGNOSIS — E7849 Other hyperlipidemia: Secondary | ICD-10-CM | POA: Diagnosis not present

## 2017-06-28 DIAGNOSIS — Z1231 Encounter for screening mammogram for malignant neoplasm of breast: Secondary | ICD-10-CM | POA: Diagnosis not present

## 2017-06-28 DIAGNOSIS — Z681 Body mass index (BMI) 19 or less, adult: Secondary | ICD-10-CM | POA: Diagnosis not present

## 2017-06-28 DIAGNOSIS — Z01419 Encounter for gynecological examination (general) (routine) without abnormal findings: Secondary | ICD-10-CM | POA: Diagnosis not present

## 2017-08-13 MED FILL — FLUoxetine HCL 20 MG CAPS: 20 | 90 days supply | Qty: 90 | Fill #0

## 2017-08-23 DIAGNOSIS — F411 Generalized anxiety disorder: Secondary | ICD-10-CM | POA: Diagnosis not present

## 2017-08-23 DIAGNOSIS — F341 Dysthymic disorder: Secondary | ICD-10-CM | POA: Diagnosis not present

## 2017-08-23 DIAGNOSIS — Z62820 Parent-biological child conflict: Secondary | ICD-10-CM | POA: Diagnosis not present

## 2017-10-02 DIAGNOSIS — F321 Major depressive disorder, single episode, moderate: Secondary | ICD-10-CM | POA: Diagnosis not present

## 2018-03-03 ENCOUNTER — Encounter: Payer: Self-pay | Admitting: Emergency Medicine

## 2018-03-03 DIAGNOSIS — F909 Attention-deficit hyperactivity disorder, unspecified type: Secondary | ICD-10-CM | POA: Insufficient documentation

## 2018-03-03 DIAGNOSIS — F5104 Psychophysiologic insomnia: Secondary | ICD-10-CM

## 2018-03-19 ENCOUNTER — Ambulatory Visit (INDEPENDENT_AMBULATORY_CARE_PROVIDER_SITE_OTHER): Payer: Medicare Other | Admitting: Psychiatry

## 2018-03-19 ENCOUNTER — Encounter: Payer: Self-pay | Admitting: Psychiatry

## 2018-03-19 DIAGNOSIS — F325 Major depressive disorder, single episode, in full remission: Secondary | ICD-10-CM

## 2018-03-19 DIAGNOSIS — F5105 Insomnia due to other mental disorder: Secondary | ICD-10-CM | POA: Diagnosis not present

## 2018-03-19 DIAGNOSIS — F900 Attention-deficit hyperactivity disorder, predominantly inattentive type: Secondary | ICD-10-CM | POA: Diagnosis not present

## 2018-03-19 MED ORDER — LITHIUM CARBONATE 150 MG PO CAPS: 150.0000 mg | ORAL_CAPSULE | Freq: Every day | ORAL | 3 refills | Status: DC

## 2018-03-19 MED ORDER — METHYLPHENIDATE HCL ER (OSM) 36 MG PO TBCR: 72.0000 mg | EXTENDED_RELEASE_TABLET | ORAL | 0 refills | Status: DC

## 2018-03-19 MED ORDER — ALPRAZOLAM 1 MG PO TABS: 1.0000 mg | ORAL_TABLET | Freq: Every evening | ORAL | 1 refills | Status: DC | PRN

## 2018-03-19 NOTE — Patient Instructions (Signed)
Trial NAC (N-acetylcysteine) 600 mg capsule 1 daily for memory and concentration

## 2018-03-19 NOTE — Progress Notes (Signed)
Jessica [name] [id number] Jun 29, 1950 67 y.o.  Subjective:   Patient ID:  Jessica [name] is a 67 y.o. (DOB Jul 14, 1950) female.  Chief Complaint:  Chief Complaint  Patient presents with  . ADHD  . Sleeping Problem  . Follow-up    depression    HPI Jessica [name] presents to the office today for follow-up of ADD, anxeity, sleep and depression. Good except tough time losing dog.  Still getting benefit from the medication for depression and anxiety.  Patient reports stable mood and denies depressed or irritable moods.  Patient denies any recent difficulty with anxiety.  Patient denies difficulty with sleep initiation or maintenance. Denies appetite disturbance.  Patient reports that energy and motivation have been good.  Patient denies any difficulty with concentration.  Patient denies any suicidal ideation. Needs Xanax for sleep .-05-1mg  hs.  Disc concerns about memory in detail.  Function ok but losese things.  Satisfied with the Concerta for ADD.  Is forgetful but not sure why.    Review of Systems:  Review of Systems  Neurological: Negative for tremors and weakness.  Psychiatric/Behavioral: Positive for decreased concentration. Negative for agitation, behavioral problems, confusion, dysphoric mood, hallucinations, self-injury, sleep disturbance and suicidal ideas. The patient is not nervous/anxious and is not hyperactive.     Medications: I have reviewed the patient's current medications.  Current Outpatient Medications  Medication Sig Dispense Refill  . ALPRAZolam (XANAX) 1 MG tablet Take 1 mg by mouth. 1 1/2 tabs qhs prn    . Ascorbic Acid (VITAMIN C PO) Take 1 tablet by mouth daily.    Marland Kitchen buPROPion (WELLBUTRIN XL) 300 MG 24 hr tablet Take 300 mg by mouth daily.    Marland Kitchen esomeprazole (NEXIUM) 40 MG capsule TAKE 1 CAPSULE BY MOUTH DAILY 90 capsule 0  . FLUoxetine (PROZAC) 20 MG capsule Take 20 mg by mouth daily.    . methylphenidate 36 MG PO CR tablet Take 72 mg by mouth every morning.      Marland Kitchen NASONEX 50 MCG/ACT nasal spray PLACE 2 SPRAYS INTO THE NOSE DAILY AS NEEDED. 17 g 5  . VENTOLIN HFA 108 (90 BASE) MCG/ACT inhaler INHALE 2 PUFFS BY MOUTH EVERY 6 HOURS AS NEEDED 18 g 2  . VALSARTAN PO Take by mouth.     No current facility-administered medications for this visit.     Medication Side Effects: None  Allergies:  Allergies  Allergen Reactions  . Meperidine Hcl     nausea  . Metoprolol     Nausea and inability to increase heart rate with cardiovascular exercise  . Oxycodone-Acetaminophen     nausea  . Sulfa Antibiotics     Nausea vomiting     Past Medical History:  Diagnosis Date  . Allergic rhinitis    seasonal  . Asthma    EIB & seasonal allergen trigger  . Depression   . Hypertension   . Pain in shoulder    right side  . SOB (shortness of breath) on exertion     Family History  Problem Relation Age of Onset  . Stroke Mother 60  . Dementia Mother   . Stroke Father 23  . Heart disease Brother 84       LAD stenting  . Colon cancer Maternal Uncle   . Diabetes Son        Type 1  . Prostate cancer Maternal Uncle   . Stroke Paternal Grandmother        mid 20s  . Hypertension  Paternal Grandmother     Social History   Socioeconomic History  . Marital status: Married    Spouse name: Not on file  . Number of children: Not on file  . Years of education: Not on file  . Highest education level: Not on file  Occupational History  . Not on file  Social Needs  . Financial resource strain: Not on file  . Food insecurity:    Worry: Not on file    Inability: Not on file  . Transportation needs:    Medical: Not on file    Non-medical: Not on file  Tobacco Use  . Smoking status: Former Smoker    Types: Cigarettes    Last attempt to quit: 04/24/1976    Years since quitting: 41.9  . Smokeless tobacco: Never Used  . Tobacco comment: smoked [age], up to 1ppd  Substance and Sexual Activity  . Alcohol use: Yes    Comment: daily  . Drug use: No  .  Sexual activity: Not on file  Lifestyle  . Physical activity:    Days per week: Not on file    Minutes per session: Not on file  . Stress: Not on file  Relationships  . Social connections:    Talks on phone: Not on file    Gets together: Not on file    Attends religious service: Not on file    Active member of club or organization: Not on file    Attends meetings of clubs or organizations: Not on file    Relationship status: Not on file  . Intimate partner violence:    Fear of current or ex partner: Not on file    Emotionally abused: Not on file    Physically abused: Not on file    Forced sexual activity: Not on file  Other Topics Concern  . Not on file  Social History Narrative  . Not on file    Past Medical History, Surgical history, Social history, and Family history were reviewed and updated as appropriate.   M had dementia onset 29's.  Please see review of systems for further details on the patient's review from today.   Objective:   Physical Exam:  There were no vitals taken for this visit.  Physical Exam  Constitutional: She is oriented to person, place, and time. She appears well-developed. No distress.  Musculoskeletal: She exhibits no deformity.  Neurological: She is alert and oriented to person, place, and time. She displays no tremor. Coordination and gait normal.  Psychiatric: She has a normal mood and affect. Her speech is normal and behavior is normal. Judgment and thought content normal. Her mood appears not anxious. Her affect is not angry, not blunt, not labile and not inappropriate. Cognition and memory are normal. She does not exhibit a depressed mood. She expresses no homicidal and no suicidal ideation. She expresses no suicidal plans and no homicidal plans.  Insight intact. No auditory or visual hallucinations. No delusions.  She is attentive.    Lab Review:     Component Value Date/Time   NA 135 05/18/2014 1321   K 3.7 05/18/2014 1321   CL 101  05/18/2014 1321   CO2 26 05/18/2014 1321   GLUCOSE 100 (H) 05/18/2014 1321   BUN 15 05/18/2014 1321   CREATININE 0.79 05/18/2014 1321   CALCIUM 10.1 05/18/2014 1321   PROT 7.3 05/18/2014 1321   ALBUMIN 4.5 05/18/2014 1321   AST 25 05/18/2014 1321   ALT 18 05/18/2014 1321  ALKPHOS 45 05/18/2014 1321   BILITOT 0.5 05/18/2014 1321   GFRNONAA 88 (L) 10/07/2013 0840   GFRAA >90 10/07/2013 0840       Component Value Date/Time   WBC 6.1 05/18/2014 1321   RBC 3.88 05/18/2014 1321   HGB 12.3 05/18/2014 1321   HCT 36.1 05/18/2014 1321   PLT 322.0 05/18/2014 1321   MCV 93.3 05/18/2014 1321   MCH 31.8 10/07/2013 0840   MCHC 34.1 05/18/2014 1321   RDW 12.7 05/18/2014 1321   LYMPHSABS 2.4 05/18/2014 1321   MONOABS 0.4 05/18/2014 1321   EOSABS 0.1 05/18/2014 1321   BASOSABS 0.0 05/18/2014 1321    No results found for: POCLITH, LITHIUM   No results found for: PHENYTOIN, PHENOBARB, VALPROATE, CBMZ   .res Assessment: Plan:    Attention deficit hyperactivity disorder (ADHD), predominantly inattentive type  Insomnia due to mental condition  Major depression in complete remission ([place])   RO MCI  Overall good response to medication with the exception of her memory concerns.   Option NAC for mild cognitive complaints. Disc Dr. Olene Floss article on Lithium and memory DT mother's hx dementia. Counseled patient regarding potential benefits, risks, and side effects of lithium to include potential risk of lithium affecting thyroid and renal function.  Discussed need for periodic lab monitoring to determine drug level and to assess for potential adverse effects.  Counseled patient regarding signs and symptoms of lithium toxicity and advised that they notify office immediately or seek urgent medical attention if experiencing these signs and symptoms.  Patient advised to contact office with any questions or concerns. She agrees to NAC and lithium 150 daily. Trial NAC (N-acetylcysteine) 600 mg  capsule 1 daily for memory and concentration  We discussed the short-term risks associated with benzodiazepines including sedation and increased fall risk among others.  Discussed long-term side effect risk including dependence, potential withdrawal symptoms, and the potential eventual dose-related risk of dementia. Lowest dosage of Bz.  Consider neuropsychological testing  Discussed potential benefits, risks, and side effects of stimulants with patient to include increased heart rate, palpitations, insomnia, increased anxiety, increased irritability, or decreased appetite.  Instructed patient to contact office if experiencing any significant tolerability issues.  This appt was 30 mins.  Fu 6 mos  Lynder Parents MD, DFAPA Please see After Visit Summary for patient specific instructions.  No future appointments.  No orders of the defined types were placed in this encounter.     -------------------------------

## 2018-03-25 ENCOUNTER — Other Ambulatory Visit: Payer: Self-pay

## 2018-03-25 MED ORDER — FLUOXETINE HCL 20 MG PO CAPS: 20.0000 mg | ORAL_CAPSULE | Freq: Every day | ORAL | 1 refills | Status: DC

## 2018-04-22 ENCOUNTER — Other Ambulatory Visit: Payer: Self-pay | Admitting: Psychiatry

## 2018-04-22 DIAGNOSIS — F5105 Insomnia due to other mental disorder: Secondary | ICD-10-CM

## 2018-04-22 MED ORDER — ALPRAZOLAM 1 MG PO TABS: 1.0000 mg | ORAL_TABLET | Freq: Every evening | ORAL | 1 refills | Status: DC | PRN

## 2018-04-22 NOTE — Progress Notes (Signed)
Alprazolam 1 mg refill 1-1-1/2 nightly as needed insomnia #135+1 refill

## 2018-05-02 ENCOUNTER — Telehealth: Payer: Self-pay | Admitting: Psychiatry

## 2018-05-02 NOTE — Telephone Encounter (Signed)
Patient requesting refill on Methylphenidate to be sent to [facility] on [address]. Please notify her at #[phone] if no answer please leave a msg.

## 2018-05-02 NOTE — Telephone Encounter (Signed)
rx's already on file needs to check with pharmacy

## 2018-05-31 DIAGNOSIS — R82998 Other abnormal findings in urine: Secondary | ICD-10-CM | POA: Diagnosis not present

## 2018-05-31 DIAGNOSIS — M859 Disorder of bone density and structure, unspecified: Secondary | ICD-10-CM | POA: Diagnosis not present

## 2018-05-31 DIAGNOSIS — E7849 Other hyperlipidemia: Secondary | ICD-10-CM | POA: Diagnosis not present

## 2018-05-31 DIAGNOSIS — I10 Essential (primary) hypertension: Secondary | ICD-10-CM | POA: Diagnosis not present

## 2018-06-07 DIAGNOSIS — K219 Gastro-esophageal reflux disease without esophagitis: Secondary | ICD-10-CM | POA: Diagnosis not present

## 2018-06-07 DIAGNOSIS — Z1331 Encounter for screening for depression: Secondary | ICD-10-CM | POA: Diagnosis not present

## 2018-06-07 DIAGNOSIS — Z0000 Encounter for general adult medical examination without abnormal findings: Secondary | ICD-10-CM | POA: Diagnosis not present

## 2018-06-07 DIAGNOSIS — F418 Other specified anxiety disorders: Secondary | ICD-10-CM | POA: Diagnosis not present

## 2018-06-07 DIAGNOSIS — E7849 Other hyperlipidemia: Secondary | ICD-10-CM | POA: Diagnosis not present

## 2018-06-07 DIAGNOSIS — I10 Essential (primary) hypertension: Secondary | ICD-10-CM | POA: Diagnosis not present

## 2018-06-07 DIAGNOSIS — G4709 Other insomnia: Secondary | ICD-10-CM | POA: Diagnosis not present

## 2018-06-07 DIAGNOSIS — M859 Disorder of bone density and structure, unspecified: Secondary | ICD-10-CM | POA: Diagnosis not present

## 2018-06-07 DIAGNOSIS — Z681 Body mass index (BMI) 19 or less, adult: Secondary | ICD-10-CM | POA: Diagnosis not present

## 2018-07-17 DIAGNOSIS — D692 Other nonthrombocytopenic purpura: Secondary | ICD-10-CM | POA: Diagnosis not present

## 2018-07-17 DIAGNOSIS — D225 Melanocytic nevi of trunk: Secondary | ICD-10-CM | POA: Diagnosis not present

## 2018-07-17 DIAGNOSIS — D2262 Melanocytic nevi of left upper limb, including shoulder: Secondary | ICD-10-CM | POA: Diagnosis not present

## 2018-07-17 DIAGNOSIS — D2261 Melanocytic nevi of right upper limb, including shoulder: Secondary | ICD-10-CM | POA: Diagnosis not present

## 2018-07-17 DIAGNOSIS — L603 Nail dystrophy: Secondary | ICD-10-CM | POA: Diagnosis not present

## 2018-07-17 DIAGNOSIS — D2272 Melanocytic nevi of left lower limb, including hip: Secondary | ICD-10-CM | POA: Diagnosis not present

## 2018-07-17 DIAGNOSIS — L821 Other seborrheic keratosis: Secondary | ICD-10-CM | POA: Diagnosis not present

## 2018-07-29 ENCOUNTER — Telehealth: Payer: Self-pay | Admitting: Psychiatry

## 2018-07-29 ENCOUNTER — Other Ambulatory Visit: Payer: Self-pay

## 2018-07-29 DIAGNOSIS — F900 Attention-deficit hyperactivity disorder, predominantly inattentive type: Secondary | ICD-10-CM

## 2018-07-29 MED ORDER — METHYLPHENIDATE HCL ER (OSM) 36 MG PO TBCR: 72.0000 mg | EXTENDED_RELEASE_TABLET | ORAL | 0 refills | Status: DC

## 2018-07-29 NOTE — Telephone Encounter (Signed)
Submitted for approval

## 2018-07-29 NOTE — Telephone Encounter (Signed)
Patient requesting a refill on the Concerta. Refill at the [facility] on [address].

## 2018-07-31 ENCOUNTER — Other Ambulatory Visit: Payer: Self-pay | Admitting: Psychiatry

## 2018-09-09 ENCOUNTER — Other Ambulatory Visit: Payer: Self-pay

## 2018-09-09 MED ORDER — LITHIUM CARBONATE 150 MG PO CAPS: 150.0000 mg | ORAL_CAPSULE | Freq: Every day | ORAL | 0 refills | Status: DC

## 2018-09-09 MED ORDER — FLUOXETINE HCL 20 MG PO CAPS: 20.0000 mg | ORAL_CAPSULE | Freq: Every day | ORAL | 0 refills | Status: DC

## 2018-09-17 ENCOUNTER — Ambulatory Visit: Payer: Medicare Other | Admitting: Psychiatry

## 2018-09-26 ENCOUNTER — Other Ambulatory Visit: Payer: Self-pay

## 2018-09-26 ENCOUNTER — Telehealth: Payer: Self-pay | Admitting: Psychiatry

## 2018-09-26 DIAGNOSIS — F900 Attention-deficit hyperactivity disorder, predominantly inattentive type: Secondary | ICD-10-CM

## 2018-09-26 MED ORDER — METHYLPHENIDATE HCL ER (OSM) 36 MG PO TBCR: 72.0000 mg | EXTENDED_RELEASE_TABLET | ORAL | 0 refills | Status: DC

## 2018-09-26 NOTE — Telephone Encounter (Signed)
Pt requesting a refill of the Concerta. Fill at the [facility] on [address]. Appt schedule for 6/22.

## 2018-09-26 NOTE — Telephone Encounter (Signed)
Theres not a Paediatric nurse on [place], submitted to [facility] on [address], listed on her profile.

## 2018-10-14 ENCOUNTER — Other Ambulatory Visit: Payer: Self-pay

## 2018-10-14 ENCOUNTER — Ambulatory Visit (INDEPENDENT_AMBULATORY_CARE_PROVIDER_SITE_OTHER): Payer: Medicare Other | Admitting: Psychiatry

## 2018-10-14 ENCOUNTER — Encounter: Payer: Self-pay | Admitting: Psychiatry

## 2018-10-14 DIAGNOSIS — F325 Major depressive disorder, single episode, in full remission: Secondary | ICD-10-CM | POA: Diagnosis not present

## 2018-10-14 DIAGNOSIS — F900 Attention-deficit hyperactivity disorder, predominantly inattentive type: Secondary | ICD-10-CM

## 2018-10-14 DIAGNOSIS — F5105 Insomnia due to other mental disorder: Secondary | ICD-10-CM

## 2018-10-14 MED ORDER — METHYLPHENIDATE HCL ER (OSM) 36 MG PO TBCR: 72.0000 mg | EXTENDED_RELEASE_TABLET | ORAL | 0 refills | Status: DC

## 2018-10-14 MED ORDER — ALPRAZOLAM 1 MG PO TABS: 1.0000 mg | ORAL_TABLET | Freq: Every evening | ORAL | 1 refills | Status: DC | PRN

## 2018-10-14 MED ORDER — LITHIUM CARBONATE 150 MG PO CAPS: 150.0000 mg | ORAL_CAPSULE | Freq: Every day | ORAL | 3 refills | Status: DC

## 2018-10-14 MED ORDER — FLUOXETINE HCL 20 MG PO CAPS: 20.0000 mg | ORAL_CAPSULE | Freq: Every day | ORAL | 3 refills | Status: DC

## 2018-10-14 MED ORDER — BUPROPION HCL ER (XL) 300 MG PO TB24: 300.0000 mg | ORAL_TABLET | Freq: Every morning | ORAL | 3 refills | Status: DC

## 2018-10-14 NOTE — Progress Notes (Signed)
ABBEE CREMEENS [id number] [birth year] 68 y.o.  Subjective:   Patient ID:  Jessica [name] is a 68 y.o. (DOB Apr 12, 1951) female.  Chief Complaint:  Chief Complaint  Patient presents with  . ADHD    med management    HPI Jessica [name] presents to the office today for follow-up of ADD, anxeity, sleep and depression.  Last visit November without med changes.  Overall satisfied with meds.  Great overall.  Good except tough time losing dog.  Still getting benefit from the medication for depression and anxiety.  Patient reports stable mood and denies depressed or irritable moods.  Patient denies any recent difficulty with anxiety.  Patient denies difficulty with sleep initiation or maintenance. Denies appetite disturbance.  Patient reports that energy and motivation have been good.  Patient denies any difficulty with concentration.  Patient denies any suicidal ideation. Needs Xanax for sleep .-05-1mg  hs.  Disc concerns about memory in detail.  Function ok but losese things.  Satisfied with the Concerta for ADD.  Is forgetful but not sure why.    Past Psychiatric Medication Trials:  Abilify SE, trazodone, fluoxetine 20,  Wellbutrin 536, Concerta 72, Xanax hs, Ambien, Lexapro fatigue   Review of Systems:  Review of Systems  Neurological: Negative for tremors and weakness.  Psychiatric/Behavioral: Positive for decreased concentration. Negative for agitation, behavioral problems, confusion, dysphoric mood, hallucinations, self-injury, sleep disturbance and suicidal ideas. The patient is not nervous/anxious and is not hyperactive.     Medications: I have reviewed the patient's current medications.  Current Outpatient Medications  Medication Sig Dispense Refill  . ALPRAZolam (XANAX) 1 MG tablet Take 1-1.5 tablets (1-1.5 mg total) by mouth at bedtime as needed for anxiety. 135 tablet 1  . Ascorbic Acid (VITAMIN C PO) Take 1 tablet by mouth daily.    Marland Kitchen buPROPion (WELLBUTRIN XL) 300 MG 24 hr tablet  TAKE 1 TABLET EVERY MORNING 90 tablet 1  . esomeprazole (NEXIUM) 40 MG capsule TAKE 1 CAPSULE BY MOUTH DAILY 90 capsule 0  . FLUoxetine (PROZAC) 20 MG capsule Take 1 capsule (20 mg total) by mouth daily. 90 capsule 0  . lithium carbonate 150 MG capsule Take 1 capsule (150 mg total) by mouth at bedtime. 90 capsule 0  . methylphenidate (CONCERTA) 36 MG PO CR tablet Take 2 tablets (72 mg total) by mouth every morning. 60 tablet 0  . methylphenidate (CONCERTA) 36 MG PO CR tablet Take 2 tablets (72 mg total) by mouth every morning. 60 tablet 0  . methylphenidate 36 MG PO CR tablet Take 2 tablets (72 mg total) by mouth every morning. 60 tablet 0  . NASONEX 50 MCG/ACT nasal spray PLACE 2 SPRAYS INTO THE NOSE DAILY AS NEEDED. 17 g 5  . VALSARTAN PO Take by mouth.    . VENTOLIN HFA 108 (90 BASE) MCG/ACT inhaler INHALE 2 PUFFS BY MOUTH EVERY 6 HOURS AS NEEDED 18 g 2   No current facility-administered medications for this visit.     Medication Side Effects: None  Allergies:  Allergies  Allergen Reactions  . Meperidine Hcl     nausea  . Metoprolol     Nausea and inability to increase heart rate with cardiovascular exercise  . Oxycodone-Acetaminophen     nausea  . Sulfa Antibiotics     Nausea vomiting     Past Medical History:  Diagnosis Date  . Allergic rhinitis    seasonal  . Asthma    EIB & seasonal allergen trigger  . Depression   .  Hypertension   . Pain in shoulder    right side  . SOB (shortness of breath) on exertion     Family History  Problem Relation Age of Onset  . Stroke Mother 34  . Dementia Mother   . Stroke Father 68  . Heart disease Brother 14       LAD stenting  . Colon cancer Maternal Uncle   . Diabetes Son        Type 1  . Prostate cancer Maternal Uncle   . Stroke Paternal Grandmother        mid 72s  . Hypertension Paternal Grandmother     Social History   Socioeconomic History  . Marital status: Married    Spouse name: Not on file  . Number of  children: Not on file  . Years of education: Not on file  . Highest education level: Not on file  Occupational History  . Not on file  Social Needs  . Financial resource strain: Not on file  . Food insecurity    Worry: Not on file    Inability: Not on file  . Transportation needs    Medical: Not on file    Non-medical: Not on file  Tobacco Use  . Smoking status: Former Smoker    Types: Cigarettes    Quit date: 04/24/1976    Years since quitting: 42.5  . Smokeless tobacco: Never Used  . Tobacco comment: smoked 15-27, up to 1ppd  Substance and Sexual Activity  . Alcohol use: Yes    Comment: daily  . Drug use: No  . Sexual activity: Not on file  Lifestyle  . Physical activity    Days per week: Not on file    Minutes per session: Not on file  . Stress: Not on file  Relationships  . Social Herbalist on phone: Not on file    Gets together: Not on file    Attends religious service: Not on file    Active member of club or organization: Not on file    Attends meetings of clubs or organizations: Not on file    Relationship status: Not on file  . Intimate partner violence    Fear of current or ex partner: Not on file    Emotionally abused: Not on file    Physically abused: Not on file    Forced sexual activity: Not on file  Other Topics Concern  . Not on file  Social History Narrative  . Not on file    Past Medical History, Surgical history, Social history, and Family history were reviewed and updated as appropriate.   M had dementia onset 92's.  Please see review of systems for further details on the patient's review from today.   Objective:   Physical Exam:  There were no vitals taken for this visit.  Physical Exam Constitutional:      General: She is not in acute distress.    Appearance: She is well-developed.  Musculoskeletal:        General: No deformity.  Neurological:     Mental Status: She is alert and oriented to person, place, and time.      Motor: No tremor.     Coordination: Coordination normal.     Gait: Gait normal.  Psychiatric:        Attention and Perception: She is attentive.        Mood and Affect: Mood is not anxious or depressed. Affect is not labile,  blunt, angry or inappropriate.        Speech: Speech normal.        Behavior: Behavior normal.        Thought Content: Thought content normal. Thought content does not include homicidal or suicidal ideation. Thought content does not include homicidal or suicidal plan.        Cognition and Memory: Cognition normal.        Judgment: Judgment normal.     Comments: Insight intact. No auditory or visual hallucinations. No delusions.      Lab Review:     Component Value Date/Time   NA 135 05/18/2014 1321   K 3.7 05/18/2014 1321   CL 101 05/18/2014 1321   CO2 26 05/18/2014 1321   GLUCOSE 100 (H) 05/18/2014 1321   BUN 15 05/18/2014 1321   CREATININE 0.79 05/18/2014 1321   CALCIUM 10.1 05/18/2014 1321   PROT 7.3 05/18/2014 1321   ALBUMIN 4.5 05/18/2014 1321   AST 25 05/18/2014 1321   ALT 18 05/18/2014 1321   ALKPHOS 45 05/18/2014 1321   BILITOT 0.5 05/18/2014 1321   GFRNONAA 88 (L) 10/07/2013 0840   GFRAA >90 10/07/2013 0840       Component Value Date/Time   WBC 6.1 05/18/2014 1321   RBC 3.88 05/18/2014 1321   HGB 12.3 05/18/2014 1321   HCT 36.1 05/18/2014 1321   PLT 322.0 05/18/2014 1321   MCV 93.3 05/18/2014 1321   MCH 31.8 10/07/2013 0840   MCHC 34.1 05/18/2014 1321   RDW 12.7 05/18/2014 1321   LYMPHSABS 2.4 05/18/2014 1321   MONOABS 0.4 05/18/2014 1321   EOSABS 0.1 05/18/2014 1321   BASOSABS 0.0 05/18/2014 1321    No results found for: POCLITH, LITHIUM   No results found for: PHENYTOIN, PHENOBARB, VALPROATE, CBMZ   .res Assessment: Plan:    Dashae was seen today for adhd and anxiety.  Diagnoses and all orders for this visit:  Major depression in complete remission [facility])  Attention deficit hyperactivity disorder (ADHD), predominantly  inattentive type  Insomnia due to mental condition   RO MCI  Overall good response to medication including of her memory concerns are better.  Option NAC for mild cognitive complaints. Disc Dr. Olene Floss article on Lithium and memory DT mother's hx dementia. Counseled patient regarding potential benefits, risks, and side effects of lithium to include potential risk of lithium affecting thyroid and renal function.  Discussed need for periodic lab monitoring to determine drug level and to assess for potential adverse effects.  Counseled patient regarding signs and symptoms of lithium toxicity and advised that they notify office immediately or seek urgent medical attention if experiencing these signs and symptoms.  Patient advised to contact office with any questions or concerns. She agrees to NAC and lithium 150 daily. Trial NAC (N-acetylcysteine) 600 mg capsule 1 daily for memory and concentration  We discussed the short-term risks associated with benzodiazepines including sedation and increased fall risk among others.  Discussed long-term side effect risk including dependence, potential withdrawal symptoms, and the potential eventual dose-related risk of dementia. Lowest dosage of Bz.  Consider neuropsychological testing  Discussed potential benefits, risks, and side effects of stimulants with patient to include increased heart rate, palpitations, insomnia, increased anxiety, increased irritability, or decreased appetite.  Instructed patient to contact office if experiencing any significant tolerability issues.  This appt was 30 mins.  Fu 6 mos  Lynder Parents MD, DFAPA Please see After Visit Summary for patient specific instructions.  No future appointments.  No orders of the defined types were placed in this encounter.     -------------------------------

## 2018-11-15 ENCOUNTER — Telehealth: Payer: Self-pay | Admitting: Psychiatry

## 2018-11-18 NOTE — Telephone Encounter (Signed)
Not needed

## 2018-11-20 ENCOUNTER — Other Ambulatory Visit: Payer: Self-pay

## 2018-11-20 ENCOUNTER — Telehealth: Payer: Self-pay | Admitting: Psychiatry

## 2018-11-20 DIAGNOSIS — F900 Attention-deficit hyperactivity disorder, predominantly inattentive type: Secondary | ICD-10-CM

## 2018-11-20 MED ORDER — METHYLPHENIDATE HCL ER (OSM) 36 MG PO TBCR: 72.0000 mg | EXTENDED_RELEASE_TABLET | ORAL | 0 refills | Status: DC

## 2018-11-20 NOTE — Telephone Encounter (Signed)
Thank you, Yes the pharmacy called to let us know and they will cancel all 3 rx's there. Will pend for provider to send to [facility]

## 2018-11-20 NOTE — Telephone Encounter (Signed)
Patient called and said that the concerta is too expemsive at [facility] and that she needs it sent to the [facility] on [address] because that is where she got it before.

## 2019-01-03 ENCOUNTER — Telehealth: Payer: Self-pay | Admitting: Psychiatry

## 2019-01-03 ENCOUNTER — Other Ambulatory Visit: Payer: Self-pay

## 2019-01-03 ENCOUNTER — Other Ambulatory Visit: Payer: Self-pay | Admitting: Psychiatry

## 2019-01-03 MED ORDER — BUPROPION HCL ER (XL) 150 MG PO TB24: 150.0000 mg | ORAL_TABLET | Freq: Every day | ORAL | 0 refills | Status: DC

## 2019-01-03 NOTE — Telephone Encounter (Signed)
Reduce Wellbutrin to 150 mg for 1 month which will resolve SE.  If not depressed, she can stop it.

## 2019-01-03 NOTE — Telephone Encounter (Signed)
Rx for Wellbutrin XL 150 mg submitted to [facility]

## 2019-01-03 NOTE — Telephone Encounter (Signed)
Pt. Made aware and verbalized understanding. She states she is not depressed but would like to try it at 150 Mg. Can you please send in an RX for the 150 Mg of Wellbutrin. She uses [facility] on [address]. If it is not called in today she will split the 300 Mg until she receives the new Rx.

## 2019-01-03 NOTE — Telephone Encounter (Signed)
Pt stated the Wellbutrin is making her dizzy, and shaky on one side. Can we decrease dosage to 150 or wean off completely?

## 2019-01-29 DIAGNOSIS — R05 Cough: Secondary | ICD-10-CM | POA: Diagnosis not present

## 2019-01-29 DIAGNOSIS — R6883 Chills (without fever): Secondary | ICD-10-CM | POA: Diagnosis not present

## 2019-01-29 DIAGNOSIS — J302 Other seasonal allergic rhinitis: Secondary | ICD-10-CM | POA: Diagnosis not present

## 2019-01-29 DIAGNOSIS — K219 Gastro-esophageal reflux disease without esophagitis: Secondary | ICD-10-CM | POA: Diagnosis not present

## 2019-01-29 DIAGNOSIS — Z20818 Contact with and (suspected) exposure to other bacterial communicable diseases: Secondary | ICD-10-CM | POA: Diagnosis not present

## 2019-02-07 DIAGNOSIS — Z23 Encounter for immunization: Secondary | ICD-10-CM | POA: Diagnosis not present

## 2019-03-12 DIAGNOSIS — Z1231 Encounter for screening mammogram for malignant neoplasm of breast: Secondary | ICD-10-CM | POA: Diagnosis not present

## 2019-03-12 DIAGNOSIS — M8588 Other specified disorders of bone density and structure, other site: Secondary | ICD-10-CM | POA: Diagnosis not present

## 2019-03-12 DIAGNOSIS — M810 Age-related osteoporosis without current pathological fracture: Secondary | ICD-10-CM | POA: Diagnosis not present

## 2019-03-12 DIAGNOSIS — Z124 Encounter for screening for malignant neoplasm of cervix: Secondary | ICD-10-CM | POA: Diagnosis not present

## 2019-03-12 DIAGNOSIS — Z681 Body mass index (BMI) 19 or less, adult: Secondary | ICD-10-CM | POA: Diagnosis not present

## 2019-03-12 DIAGNOSIS — N958 Other specified menopausal and perimenopausal disorders: Secondary | ICD-10-CM | POA: Diagnosis not present

## 2019-03-12 DIAGNOSIS — M8580 Other specified disorders of bone density and structure, unspecified site: Secondary | ICD-10-CM | POA: Diagnosis not present

## 2019-03-18 ENCOUNTER — Other Ambulatory Visit: Payer: Self-pay

## 2019-03-18 DIAGNOSIS — Z20828 Contact with and (suspected) exposure to other viral communicable diseases: Secondary | ICD-10-CM | POA: Diagnosis not present

## 2019-03-18 DIAGNOSIS — Z20822 Contact with and (suspected) exposure to covid-19: Secondary | ICD-10-CM

## 2019-03-20 LAB — NOVEL CORONAVIRUS, NAA: SARS-CoV-2, NAA: NOT DETECTED

## 2019-03-31 ENCOUNTER — Telehealth: Payer: Self-pay | Admitting: Psychiatry

## 2019-03-31 ENCOUNTER — Other Ambulatory Visit: Payer: Self-pay

## 2019-03-31 DIAGNOSIS — F900 Attention-deficit hyperactivity disorder, predominantly inattentive type: Secondary | ICD-10-CM

## 2019-03-31 MED ORDER — METHYLPHENIDATE HCL ER (OSM) 36 MG PO TBCR: 72.0000 mg | EXTENDED_RELEASE_TABLET | ORAL | 0 refills | Status: DC

## 2019-03-31 NOTE — Telephone Encounter (Signed)
Last refill 02/23/2019 Apt 04/08/2019 Pended for approval

## 2019-03-31 NOTE — Telephone Encounter (Signed)
Patient called and needs a refill on her concerta 36 mg to be sent to [facility] on [address]. Next appt 12/15

## 2019-04-04 ENCOUNTER — Telehealth: Payer: Self-pay | Admitting: Psychiatry

## 2019-04-04 ENCOUNTER — Other Ambulatory Visit: Payer: Self-pay

## 2019-04-04 DIAGNOSIS — F325 Major depressive disorder, single episode, in full remission: Secondary | ICD-10-CM

## 2019-04-04 MED ORDER — FLUOXETINE HCL 20 MG PO CAPS: 20.0000 mg | ORAL_CAPSULE | Freq: Every day | ORAL | 0 refills | Status: DC

## 2019-04-04 NOTE — Telephone Encounter (Signed)
Pt stated she will not get her Prozac from mail order in time. She request that it is filled at the [facility] on [address].

## 2019-04-06 ENCOUNTER — Other Ambulatory Visit: Payer: Self-pay | Admitting: Psychiatry

## 2019-04-06 DIAGNOSIS — F5105 Insomnia due to other mental disorder: Secondary | ICD-10-CM

## 2019-04-07 NOTE — Telephone Encounter (Signed)
Apt tomorrow 12/15

## 2019-04-08 ENCOUNTER — Other Ambulatory Visit: Payer: Self-pay

## 2019-04-08 ENCOUNTER — Ambulatory Visit (INDEPENDENT_AMBULATORY_CARE_PROVIDER_SITE_OTHER): Payer: Medicare Other | Admitting: Psychiatry

## 2019-04-08 ENCOUNTER — Encounter: Payer: Self-pay | Admitting: Psychiatry

## 2019-04-08 VITALS — BP 145/92 | HR 73

## 2019-04-08 DIAGNOSIS — F900 Attention-deficit hyperactivity disorder, predominantly inattentive type: Secondary | ICD-10-CM | POA: Diagnosis not present

## 2019-04-08 DIAGNOSIS — F325 Major depressive disorder, single episode, in full remission: Secondary | ICD-10-CM | POA: Diagnosis not present

## 2019-04-08 DIAGNOSIS — F5105 Insomnia due to other mental disorder: Secondary | ICD-10-CM | POA: Diagnosis not present

## 2019-04-08 MED ORDER — METHYLPHENIDATE HCL ER (OSM) 36 MG PO TBCR: 72.0000 mg | EXTENDED_RELEASE_TABLET | ORAL | 0 refills | Status: DC

## 2019-04-08 MED ORDER — ALPRAZOLAM 1 MG PO TABS: 1.0000 mg | ORAL_TABLET | Freq: Every evening | ORAL | 1 refills | Status: DC | PRN

## 2019-04-08 MED ORDER — FLUOXETINE HCL 20 MG PO CAPS: 20.0000 mg | ORAL_CAPSULE | Freq: Every day | ORAL | 3 refills | Status: DC

## 2019-04-08 NOTE — Progress Notes (Signed)
TREACY ANASTASIO [id number] 1950-12-17 68 y.o.  Subjective:   Patient ID:  Jessica [name] is a 68 y.o. (DOB July 10, 1950) female.  Chief Complaint:  Chief Complaint  Patient presents with  . Follow-up    Medication Management  . Depression    Medication Management  . ADHD  . Sleeping Problem    Follow-up    HPI Jessica [name] presents to the office today for follow-up of ADD, anxeity, sleep and depression.  Last visit June 2020.  She was having some cognitive complaints and it was suggested that she add lithium 150 mg daily for his neuro protective effect as well as N-acetylcysteine 600 mg daily for cognitive benefit.  No other meds were changed.  She called back September 11 stating Wellbutrin was making her dizzy and shaky and she was going to reduce the dose to 150 mg and potentially stop it.  She remained on fluoxetine 20 mg, Concerta 36 mg and alprazolam 1 to 1-1/2 mg nightly as needed insomnia  Good overall except son still at home and causing problems.  No depression relapse off Wellbutrin.  Stopped it DT tremor which resolved.  Overall satisfied with meds.  Great overall. Tolerating.    Good except tough time losing dog.  Still getting benefit from the medication for depression and anxiety.  Patient reports stable mood and denies depressed or irritable moods.  Patient denies any recent difficulty with anxiety.  Patient denies difficulty with sleep initiation or maintenance. Denies appetite disturbance.  Patient reports that energy and motivation have been good.  Patient denies any difficulty with concentration.  Patient denies any suicidal ideation. Needs Xanax for sleep   Usually 1mg  hs.  Disc concerns about memory in detail.  Function ok but losese things.  Satisfied with the Concerta for ADD. Duration about 12 hours.   Is forgetful but not sure why.    Past Psychiatric Medication Trials:  Abilify SE, trazodone, fluoxetine 20,  Wellbutrin [id number], Concerta 72, Xanax hs, Ambien,  Lexapro fatigue   Review of Systems:  Review of Systems  Neurological: Negative for dizziness, tremors and weakness.  Psychiatric/Behavioral: Positive for decreased concentration. Negative for agitation, behavioral problems, confusion, dysphoric mood, hallucinations, self-injury, sleep disturbance and suicidal ideas. The patient is not nervous/anxious and is not hyperactive.     Medications: I have reviewed the patient's current medications.  Current Outpatient Medications  Medication Sig Dispense Refill  . Acetylcysteine 600 MG CAPS Take 600 mg by mouth daily.    Marland Kitchen ALPRAZolam (XANAX) 1 MG tablet Take 1-1.5 tablets (1-1.5 mg total) by mouth at bedtime as needed for anxiety. 135 tablet 1  . Ascorbic Acid (VITAMIN C PO) Take 1 tablet by mouth daily.    Marland Kitchen esomeprazole (NEXIUM) 40 MG capsule TAKE 1 CAPSULE BY MOUTH DAILY 90 capsule 0  . FLUoxetine (PROZAC) 20 MG capsule Take 1 capsule (20 mg total) by mouth daily. 90 capsule 3  . lithium carbonate 150 MG capsule Take 1 capsule (150 mg total) by mouth at bedtime. 90 capsule 3  . methylphenidate (CONCERTA) 36 MG PO CR tablet Take 2 tablets (72 mg total) by mouth every morning. 60 tablet 0  . [START ON 05/06/2019] methylphenidate (CONCERTA) 36 MG PO CR tablet Take 2 tablets (72 mg total) by mouth every morning. 60 tablet 0  . [START ON 06/03/2019] methylphenidate 36 MG PO CR tablet Take 2 tablets (72 mg total) by mouth every morning. 60 tablet 0  . NASONEX 50 MCG/ACT nasal spray  PLACE 2 SPRAYS INTO THE NOSE DAILY AS NEEDED. 17 g 5  . VALSARTAN PO Take by mouth.    . VENTOLIN HFA 108 (90 BASE) MCG/ACT inhaler INHALE 2 PUFFS BY MOUTH EVERY 6 HOURS AS NEEDED 18 g 2   No current facility-administered medications for this visit.    Medication Side Effects: None  Allergies:  Allergies  Allergen Reactions  . Demerol  [Meperidine] Nausea And Vomiting  . Meperidine Hcl     nausea  . Metoprolol     Nausea and inability to increase heart rate with  cardiovascular exercise  . Oxycodone-Acetaminophen     nausea  . Sulfa Antibiotics     Nausea vomiting     Past Medical History:  Diagnosis Date  . Allergic rhinitis    seasonal  . Asthma    EIB & seasonal allergen trigger  . Depression   . Hypertension   . Pain in shoulder    right side  . SOB (shortness of breath) on exertion     Family History  Problem Relation Age of Onset  . Stroke Mother 23  . Dementia Mother   . Stroke Father 67  . Heart disease Brother 96       LAD stenting  . Colon cancer Maternal Uncle   . Diabetes Son        Type 1  . Prostate cancer Maternal Uncle   . Stroke Paternal Grandmother        mid 70s  . Hypertension Paternal Grandmother     Social History   Socioeconomic History  . Marital status: Married    Spouse name: Not on file  . Number of children: Not on file  . Years of education: Not on file  . Highest education level: Not on file  Occupational History  . Not on file  Tobacco Use  . Smoking status: Former Smoker    Types: Cigarettes    Quit date: 04/24/1976    Years since quitting: 42.9  . Smokeless tobacco: Never Used  . Tobacco comment: smoked 16-27, up to 1ppd  Substance and Sexual Activity  . Alcohol use: Yes    Comment: daily  . Drug use: No  . Sexual activity: Not on file  Other Topics Concern  . Not on file  Social History Narrative  . Not on file   Social Determinants of Health   Financial Resource Strain:   . Difficulty of Paying Living Expenses: Not on file  Food Insecurity:   . Worried About Charity fundraiser in the Last Year: Not on file  . Ran Out of Food in the Last Year: Not on file  Transportation Needs:   . Lack of Transportation (Medical): Not on file  . Lack of Transportation (Non-Medical): Not on file  Physical Activity:   . Days of Exercise per Week: Not on file  . Minutes of Exercise per Session: Not on file  Stress:   . Feeling of Stress : Not on file  Social Connections:   .  Frequency of Communication with Friends and Family: Not on file  . Frequency of Social Gatherings with Friends and Family: Not on file  . Attends Religious Services: Not on file  . Active Member of Clubs or Organizations: Not on file  . Attends Archivist Meetings: Not on file  . Marital Status: Not on file  Intimate Partner Violence:   . Fear of Current or Ex-Partner: Not on file  . Emotionally Abused:  Not on file  . Physically Abused: Not on file  . Sexually Abused: Not on file    Past Medical History, Surgical history, Social history, and Family history were reviewed and updated as appropriate.   M had dementia onset 54's.  Please see review of systems for further details on the patient's review from today.   Objective:   Physical Exam:  BP (!) 145/92   Pulse 73   Physical Exam Constitutional:      General: She is not in acute distress.    Appearance: She is well-developed.  Musculoskeletal:        General: No deformity.  Neurological:     Mental Status: She is alert and oriented to person, place, and time.     Motor: No tremor.     Coordination: Coordination normal.     Gait: Gait normal.  Psychiatric:        Attention and Perception: She is attentive.        Mood and Affect: Mood is not anxious or depressed. Affect is not labile, blunt, angry or inappropriate.        Speech: Speech normal. Speech is not slurred.        Behavior: Behavior normal.        Thought Content: Thought content normal. Thought content does not include homicidal or suicidal ideation. Thought content does not include homicidal or suicidal plan.        Cognition and Memory: Cognition normal.        Judgment: Judgment normal.     Comments: Insight intact. No auditory or visual hallucinations. No delusions.      Lab Review:     Component Value Date/Time   NA 135 05/18/2014 1321   K 3.7 05/18/2014 1321   CL 101 05/18/2014 1321   CO2 26 05/18/2014 1321   GLUCOSE 100 (H)  05/18/2014 1321   BUN 15 05/18/2014 1321   CREATININE 0.79 05/18/2014 1321   CALCIUM 10.1 05/18/2014 1321   PROT 7.3 05/18/2014 1321   ALBUMIN 4.5 05/18/2014 1321   AST 25 05/18/2014 1321   ALT 18 05/18/2014 1321   ALKPHOS 45 05/18/2014 1321   BILITOT 0.5 05/18/2014 1321   GFRNONAA 88 (L) 10/07/2013 0840   GFRAA >90 10/07/2013 0840       Component Value Date/Time   WBC 6.1 05/18/2014 1321   RBC 3.88 05/18/2014 1321   HGB 12.3 05/18/2014 1321   HCT 36.1 05/18/2014 1321   PLT 322.0 05/18/2014 1321   MCV 93.3 05/18/2014 1321   MCH 31.8 10/07/2013 0840   MCHC 34.1 05/18/2014 1321   RDW 12.7 05/18/2014 1321   LYMPHSABS 2.4 05/18/2014 1321   MONOABS 0.4 05/18/2014 1321   EOSABS 0.1 05/18/2014 1321   BASOSABS 0.0 05/18/2014 1321    No results found for: POCLITH, LITHIUM   No results found for: PHENYTOIN, PHENOBARB, VALPROATE, CBMZ   .res Assessment: Plan:    Haydn was seen today for follow-up, depression, adhd and sleeping problem.  Diagnoses and all orders for this visit:  Attention deficit hyperactivity disorder (ADHD), predominantly inattentive type -     methylphenidate (CONCERTA) 36 MG PO CR tablet; Take 2 tablets (72 mg total) by mouth every morning. -     methylphenidate (CONCERTA) 36 MG PO CR tablet; Take 2 tablets (72 mg total) by mouth every morning. -     methylphenidate 36 MG PO CR tablet; Take 2 tablets (72 mg total) by mouth every morning.  Major depression  in complete remission (HCC) -     FLUoxetine (PROZAC) 20 MG capsule; Take 1 capsule (20 mg total) by mouth daily.  Insomnia due to mental condition -     ALPRAZolam (XANAX) 1 MG tablet; Take 1-1.5 tablets (1-1.5 mg total) by mouth at bedtime as needed for anxiety.   RO MCI  Overall good response to medication including of her memory concerns are better.  Option NAC for mild cognitive complaints. Disc Dr. Olene Floss article on Lithium and memory DT mother's hx dementia. Counseled patient regarding potential  benefits, risks, and side effects of lithium to include potential risk of lithium affecting thyroid and renal function.  Discussed need for periodic lab monitoring to determine drug level and to assess for potential adverse effects.  Counseled patient regarding signs and symptoms of lithium toxicity and advised that they notify office immediately or seek urgent medical attention if experiencing these signs and symptoms.  Patient advised to contact office with any questions or concerns. She agrees to continue NAC and lithium 150 daily. Continue NAC (N-acetylcysteine) 600 mg capsule 1 daily for memory and concentration  Has done well off the Wellbutrin.  We discussed the short-term risks associated with benzodiazepines including sedation and increased fall risk among others.  Discussed long-term side effect risk including dependence, potential withdrawal symptoms, and the potential eventual dose-related risk of dementia. Lowest dosage of Bz.  Consider neuropsychological testing  Still benefits from Concerta  At 36 mg daily. Discussed potential benefits, risks, and side effects of stimulants with patient to include increased heart rate, palpitations, insomnia, increased anxiety, increased irritability, or decreased appetite.  Instructed patient to contact office if experiencing any significant tolerability issues.  This appt was 30 mins.  Fu 6 mos  Lynder Parents MD, DFAPA Please see After Visit Summary for patient specific instructions.  No future appointments.  No orders of the defined types were placed in this encounter.     -------------------------------

## 2019-04-12 ENCOUNTER — Other Ambulatory Visit: Payer: Self-pay | Admitting: Psychiatry

## 2019-07-04 DIAGNOSIS — M859 Disorder of bone density and structure, unspecified: Secondary | ICD-10-CM | POA: Diagnosis not present

## 2019-07-04 DIAGNOSIS — E7849 Other hyperlipidemia: Secondary | ICD-10-CM | POA: Diagnosis not present

## 2019-07-07 DIAGNOSIS — K219 Gastro-esophageal reflux disease without esophagitis: Secondary | ICD-10-CM | POA: Diagnosis not present

## 2019-07-07 DIAGNOSIS — D649 Anemia, unspecified: Secondary | ICD-10-CM | POA: Diagnosis not present

## 2019-07-07 DIAGNOSIS — E785 Hyperlipidemia, unspecified: Secondary | ICD-10-CM | POA: Diagnosis not present

## 2019-07-07 DIAGNOSIS — F418 Other specified anxiety disorders: Secondary | ICD-10-CM | POA: Diagnosis not present

## 2019-07-07 DIAGNOSIS — R82998 Other abnormal findings in urine: Secondary | ICD-10-CM | POA: Diagnosis not present

## 2019-07-07 DIAGNOSIS — I129 Hypertensive chronic kidney disease with stage 1 through stage 4 chronic kidney disease, or unspecified chronic kidney disease: Secondary | ICD-10-CM | POA: Diagnosis not present

## 2019-07-07 DIAGNOSIS — Z0000 Encounter for general adult medical examination without abnormal findings: Secondary | ICD-10-CM | POA: Diagnosis not present

## 2019-07-07 DIAGNOSIS — M8580 Other specified disorders of bone density and structure, unspecified site: Secondary | ICD-10-CM | POA: Diagnosis not present

## 2019-07-07 DIAGNOSIS — I10 Essential (primary) hypertension: Secondary | ICD-10-CM | POA: Diagnosis not present

## 2019-09-03 ENCOUNTER — Other Ambulatory Visit: Payer: Self-pay

## 2019-09-03 ENCOUNTER — Telehealth: Payer: Self-pay | Admitting: Psychiatry

## 2019-09-03 DIAGNOSIS — F900 Attention-deficit hyperactivity disorder, predominantly inattentive type: Secondary | ICD-10-CM

## 2019-09-03 MED ORDER — METHYLPHENIDATE HCL ER (OSM) 36 MG PO TBCR: 72.0000 mg | EXTENDED_RELEASE_TABLET | ORAL | 0 refills | Status: DC

## 2019-09-03 NOTE — Telephone Encounter (Signed)
Last refill 07/24/2019  Pended for Dr. Clovis Pu

## 2019-09-03 NOTE — Telephone Encounter (Signed)
Pt requesting refill on METTHYLPHENIDATE. Please send to [facility] on [address].

## 2019-09-24 ENCOUNTER — Telehealth: Payer: Self-pay | Admitting: Psychiatry

## 2019-09-24 NOTE — Telephone Encounter (Signed)
Jessica [name] is unable to get the supplement, NAC, from the same place as she did before. Can you give her a recommendation where to find it and buy it?

## 2019-09-25 NOTE — Telephone Encounter (Signed)
LM with patient, now sure where she normally gets it ordered. We use [employer] or [email or web address]

## 2019-09-29 NOTE — Telephone Encounter (Signed)
Patient called back and given information

## 2019-10-06 ENCOUNTER — Other Ambulatory Visit: Payer: Self-pay

## 2019-10-06 ENCOUNTER — Encounter: Payer: Self-pay | Admitting: Psychiatry

## 2019-10-06 ENCOUNTER — Ambulatory Visit (INDEPENDENT_AMBULATORY_CARE_PROVIDER_SITE_OTHER): Payer: Medicare Other | Admitting: Psychiatry

## 2019-10-06 DIAGNOSIS — F5105 Insomnia due to other mental disorder: Secondary | ICD-10-CM | POA: Diagnosis not present

## 2019-10-06 DIAGNOSIS — F900 Attention-deficit hyperactivity disorder, predominantly inattentive type: Secondary | ICD-10-CM | POA: Diagnosis not present

## 2019-10-06 DIAGNOSIS — F325 Major depressive disorder, single episode, in full remission: Secondary | ICD-10-CM

## 2019-10-06 MED ORDER — METHYLPHENIDATE HCL ER (OSM) 36 MG PO TBCR: 72.0000 mg | EXTENDED_RELEASE_TABLET | ORAL | 0 refills | Status: DC

## 2019-10-06 NOTE — Progress Notes (Signed)
Jessica [name] [id number] 03-05-1951 69 y.o.  Subjective:   Patient ID:  Jessica [name] is a 69 y.o. (DOB [birth year]) female.  Chief Complaint:  Chief Complaint  Patient presents with  . Follow-up    Mood, ADD and meds.  . ADHD  . Sleeping Problem    HPI Jessica [name] presents to the office today for follow-up of ADD, anxeity, sleep and depression.  visit June 2020.  She was having some cognitive complaints and it was suggested that she add lithium 150 mg daily for his neuro protective effect as well as N-acetylcysteine 600 mg daily for cognitive benefit.  No other meds were changed.  She called back September 11 stating Wellbutrin was making her dizzy and shaky and she was going to reduce the dose to 150 mg and potentially stop it.  She remained on fluoxetine 20 mg, Concerta 36 mg and alprazolam 1 to 1-1/2 mg nightly as needed insomnia  December 2020 appointment with the following noted: Good overall except son still at home and causing problems. No depression relapse off Wellbutrin.  Stopped it DT tremor which resolved.  Overall satisfied with meds.  Great overall. Tolerating.   No meds were changed.  She continued Concerta 72 mg every morning, fluoxetine 20, lithium 150, alprazolam 1 mg 1 to 1-1/2 tablets nightly for sleep.  10/06/2019 appointment with the following noted:  lithium stopped DT elevated Cr and BUN. Doing OK overall. Continues other meds.  Sleep is OK with Xanax 1 mg hs usually..  No SE. Asked questions about Ketamine [facility] [place] for  depression for son.   Son has tried and failed multiple meds  Patient reports stable mood and denies depressed or irritable moods.  Patient denies any recent difficulty with anxiety.  Patient denies difficulty with sleep initiation or maintenance. Denies appetite disturbance.  Patient reports that energy and motivation have been good.  Patient denies any difficulty with concentration.  Patient denies any suicidal ideation. Needs Xanax for sleep    Usually 1mg  hs.  Disc concerns about memory in detail.  Function ok but losese things.  Satisfied with the Concerta for ADD. Duration about 12 hours.   Is forgetful but not sure why.    Past Psychiatric Medication Trials:  Abilify SE, trazodone, fluoxetine 20,  Wellbutrin 676, Concerta 72, Xanax hs, Ambien, Lexapro fatigue    Son dep and OCD.   Review of Systems:  Review of Systems  Neurological: Negative for dizziness, tremors and weakness.  Psychiatric/Behavioral: Positive for decreased concentration. Negative for agitation, behavioral problems, confusion, dysphoric mood, hallucinations, self-injury, sleep disturbance and suicidal ideas. The patient is not nervous/anxious and is not hyperactive.     Medications: I have reviewed the patient's current medications.  Current Outpatient Medications  Medication Sig Dispense Refill  . Acetylcysteine 600 MG CAPS Take 600 mg by mouth daily.    Marland Kitchen ALPRAZolam (XANAX) 1 MG tablet Take 1-1.5 tablets (1-1.5 mg total) by mouth at bedtime as needed for anxiety. 135 tablet 1  . Ascorbic Acid (VITAMIN C PO) Take 1 tablet by mouth daily.    Marland Kitchen esomeprazole (NEXIUM) 40 MG capsule TAKE 1 CAPSULE BY MOUTH DAILY 90 capsule 0  . FLUoxetine (PROZAC) 20 MG capsule Take 1 capsule (20 mg total) by mouth daily. 90 capsule 3  . [START ON 12/01/2019] methylphenidate (CONCERTA) 36 MG PO CR tablet Take 2 tablets (72 mg total) by mouth every morning. 60 tablet 0  . [START ON 11/03/2019] methylphenidate (CONCERTA) 36 MG  PO CR tablet Take 2 tablets (72 mg total) by mouth every morning. 60 tablet 0  . methylphenidate 36 MG PO CR tablet Take 2 tablets (72 mg total) by mouth every morning. 60 tablet 0  . NASONEX 50 MCG/ACT nasal spray PLACE 2 SPRAYS INTO THE NOSE DAILY AS NEEDED. 17 g 5  . VALSARTAN PO Take by mouth.    . VENTOLIN HFA 108 (90 BASE) MCG/ACT inhaler INHALE 2 PUFFS BY MOUTH EVERY 6 HOURS AS NEEDED 18 g 2   No current facility-administered medications for this  visit.    Medication Side Effects: None  Allergies:  Allergies  Allergen Reactions  . Demerol  [Meperidine] Nausea And Vomiting  . Meperidine Hcl     nausea  . Metoprolol     Nausea and inability to increase heart rate with cardiovascular exercise  . Oxycodone-Acetaminophen     nausea  . Sulfa Antibiotics     Nausea vomiting     Past Medical History:  Diagnosis Date  . Allergic rhinitis    seasonal  . Asthma    EIB & seasonal allergen trigger  . Depression   . Hypertension   . Pain in shoulder    right side  . SOB (shortness of breath) on exertion     Family History  Problem Relation Age of Onset  . Stroke Mother 22  . Dementia Mother   . Stroke Father 41  . Heart disease Brother 90       LAD stenting  . Colon cancer Maternal Uncle   . Diabetes Son        Type 1  . Prostate cancer Maternal Uncle   . Stroke Paternal Grandmother        mid 68s  . Hypertension Paternal Grandmother     Social History   Socioeconomic History  . Marital status: Married    Spouse name: Not on file  . Number of children: Not on file  . Years of education: Not on file  . Highest education level: Not on file  Occupational History  . Not on file  Tobacco Use  . Smoking status: Former Smoker    Types: Cigarettes    Quit date: 04/24/1976    Years since quitting: 43.4  . Smokeless tobacco: Never Used  . Tobacco comment: smoked [age], up to 1ppd  Substance and Sexual Activity  . Alcohol use: Yes    Comment: daily  . Drug use: No  . Sexual activity: Not on file  Other Topics Concern  . Not on file  Social History Narrative  . Not on file   Social Determinants of Health   Financial Resource Strain:   . Difficulty of Paying Living Expenses:   Food Insecurity:   . Worried About Charity fundraiser in the Last Year:   . Arboriculturist in the Last Year:   Transportation Needs:   . Film/video editor (Medical):   Marland Kitchen Lack of Transportation (Non-Medical):   Physical  Activity:   . Days of Exercise per Week:   . Minutes of Exercise per Session:   Stress:   . Feeling of Stress :   Social Connections:   . Frequency of Communication with Friends and Family:   . Frequency of Social Gatherings with Friends and Family:   . Attends Religious Services:   . Active Member of Clubs or Organizations:   . Attends Archivist Meetings:   Marland Kitchen Marital Status:   Intimate Production manager  Violence:   . Fear of Current or Ex-Partner:   . Emotionally Abused:   Marland Kitchen Physically Abused:   . Sexually Abused:     Past Medical History, Surgical history, Social history, and Family history were reviewed and updated as appropriate.   M had dementia onset 32's.  Please see review of systems for further details on the patient's review from today.   Objective:   Physical Exam:  There were no vitals taken for this visit.  Physical Exam Constitutional:      General: She is not in acute distress.    Appearance: She is well-developed.  Musculoskeletal:        General: No deformity.  Neurological:     Mental Status: She is alert and oriented to person, place, and time.     Motor: No tremor.     Coordination: Coordination normal.     Gait: Gait normal.  Psychiatric:        Attention and Perception: She is attentive.        Mood and Affect: Mood is not anxious or depressed. Affect is not labile, blunt, angry or inappropriate.        Speech: Speech normal. Speech is not slurred.        Behavior: Behavior normal.        Thought Content: Thought content normal. Thought content does not include homicidal or suicidal ideation. Thought content does not include homicidal or suicidal plan.        Cognition and Memory: Cognition normal.        Judgment: Judgment normal.     Comments: Insight intact. No auditory or visual hallucinations. No delusions.      Lab Review:     Component Value Date/Time   NA 135 05/18/2014 1321   K 3.7 05/18/2014 1321   CL 101 05/18/2014 1321    CO2 26 05/18/2014 1321   GLUCOSE 100 (H) 05/18/2014 1321   BUN 15 05/18/2014 1321   CREATININE 0.79 05/18/2014 1321   CALCIUM 10.1 05/18/2014 1321   PROT 7.3 05/18/2014 1321   ALBUMIN 4.5 05/18/2014 1321   AST 25 05/18/2014 1321   ALT 18 05/18/2014 1321   ALKPHOS 45 05/18/2014 1321   BILITOT 0.5 05/18/2014 1321   GFRNONAA 88 (L) 10/07/2013 0840   GFRAA >90 10/07/2013 0840       Component Value Date/Time   WBC 6.1 05/18/2014 1321   RBC 3.88 05/18/2014 1321   HGB 12.3 05/18/2014 1321   HCT 36.1 05/18/2014 1321   PLT 322.0 05/18/2014 1321   MCV 93.3 05/18/2014 1321   MCH 31.8 10/07/2013 0840   MCHC 34.1 05/18/2014 1321   RDW 12.7 05/18/2014 1321   LYMPHSABS 2.4 05/18/2014 1321   MONOABS 0.4 05/18/2014 1321   EOSABS 0.1 05/18/2014 1321   BASOSABS 0.0 05/18/2014 1321    No results found for: POCLITH, LITHIUM   No results found for: PHENYTOIN, PHENOBARB, VALPROATE, CBMZ   .res Assessment: Plan:    Charmain was seen today for follow-up, adhd and sleeping problem.  Diagnoses and all orders for this visit:  Major depression in complete remission ([place])  Attention deficit hyperactivity disorder (ADHD), predominantly inattentive type -     methylphenidate (CONCERTA) 36 MG PO CR tablet; Take 2 tablets (72 mg total) by mouth every morning. -     methylphenidate (CONCERTA) 36 MG PO CR tablet; Take 2 tablets (72 mg total) by mouth every morning. -     methylphenidate 36 MG PO CR  tablet; Take 2 tablets (72 mg total) by mouth every morning.  Insomnia due to mental condition   RO MCI  Overall good response to medication including of her memory concerns are better.  Continue NAC for mild cognitive complaints. Disc Dr. Olene Floss article on Lithium and memory DT mother's hx dementia.  No problems off the lithium DT elevation mildly in CR.  Level not in Epic. Continue NAC (N-acetylcysteine) 600 mg capsule 1 daily for memory and concentration. Disc recent availability problems.  Disc risk  LT PPI on suppressing B12 and to keep an eye on this with PCP bc of cog concerns.  We discussed the short-term risks associated with benzodiazepines including sedation and increased fall risk among others.  Discussed long-term side effect risk including dependence, potential withdrawal symptoms, and the potential eventual dose-related risk of dementia. Lowest dosage of Bz.  Consider neuropsychological testing  Still benefits from Concerta  At 72 mg daily. Discussed potential benefits, risks, and side effects of stimulants with patient to include increased heart rate, palpitations, insomnia, increased anxiety, increased irritability, or decreased appetite.  Instructed patient to contact office if experiencing any significant tolerability issues.  Answered questions she had about ketamine and [place] for treatment resistant depression for the sake of her son.  Also discussed there is a variant of [place] that is FDA approved for OCD as well.  This appt was 30 mins.  Fu 6 mos  Lynder Parents MD, DFAPA Please see After Visit Summary for patient specific instructions.  No future appointments.  No orders of the defined types were placed in this encounter.     -------------------------------

## 2019-11-22 ENCOUNTER — Other Ambulatory Visit: Payer: Self-pay | Admitting: Psychiatry

## 2019-11-26 ENCOUNTER — Other Ambulatory Visit: Payer: Self-pay | Admitting: Psychiatry

## 2019-11-26 DIAGNOSIS — F325 Major depressive disorder, single episode, in full remission: Secondary | ICD-10-CM

## 2020-01-04 ENCOUNTER — Other Ambulatory Visit: Payer: Self-pay

## 2020-01-04 ENCOUNTER — Ambulatory Visit: Payer: Medicare Other | Attending: Internal Medicine

## 2020-01-04 DIAGNOSIS — Z23 Encounter for immunization: Secondary | ICD-10-CM

## 2020-01-04 NOTE — Progress Notes (Signed)
   Covid-19 Vaccination Clinic  Name:  JOLINA SYMONDS    MRN: [id number] DOB: [birth year]  01/04/2020  Ms. Blatz was observed post Covid-19 immunization for 15 minutes without incident. She was provided with Vaccine Information Sheet and instruction to access the V-Safe system.   Ms. Kinser was instructed to call 911 with any severe reactions post vaccine: Marland Kitchen Difficulty breathing  . Swelling of face and throat  . A fast heartbeat  . A bad rash all over body  . Dizziness and weakness

## 2020-01-19 ENCOUNTER — Other Ambulatory Visit: Payer: Self-pay

## 2020-01-19 ENCOUNTER — Telehealth: Payer: Self-pay | Admitting: Psychiatry

## 2020-01-19 DIAGNOSIS — F5105 Insomnia due to other mental disorder: Secondary | ICD-10-CM

## 2020-01-19 MED ORDER — ALPRAZOLAM 1 MG PO TABS: 1.0000 mg | ORAL_TABLET | Freq: Every evening | ORAL | 1 refills | Status: DC | PRN

## 2020-01-19 NOTE — Telephone Encounter (Signed)
Patient called for refill on Xanax called to [facility] on [address], [place]. She will call back today to schedule an appointment.

## 2020-01-19 NOTE — Telephone Encounter (Signed)
Last 09/24/2019 for 90 day supply pended Dr. Clovis Pu.

## 2020-02-17 ENCOUNTER — Telehealth: Payer: Self-pay | Admitting: Psychiatry

## 2020-02-17 ENCOUNTER — Other Ambulatory Visit: Payer: Self-pay

## 2020-02-17 DIAGNOSIS — F900 Attention-deficit hyperactivity disorder, predominantly inattentive type: Secondary | ICD-10-CM

## 2020-02-17 MED ORDER — METHYLPHENIDATE HCL ER (OSM) 36 MG PO TBCR: 72.0000 mg | EXTENDED_RELEASE_TABLET | ORAL | 0 refills | Status: DC

## 2020-02-17 NOTE — Telephone Encounter (Signed)
Pt is requesting next set of RF on Concerta. Appt 12/14. [facility] on file.

## 2020-02-17 NOTE — Telephone Encounter (Signed)
Last refill 01/03/20 Pended 3 separate Rx's for Dr. Clovis Pu to review and sign Patient scheduled //

## 2020-04-06 ENCOUNTER — Ambulatory Visit (INDEPENDENT_AMBULATORY_CARE_PROVIDER_SITE_OTHER): Payer: Medicare Other | Admitting: Psychiatry

## 2020-04-06 ENCOUNTER — Encounter: Payer: Self-pay | Admitting: Psychiatry

## 2020-04-06 DIAGNOSIS — F325 Major depressive disorder, single episode, in full remission: Secondary | ICD-10-CM | POA: Diagnosis not present

## 2020-04-06 DIAGNOSIS — F900 Attention-deficit hyperactivity disorder, predominantly inattentive type: Secondary | ICD-10-CM

## 2020-04-06 DIAGNOSIS — F5105 Insomnia due to other mental disorder: Secondary | ICD-10-CM | POA: Diagnosis not present

## 2020-04-06 MED ORDER — LITHIUM CARBONATE 150 MG PO CAPS: 150.0000 mg | ORAL_CAPSULE | Freq: Every day | ORAL | 3 refills | Status: DC

## 2020-04-06 MED ORDER — ALPRAZOLAM 1 MG PO TABS: 1.0000 mg | ORAL_TABLET | Freq: Every evening | ORAL | 1 refills | Status: DC | PRN

## 2020-04-06 MED ORDER — ACETYLCYSTEINE 600 MG PO CAPS: 600.0000 mg | ORAL_CAPSULE | Freq: Every day | ORAL | 3 refills | Status: AC

## 2020-04-06 MED ORDER — FLUOXETINE HCL 20 MG PO CAPS: ORAL_CAPSULE | ORAL | 1 refills | Status: DC

## 2020-04-06 MED ORDER — METHYLPHENIDATE HCL ER (OSM) 36 MG PO TBCR: 72.0000 mg | EXTENDED_RELEASE_TABLET | ORAL | 0 refills | Status: DC

## 2020-04-06 NOTE — Addendum Note (Signed)
Addended by: Reatha Armour on: 04/06/2020 02:03 PM   Modules accepted: Level of Service

## 2020-04-06 NOTE — Progress Notes (Signed)
Jessica [name] [id number] [birth year] 69 y.o.  Subjective:   Patient ID:  Jessica [name] is a 69 y.o. (DOB 08-01-1950) female.  Chief Complaint:  Chief Complaint  Patient presents with  . Follow-up  . ADHD    HPI Jessica [name] presents to the office today for follow-up of ADD, anxeity, sleep and depression.  visit June 2020.  She was having some cognitive complaints and it was suggested that she add lithium 150 mg daily for his neuro protective effect as well as N-acetylcysteine 600 mg daily for cognitive benefit.  No other meds were changed.  She called back September 11 stating Wellbutrin was making her dizzy and shaky and she was going to reduce the dose to 150 mg and potentially stop it.  She remained on fluoxetine 20 mg, Concerta 36 mg and alprazolam 1 to 1-1/2 mg nightly as needed insomnia  December 2020 appointment with the following noted: Good overall except son still at home and causing problems. No depression relapse off Wellbutrin.  Stopped it DT tremor which resolved.  Overall satisfied with meds.  Great overall. Tolerating.   No meds were changed.  She continued Concerta 72 mg every morning, fluoxetine 20, lithium 150, alprazolam 1 mg 1 to 1-1/2 tablets nightly for sleep.  10/06/2019 appointment with the following noted:  lithium stopped DT elevated Cr and BUN. Doing OK overall. Continues other meds.  Sleep is OK with Xanax 1 mg hs usually..  No SE. Asked questions about Ketamine [facility] [place] for  depression for son.   Son has tried and failed multiple meds Plan no med changes  04/06/20 appt with following noted: Needs Xanax lately regularly for sleep.  Son is going through a lot. Doing OK with meds: Lithium 150, prozac 20, Concerta 72.  NAC.   No SE.    Sees cognitive benefit from lithium and NAC. Good health except indigestion.  Son borderline.    Patient reports stable mood and denies depressed or irritable moods.  Patient denies any recent difficulty with anxiety.  Patient  denies difficulty with sleep initiation or maintenance. Denies appetite disturbance.  Patient reports that energy and motivation have been good.  Patient denies any difficulty with concentration.  Patient denies any suicidal ideation. Needs Xanax for sleep   Usually 1mg  hs.  Disc concerns about memory in detail.  Function ok but losese things.  Satisfied with the Concerta for ADD. Duration about 12 hours.   Is forgetful but not sure why.    Past Psychiatric Medication Trials:  Abilify SE, trazodone, fluoxetine 20,  Wellbutrin 295, Concerta 72, Xanax hs, Ambien, Lexapro fatigue    Son dep and OCD.   Review of Systems:  Review of Systems  Neurological: Negative for dizziness, tremors and weakness.  Psychiatric/Behavioral: Positive for decreased concentration. Negative for agitation, behavioral problems, confusion, dysphoric mood, hallucinations, self-injury, sleep disturbance and suicidal ideas. The patient is not nervous/anxious and is not hyperactive.     Medications: I have reviewed the patient's current medications.  Current Outpatient Medications  Medication Sig Dispense Refill  . Ascorbic Acid (VITAMIN C PO) Take 1 tablet by mouth daily.    Marland Kitchen esomeprazole (NEXIUM) 40 MG capsule TAKE 1 CAPSULE BY MOUTH DAILY 90 capsule 0  . NASONEX 50 MCG/ACT nasal spray PLACE 2 SPRAYS INTO THE NOSE DAILY AS NEEDED. 17 g 5  . VALSARTAN PO Take by mouth.    . VENTOLIN HFA 108 (90 BASE) MCG/ACT inhaler INHALE 2 PUFFS BY MOUTH EVERY 6  HOURS AS NEEDED 18 g 2  . Acetylcysteine 600 MG CAPS Take 1 capsule (600 mg total) by mouth daily. 90 capsule 3  . ALPRAZolam (XANAX) 1 MG tablet Take 1-1.5 tablets (1-1.5 mg total) by mouth at bedtime as needed for anxiety. 135 tablet 1  . FLUoxetine (PROZAC) 20 MG capsule TAKE 1 CAPSULE(20 MG) BY MOUTH DAILY 90 capsule 1  . lithium carbonate 150 MG capsule Take 1 capsule (150 mg total) by mouth at bedtime. 90 capsule 3  . methylphenidate (CONCERTA) 36 MG PO CR tablet Take  2 tablets (72 mg total) by mouth every morning. 60 tablet 0  . [START ON 05/04/2020] methylphenidate (CONCERTA) 36 MG PO CR tablet Take 2 tablets (72 mg total) by mouth every morning. 60 tablet 0  . [START ON 06/01/2020] methylphenidate 36 MG PO CR tablet Take 2 tablets (72 mg total) by mouth every morning. 60 tablet 0   No current facility-administered medications for this visit.    Medication Side Effects: None  Allergies:  Allergies  Allergen Reactions  . Demerol  [Meperidine] Nausea And Vomiting  . Meperidine Hcl     nausea  . Metoprolol     Nausea and inability to increase heart rate with cardiovascular exercise  . Oxycodone-Acetaminophen     nausea  . Sulfa Antibiotics     Nausea vomiting     Past Medical History:  Diagnosis Date  . Allergic rhinitis    seasonal  . Asthma    EIB & seasonal allergen trigger  . Depression   . Hypertension   . Pain in shoulder    right side  . SOB (shortness of breath) on exertion     Family History  Problem Relation Age of Onset  . Stroke Mother 79  . Dementia Mother   . Stroke Father 48  . Heart disease Brother 10       LAD stenting  . Colon cancer Maternal Uncle   . Diabetes Son        Type 1  . Prostate cancer Maternal Uncle   . Stroke Paternal Grandmother        mid 38s  . Hypertension Paternal Grandmother     Social History   Socioeconomic History  . Marital status: Married    Spouse name: Not on file  . Number of children: Not on file  . Years of education: Not on file  . Highest education level: Not on file  Occupational History  . Not on file  Tobacco Use  . Smoking status: Former Smoker    Types: Cigarettes    Quit date: 04/24/1976    Years since quitting: 43.9  . Smokeless tobacco: Never Used  . Tobacco comment: smoked [age], up to 1ppd  Substance and Sexual Activity  . Alcohol use: Yes    Comment: daily  . Drug use: No  . Sexual activity: Not on file  Other Topics Concern  . Not on file  Social  History Narrative  . Not on file   Social Determinants of Health   Financial Resource Strain: Not on file  Food Insecurity: Not on file  Transportation Needs: Not on file  Physical Activity: Not on file  Stress: Not on file  Social Connections: Not on file  Intimate Partner Violence: Not on file    Past Medical History, Surgical history, Social history, and Family history were reviewed and updated as appropriate.   M had dementia onset 63's.  Please see review of systems for  further details on the patient's review from today.   Objective:   Physical Exam:  There were no vitals taken for this visit.  Physical Exam Constitutional:      General: She is not in acute distress.    Appearance: She is well-developed.  Musculoskeletal:        General: No deformity.  Neurological:     Mental Status: She is alert and oriented to person, place, and time.     Motor: No tremor.     Coordination: Coordination normal.     Gait: Gait normal.  Psychiatric:        Attention and Perception: She is attentive.        Mood and Affect: Mood is not anxious or depressed. Affect is not labile, blunt, angry or inappropriate.        Speech: Speech normal. Speech is not slurred.        Behavior: Behavior normal.        Thought Content: Thought content normal. Thought content does not include homicidal or suicidal ideation. Thought content does not include homicidal or suicidal plan.        Cognition and Memory: Cognition normal.        Judgment: Judgment normal.     Comments: Insight intact. No auditory or visual hallucinations. No delusions.      Lab Review:     Component Value Date/Time   NA 135 05/18/2014 1321   K 3.7 05/18/2014 1321   CL 101 05/18/2014 1321   CO2 26 05/18/2014 1321   GLUCOSE 100 (H) 05/18/2014 1321   BUN 15 05/18/2014 1321   CREATININE 0.79 05/18/2014 1321   CALCIUM 10.1 05/18/2014 1321   PROT 7.3 05/18/2014 1321   ALBUMIN 4.5 05/18/2014 1321   AST 25 05/18/2014  1321   ALT 18 05/18/2014 1321   ALKPHOS 45 05/18/2014 1321   BILITOT 0.5 05/18/2014 1321   GFRNONAA 88 (L) 10/07/2013 0840   GFRAA >90 10/07/2013 0840       Component Value Date/Time   WBC 6.1 05/18/2014 1321   RBC 3.88 05/18/2014 1321   HGB 12.3 05/18/2014 1321   HCT 36.1 05/18/2014 1321   PLT 322.0 05/18/2014 1321   MCV 93.3 05/18/2014 1321   MCH 31.8 10/07/2013 0840   MCHC 34.1 05/18/2014 1321   RDW 12.7 05/18/2014 1321   LYMPHSABS 2.4 05/18/2014 1321   MONOABS 0.4 05/18/2014 1321   EOSABS 0.1 05/18/2014 1321   BASOSABS 0.0 05/18/2014 1321    No results found for: POCLITH, LITHIUM   No results found for: PHENYTOIN, PHENOBARB, VALPROATE, CBMZ   .res Assessment: Plan:    Jowanda was seen today for follow-up and adhd.  Diagnoses and all orders for this visit:  Major depression in complete remission ([facility]) -     FLUoxetine (PROZAC) 20 MG capsule; TAKE 1 CAPSULE(20 MG) BY MOUTH DAILY -     lithium carbonate 150 MG capsule; Take 1 capsule (150 mg total) by mouth at bedtime.  Attention deficit hyperactivity disorder (ADHD), predominantly inattentive type -     Acetylcysteine 600 MG CAPS; Take 1 capsule (600 mg total) by mouth daily. -     methylphenidate (CONCERTA) 36 MG PO CR tablet; Take 2 tablets (72 mg total) by mouth every morning. -     methylphenidate (CONCERTA) 36 MG PO CR tablet; Take 2 tablets (72 mg total) by mouth every morning. -     methylphenidate 36 MG PO CR tablet; Take 2 tablets (72  mg total) by mouth every morning.  Insomnia due to mental condition -     ALPRAZolam (XANAX) 1 MG tablet; Take 1-1.5 tablets (1-1.5 mg total) by mouth at bedtime as needed for anxiety.   RO MCI  Overall good response to medication including of her memory concerns are better.  Continue NAC for mild cognitive complaints. Disc Dr. Olene Floss article on Lithium and memory DT mother's hx dementia.  No problems off the lithium DT elevation mildly in CR.  Level not in Epic. Continue NAC  (N-acetylcysteine) 600 mg capsule 1 daily for memory and concentration. Disc recent availability problems.  Disc risk LT PPI on suppressing B12 and to keep an eye on this with PCP bc of cog concerns.  We discussed the short-term risks associated with benzodiazepines including sedation and increased fall risk among others.  Discussed long-term side effect risk including dependence, potential withdrawal symptoms, and the potential eventual dose-related risk of dementia. Lowest dosage of Bz.  Consider neuropsychological testing  Still benefits from Concerta  At 72 mg daily. Discussed potential benefits, risks, and side effects of stimulants with patient to include increased heart rate, palpitations, insomnia, increased anxiety, increased irritability, or decreased appetite.  Instructed patient to contact office if experiencing any significant tolerability issues.  Answered questions she had about ketamine and [place] for treatment resistant depression for the sake of her son.  Also discussed there is a variant of [place] that is FDA approved for OCD as well.  This appt was 30 mins.  Fu 6 mos  Lynder Parents MD, DFAPA Please see After Visit Summary for patient specific instructions.  Future Appointments  Date Time Provider [place]  04/12/2020 11:30 AM Cottle, Billey Co., MD [facility] None    No orders of the defined types were placed in this encounter.     -------------------------------

## 2020-04-12 ENCOUNTER — Ambulatory Visit: Payer: Medicare Other | Admitting: Psychiatry

## 2020-05-12 DIAGNOSIS — M25512 Pain in left shoulder: Secondary | ICD-10-CM | POA: Insufficient documentation

## 2020-05-29 DIAGNOSIS — M25512 Pain in left shoulder: Secondary | ICD-10-CM | POA: Diagnosis not present

## 2020-06-01 DIAGNOSIS — M25812 Other specified joint disorders, left shoulder: Secondary | ICD-10-CM | POA: Diagnosis not present

## 2020-06-07 NOTE — Progress Notes (Addendum)
DUE TO COVID-19 ONLY ONE VISITOR IS ALLOWED TO COME WITH YOU AND STAY IN THE WAITING ROOM ONLY DURING PRE OP AND PROCEDURE DAY OF SURGERY. THE 1 VISITOR  MAY VISIT WITH YOU AFTER SURGERY IN YOUR PRIVATE ROOM DURING VISITING HOURS ONLY!  YOU NEED TO HAVE A COVID 19 TEST ON___2/20/2022 ____ @_______ , THIS TEST MUST BE DONE BEFORE SURGERY,  COVID TESTING SITE [address] [place]  [id number], IT IS ON THE RIGHT GOING OUT [address] APPROXIMATELY  2 MINUTES PAST [employer] ON THE RIGHT. ONCE YOUR COVID TEST IS COMPLETED,  PLEASE BEGIN THE QUARANTINE INSTRUCTIONS AS OUTLINED IN YOUR HANDOUT.                Jessica [name]  06/07/2020   Your procedure is scheduled on:  06/17/2020   Report to [facility] Main  Entrance   Report to admitting at     1230pm     Call this number if you have problems the morning of surgery [phone]    REMEMBER: NO  SOLID FOOD CANDY OR GUM AFTER MIDNIGHT. CLEAR LIQUIDS UNTIL      1200 noon       . NOTHING BY MOUTH EXCEPT CLEAR LIQUIDS UNTIL    . PLEASE FINISH ENSURE DRINK PER SURGEON ORDER  WHICH NEEDS TO BE COMPLETED AT 12 noon   .      CLEAR LIQUID DIET   Foods Allowed                                                                    Coffee and tea, regular and decaf                            Fruit ices (not with fruit pulp)                                      Iced Popsicles                                    Carbonated beverages, regular and diet                                    Cranberry, grape and apple juices Sports drinks like Gatorade Lightly seasoned clear broth or consume(fat free) Sugar, honey syrup ___________________________________________________________________      BRUSH YOUR TEETH MORNING OF SURGERY AND RINSE YOUR MOUTH OUT, NO CHEWING GUM CANDY OR MINTS.     Take these medicines the morning of surgery with A SIP OF WATER: Nexium, Prozac, Concerta, Inhalers as usual and bring , acetylcysteine,  methylphenidate   DO NOT TAKE ANY DIABETIC MEDICATIONS DAY OF YOUR SURGERY                               You may not have any metal on your body including hair pins and  piercings  Do not wear jewelry, make-up, lotions, powders or perfumes, deodorant             Do not wear nail polish on your fingernails.  Do not shave  48 hours prior to surgery.              Men may shave face and neck.   Do not bring valuables to the hospital. [place].  Contacts, dentures or bridgework may not be worn into surgery.  Leave suitcase in the car. After surgery it may be brought to your room.     Patients discharged the day of surgery will not be allowed to drive home. IF YOU ARE HAVING SURGERY AND GOING HOME THE SAME DAY, YOU MUST HAVE AN ADULT TO DRIVE YOU HOME AND BE WITH YOU FOR 24 HOURS. YOU MAY GO HOME BY TAXI OR UBER OR ORTHERWISE, BUT AN ADULT MUST ACCOMPANY YOU HOME AND STAY WITH YOU FOR 24 HOURS.  Name and phone number of your driver:  Special Instructions: N/A              Please read over the following fact sheets you were given: _____________________________________________________________________  [facility] - Preparing for Surgery Before surgery, you can play an important role.  Because skin is not sterile, your skin needs to be as free of germs as possible.  You can reduce the number of germs on your skin by washing with CHG (chlorahexidine gluconate) soap before surgery.  CHG is an antiseptic cleaner which kills germs and bonds with the skin to continue killing germs even after washing. Please DO NOT use if you have an allergy to CHG or antibacterial soaps.  If your skin becomes reddened/irritated stop using the CHG and inform your nurse when you arrive at Short Stay. Do not shave (including legs and underarms) for at least 48 hours prior to the first CHG shower.  You may shave your face/neck. Please follow these instructions  carefully:  1.  Shower with CHG Soap the night before surgery and the  morning of Surgery.  2.  If you choose to wash your hair, wash your hair first as usual with your  normal  shampoo.  3.  After you shampoo, rinse your hair and body thoroughly to remove the  shampoo.                           4.  Use CHG as you would any other liquid soap.  You can apply chg directly  to the skin and wash                       Gently with a scrungie or clean washcloth.  5.  Apply the CHG Soap to your body ONLY FROM THE NECK DOWN.   Do not use on face/ open                           Wound or open sores. Avoid contact with eyes, ears mouth and genitals (private parts).                       Wash face,  Genitals (private parts) with your normal soap.             6.  Wash thoroughly, paying special attention to the area where your surgery  will be performed.  7.  Thoroughly rinse your body with warm water from the neck down.  8.  DO NOT shower/wash with your normal soap after using and rinsing off  the CHG Soap.                9.  Pat yourself dry with a clean towel.            10.  Wear clean pajamas.            11.  Place clean sheets on your bed the night of your first shower and do not  sleep with pets. Day of Surgery : Do not apply any lotions/deodorants the morning of surgery.  Please wear clean clothes to the hospital/surgery center.  FAILURE TO FOLLOW THESE INSTRUCTIONS MAY RESULT IN THE CANCELLATION OF YOUR SURGERY PATIENT SIGNATURE_________________________________  NURSE SIGNATURE__________________________________  ________________________________________________________________________

## 2020-06-07 NOTE — Progress Notes (Signed)
[place]- Preparing for Total Shoulder Arthroplasty    Before surgery, you can play an important role. Because skin is not sterile, your skin needs to be as free of germs as possible. You can reduce the number of germs on your skin by using the following products. . Benzoyl Peroxide Gel o Reduces the number of germs present on the skin o Applied twice a day to shoulder area starting two days before surgery    ==================================================================  Please follow these instructions carefully:  BENZOYL PEROXIDE 5% GEL  Please do not use if you have an allergy to benzoyl peroxide.   If your skin becomes reddened/irritated stop using the benzoyl peroxide.  Starting two days before surgery, apply as follows: 1. Apply benzoyl peroxide in the morning and at night. Apply after taking a shower. If you are not taking a shower clean entire shoulder front, back, and side along with the armpit with a clean wet washcloth.  2. Place a quarter-sized dollop on your shoulder and rub in thoroughly, making sure to cover the front, back, and side of your shoulder, along with the armpit.   2 days before ____ AM   ____ PM              1 day before ____ AM   ____ PM                         3. Do this twice a day for two days.  (Last application is the night before surgery, AFTER using the CHG soap as described below).  4. Do NOT apply benzoyl peroxide gel on the day of surgery.

## 2020-06-09 ENCOUNTER — Encounter (HOSPITAL_COMMUNITY)
Admission: RE | Admit: 2020-06-09 | Discharge: 2020-06-09 | Disposition: A | Discharge status: Home / Self Care | Payer: Medicare Other | Source: Ambulatory Visit | Attending: Orthopedic Surgery | Admitting: Orthopedic Surgery

## 2020-06-09 ENCOUNTER — Encounter (HOSPITAL_COMMUNITY): Payer: Self-pay

## 2020-06-09 ENCOUNTER — Other Ambulatory Visit: Payer: Self-pay

## 2020-06-09 DIAGNOSIS — A4901 Methicillin susceptible Staphylococcus aureus infection, unspecified site: Secondary | ICD-10-CM | POA: Diagnosis not present

## 2020-06-09 DIAGNOSIS — I493 Ventricular premature depolarization: Secondary | ICD-10-CM | POA: Diagnosis not present

## 2020-06-09 DIAGNOSIS — Z01818 Encounter for other preprocedural examination: Secondary | ICD-10-CM | POA: Insufficient documentation

## 2020-06-09 HISTORY — DX: Gastro-esophageal reflux disease without esophagitis: K21.9

## 2020-06-09 HISTORY — DX: Chronic kidney disease, unspecified: N18.9

## 2020-06-09 HISTORY — DX: Unspecified osteoarthritis, unspecified site: M19.90

## 2020-06-09 HISTORY — DX: Anemia, unspecified: D64.9

## 2020-06-09 LAB — CBC
HCT: 36.2 % (ref 36.0–46.0)
Hemoglobin: 12 g/dL (ref 12.0–15.0)
MCH: 31.6 pg (ref 26.0–34.0)
MCHC: 33.1 g/dL (ref 30.0–36.0)
MCV: 95.3 fL (ref 80.0–100.0)
Platelets: 301 10*3/uL (ref 150–400)
RBC: 3.8 MIL/uL — ABNORMAL LOW (ref 3.87–5.11)
RDW: 11.4 % — ABNORMAL LOW (ref 11.5–15.5)
WBC: 7.9 10*3/uL (ref 4.0–10.5)
nRBC: 0 % (ref 0.0–0.2)

## 2020-06-09 LAB — SURGICAL PCR SCREEN
MRSA, PCR: NEGATIVE
Staphylococcus aureus: POSITIVE — AB

## 2020-06-09 LAB — BASIC METABOLIC PANEL
Anion gap: 6 (ref 5–15)
BUN: 19 mg/dL (ref 8–23)
CO2: 29 mmol/L (ref 22–32)
Calcium: 9.6 mg/dL (ref 8.9–10.3)
Chloride: 101 mmol/L (ref 98–111)
Creatinine, Ser: 0.85 mg/dL (ref 0.44–1.00)
GFR, Estimated: >60 mL/min (ref >60)
Glucose, Bld: 105 mg/dL — ABNORMAL HIGH (ref 70–99)
Potassium: 4.6 mmol/L (ref 3.5–5.1)
Sodium: 136 mmol/L (ref 135–145)

## 2020-06-09 NOTE — Progress Notes (Signed)
[place]- Preparing for Total Shoulder Arthroplasty    Before surgery, you can play an important role. Because skin is not sterile, your skin needs to be as free of germs as possible. You can reduce the number of germs on your skin by using the following products. . Benzoyl Peroxide Gel o Reduces the number of germs present on the skin o Applied twice a day to shoulder area starting two days before surgery    ==================================================================  Please follow these instructions carefully:  BENZOYL PEROXIDE 5% GEL  Please do not use if you have an allergy to benzoyl peroxide.   If your skin becomes reddened/irritated stop using the benzoyl peroxide.  Starting two days before surgery, apply as follows: 1. Apply benzoyl peroxide in the morning and at night. Apply after taking a shower. If you are not taking a shower clean entire shoulder front, back, and side along with the armpit with a clean wet washcloth.  2. Place a quarter-sized dollop on your shoulder and rub in thoroughly, making sure to cover the front, back, and side of your shoulder, along with the armpit.   2 days before ____ AM   ____ PM              1 day before ____ AM   ____ PM                         3. Do this twice a day for two days.  (Last application is the night before surgery, AFTER using the CHG soap as described below).  4. Do NOT apply benzoyl peroxide gel on the day of surgery.

## 2020-06-09 NOTE — Progress Notes (Addendum)
Anesthesia Review:  PCP: DR Marton Redwood  REquested most recent office visit note and ekg from office of Dr Brigitte Pulse [phone] [place] 07/07/2019 on chart  Cardiologist : none  Chest x-ray : EKG : 06/09/20  Echo : Stress test: Cardiac Cath :  Activity level: can do a flight of stairs without difficulty  Sleep Study/ CPAP : no  Fasting Blood Sugar :      / Checks Blood Sugar -- times a day:   Blood Thinner/ Instructions /Last Dose: ASA / Instructions/ Last Dose :  Pt is an Therapist, sports

## 2020-06-14 ENCOUNTER — Other Ambulatory Visit (HOSPITAL_COMMUNITY)
Admission: RE | Admit: 2020-06-14 | Discharge: 2020-06-14 | Disposition: A | Discharge status: Home / Self Care | Payer: Medicare Other | Source: Ambulatory Visit | Attending: Orthopedic Surgery | Admitting: Orthopedic Surgery

## 2020-06-14 DIAGNOSIS — Z01812 Encounter for preprocedural laboratory examination: Secondary | ICD-10-CM | POA: Diagnosis not present

## 2020-06-14 DIAGNOSIS — Z20822 Contact with and (suspected) exposure to covid-19: Secondary | ICD-10-CM | POA: Diagnosis not present

## 2020-06-14 LAB — SARS CORONAVIRUS 2 (TAT 6-24 HRS): SARS Coronavirus 2: NEGATIVE

## 2020-06-16 NOTE — Anesthesia Preprocedure Evaluation (Addendum)
Anesthesia Evaluation  Patient identified by MRN, date of birth, ID band Patient awake    Reviewed: Allergy & Precautions, NPO status , Patient's Chart, lab work & pertinent test results  Airway Mallampati: II  TM Distance: >3 FB Neck ROM: Full    Dental no notable dental hx. (+) Teeth Intact, Dental Advisory Given   Pulmonary neg pulmonary ROS, former smoker,    Pulmonary exam normal breath sounds clear to auscultation       Cardiovascular hypertension, Pt. on medications Normal cardiovascular exam Rhythm:Regular Rate:Normal     Neuro/Psych PSYCHIATRIC DISORDERS    GI/Hepatic Neg liver ROS, GERD  ,  Endo/Other    Renal/GU Renal disease     Musculoskeletal negative musculoskeletal ROS (+)   Abdominal   Peds  Hematology   Anesthesia Other Findings   Reproductive/Obstetrics                          Anesthesia Physical Anesthesia Plan  ASA: II  Anesthesia Plan: General   Post-op Pain Management:  Regional for Post-op pain   Induction: Intravenous  PONV Risk Score and Plan: 4 or greater and Treatment may vary due to age or medical condition and Ondansetron  Airway Management Planned: Oral ETT  Additional Equipment: None  Intra-op Plan:   Post-operative Plan: Extubation in OR  Informed Consent: I have reviewed the patients History and Physical, chart, labs and discussed the procedure including the risks, benefits and alternatives for the proposed anesthesia with the patient or authorized representative who has indicated his/her understanding and acceptance.     Dental advisory given  Plan Discussed with: CRNA and Anesthesiologist  Anesthesia Plan Comments: (GA w L ISB)     Anesthesia Quick Evaluation

## 2020-06-17 ENCOUNTER — Ambulatory Visit (HOSPITAL_COMMUNITY): Payer: Medicare Other | Admitting: Physician Assistant

## 2020-06-17 ENCOUNTER — Ambulatory Visit (HOSPITAL_COMMUNITY): Payer: Medicare Other | Admitting: Anesthesiology

## 2020-06-17 ENCOUNTER — Encounter (HOSPITAL_COMMUNITY): Payer: Self-pay | Admitting: Orthopedic Surgery

## 2020-06-17 ENCOUNTER — Encounter (HOSPITAL_COMMUNITY)
Admission: RE | Disposition: A | Discharge status: Home / Self Care | Payer: Self-pay | Source: Ambulatory Visit | Attending: Orthopedic Surgery

## 2020-06-17 ENCOUNTER — Ambulatory Visit (HOSPITAL_COMMUNITY)
Admission: RE | Admit: 2020-06-17 | Discharge: 2020-06-17 | Disposition: A | Discharge status: Home / Self Care | Payer: Medicare Other | Source: Ambulatory Visit | Attending: Orthopedic Surgery | Admitting: Orthopedic Surgery

## 2020-06-17 DIAGNOSIS — M75102 Unspecified rotator cuff tear or rupture of left shoulder, not specified as traumatic: Secondary | ICD-10-CM | POA: Diagnosis not present

## 2020-06-17 DIAGNOSIS — Z79899 Other long term (current) drug therapy: Secondary | ICD-10-CM | POA: Diagnosis not present

## 2020-06-17 DIAGNOSIS — Z87891 Personal history of nicotine dependence: Secondary | ICD-10-CM | POA: Insufficient documentation

## 2020-06-17 DIAGNOSIS — M129 Arthropathy, unspecified: Secondary | ICD-10-CM | POA: Insufficient documentation

## 2020-06-17 DIAGNOSIS — I10 Essential (primary) hypertension: Secondary | ICD-10-CM | POA: Diagnosis not present

## 2020-06-17 DIAGNOSIS — G8918 Other acute postprocedural pain: Secondary | ICD-10-CM | POA: Diagnosis not present

## 2020-06-17 DIAGNOSIS — M12812 Other specific arthropathies, not elsewhere classified, left shoulder: Secondary | ICD-10-CM | POA: Diagnosis not present

## 2020-06-17 DIAGNOSIS — M25812 Other specified joint disorders, left shoulder: Secondary | ICD-10-CM | POA: Diagnosis not present

## 2020-06-17 HISTORY — PX: REVERSE SHOULDER ARTHROPLASTY: SHX5054

## 2020-06-17 SURGERY — ARTHROPLASTY, SHOULDER, TOTAL, REVERSE
Anesthesia: General | Site: Shoulder | Laterality: Left

## 2020-06-17 MED ORDER — FENTANYL CITRATE (PF) 100 MCG/2ML IJ SOLN
50.0000 ug | Freq: Once | INTRAMUSCULAR | Status: AC
  Administered 2020-06-17: 50 ug via INTRAVENOUS
  Filled 2020-06-17: qty 2

## 2020-06-17 MED ORDER — TRAMADOL HCL 50 MG PO TABS
50.0000 mg | ORAL_TABLET | Freq: Four times a day (QID) | ORAL | 0 refills | Status: DC | PRN
Start: 2020-06-17 — End: 2020-09-02

## 2020-06-17 MED ORDER — TRANEXAMIC ACID-NACL 1000-0.7 MG/100ML-% IV SOLN
1000.0000 mg | INTRAVENOUS | Status: AC
  Administered 2020-06-17: 1000 mg via INTRAVENOUS
  Filled 2020-06-17: qty 100

## 2020-06-17 MED ORDER — BUPIVACAINE LIPOSOME 1.3 % IJ SUSP
INTRAMUSCULAR | Status: DC | PRN
  Administered 2020-06-17: 10 mL via PERINEURAL

## 2020-06-17 MED ORDER — NAPROXEN 500 MG PO TABS
500.0000 mg | ORAL_TABLET | Freq: Two times a day (BID) | ORAL | 1 refills | Status: DC
Start: 2020-06-17 — End: 2020-09-02

## 2020-06-17 MED ORDER — LIDOCAINE 2% (20 MG/ML) 5 ML SYRINGE
INTRAMUSCULAR | Status: DC | PRN
  Administered 2020-06-17: 100 mg via INTRAVENOUS

## 2020-06-17 MED ORDER — CYCLOBENZAPRINE HCL 10 MG PO TABS: 10.0000 mg | ORAL_TABLET | Freq: Three times a day (TID) | ORAL | 1 refills | Status: DC | PRN

## 2020-06-17 MED ORDER — ACETAMINOPHEN 10 MG/ML IV SOLN
1000.0000 mg | Freq: Once | INTRAVENOUS | Status: DC | PRN
Start: 2020-06-17 — End: 2020-06-17

## 2020-06-17 MED ORDER — ONDANSETRON HCL 4 MG/2ML IJ SOLN
INTRAMUSCULAR | Status: DC | PRN
  Administered 2020-06-17: 4 mg via INTRAVENOUS

## 2020-06-17 MED ORDER — VANCOMYCIN HCL 1000 MG IV SOLR
INTRAVENOUS | Status: DC | PRN
  Administered 2020-06-17: 1000 mg via TOPICAL

## 2020-06-17 MED ORDER — PROPOFOL 10 MG/ML IV BOLUS
INTRAVENOUS | Status: DC | PRN
  Administered 2020-06-17: 130 mg via INTRAVENOUS

## 2020-06-17 MED ORDER — FENTANYL CITRATE (PF) 250 MCG/5ML IJ SOLN
INTRAMUSCULAR | Status: DC | PRN
  Administered 2020-06-17: 75 ug via INTRAVENOUS
  Administered 2020-06-17: 25 ug via INTRAVENOUS

## 2020-06-17 MED ORDER — ROCURONIUM BROMIDE 10 MG/ML (PF) SYRINGE
PREFILLED_SYRINGE | INTRAVENOUS | Status: AC
  Filled 2020-06-17: qty 10

## 2020-06-17 MED ORDER — 0.9 % SODIUM CHLORIDE (POUR BTL) OPTIME
TOPICAL | Status: DC | PRN
  Administered 2020-06-17: 1000 mL

## 2020-06-17 MED ORDER — LACTATED RINGERS IV BOLUS
500.0000 mL | Freq: Once | INTRAVENOUS | Status: AC
  Administered 2020-06-17: 500 mL via INTRAVENOUS

## 2020-06-17 MED ORDER — SUGAMMADEX SODIUM 200 MG/2ML IV SOLN
INTRAVENOUS | Status: DC | PRN
  Administered 2020-06-17: 100 mg via INTRAVENOUS

## 2020-06-17 MED ORDER — FENTANYL CITRATE (PF) 100 MCG/2ML IJ SOLN
INTRAMUSCULAR | Status: AC
  Filled 2020-06-17: qty 2

## 2020-06-17 MED ORDER — ROCURONIUM BROMIDE 10 MG/ML (PF) SYRINGE
PREFILLED_SYRINGE | INTRAVENOUS | Status: DC | PRN
  Administered 2020-06-17: 40 mg via INTRAVENOUS

## 2020-06-17 MED ORDER — LACTATED RINGERS IV SOLN: INTRAVENOUS | Status: DC

## 2020-06-17 MED ORDER — ORAL CARE MOUTH RINSE: 15.0000 mL | Freq: Once | OROMUCOSAL | Status: AC

## 2020-06-17 MED ORDER — PHENYLEPHRINE HCL-NACL 10-0.9 MG/250ML-% IV SOLN
INTRAVENOUS | Status: DC | PRN
  Administered 2020-06-17: 50 ug/min via INTRAVENOUS

## 2020-06-17 MED ORDER — LACTATED RINGERS IV BOLUS
250.0000 mL | Freq: Once | INTRAVENOUS | Status: AC
  Administered 2020-06-17: 250 mL via INTRAVENOUS

## 2020-06-17 MED ORDER — ONDANSETRON HCL 4 MG/2ML IJ SOLN: 4.0000 mg | Freq: Once | INTRAMUSCULAR | Status: DC | PRN

## 2020-06-17 MED ORDER — MIDAZOLAM HCL 2 MG/2ML IJ SOLN
1.0000 mg | Freq: Once | INTRAMUSCULAR | Status: AC
  Administered 2020-06-17: 1 mg via INTRAVENOUS
  Filled 2020-06-17: qty 2

## 2020-06-17 MED ORDER — FENTANYL CITRATE (PF) 100 MCG/2ML IJ SOLN: 25.0000 ug | INTRAMUSCULAR | Status: DC | PRN

## 2020-06-17 MED ORDER — CEFAZOLIN SODIUM-DEXTROSE 2-4 GM/100ML-% IV SOLN
2.0000 g | INTRAVENOUS | Status: AC
  Administered 2020-06-17: 2 g via INTRAVENOUS
  Filled 2020-06-17: qty 100

## 2020-06-17 MED ORDER — VANCOMYCIN HCL 1000 MG IV SOLR
INTRAVENOUS | Status: AC
  Filled 2020-06-17: qty 1000

## 2020-06-17 MED ORDER — ONDANSETRON HCL 4 MG PO TABS: 4.0000 mg | ORAL_TABLET | Freq: Three times a day (TID) | ORAL | 0 refills | Status: DC | PRN

## 2020-06-17 MED ORDER — STERILE WATER FOR IRRIGATION IR SOLN
Status: DC | PRN
  Administered 2020-06-17: 2000 mL

## 2020-06-17 MED ORDER — EPHEDRINE SULFATE-NACL 50-0.9 MG/10ML-% IV SOSY
PREFILLED_SYRINGE | INTRAVENOUS | Status: DC | PRN
  Administered 2020-06-17: 10 mg via INTRAVENOUS

## 2020-06-17 MED ORDER — CHLORHEXIDINE GLUCONATE 0.12 % MT SOLN
15.0000 mL | Freq: Once | OROMUCOSAL | Status: AC
  Administered 2020-06-17: 15 mL via OROMUCOSAL

## 2020-06-17 MED ORDER — DEXAMETHASONE SODIUM PHOSPHATE 10 MG/ML IJ SOLN
INTRAMUSCULAR | Status: DC | PRN
  Administered 2020-06-17: 8 mg via INTRAVENOUS

## 2020-06-17 MED ORDER — BUPIVACAINE HCL (PF) 0.5 % IJ SOLN
INTRAMUSCULAR | Status: DC | PRN
  Administered 2020-06-17: 14 mL via PERINEURAL

## 2020-06-17 MED ORDER — HYDROMORPHONE HCL 2 MG PO TABS: 2.0000 mg | ORAL_TABLET | ORAL | 0 refills | Status: DC | PRN

## 2020-06-17 SURGICAL SUPPLY — 75 items
ADH SKN CLS APL DERMABOND .7 (GAUZE/BANDAGES/DRESSINGS) ×1
AID PSTN UNV HD RSTRNT DISP (MISCELLANEOUS) ×1
BAG SPEC THK2 15X12 ZIP CLS (MISCELLANEOUS) ×1
BAG ZIPLOCK 12X15 (MISCELLANEOUS) ×2 IMPLANT
BLADE SAW SGTL 83.5X18.5 (BLADE) ×2 IMPLANT
BSPLAT GLND +2X24 MDLR (Joint) ×1 IMPLANT
COOLER ICEMAN CLASSIC (MISCELLANEOUS) IMPLANT
COVER BACK TABLE 60X90IN (DRAPES) ×2 IMPLANT
COVER SURGICAL LIGHT HANDLE (MISCELLANEOUS) ×2 IMPLANT
COVER WAND RF STERILE (DRAPES) IMPLANT
CUP SUT UNIV REVERS 36 NEUTRAL (Cup) ×1 IMPLANT
DERMABOND ADVANCED (GAUZE/BANDAGES/DRESSINGS) ×1
DERMABOND ADVANCED .7 DNX12 (GAUZE/BANDAGES/DRESSINGS) ×1 IMPLANT
DRAPE INCISE IOBAN 66X45 STRL (DRAPES) IMPLANT
DRAPE ORTHO SPLIT 77X108 STRL (DRAPES) ×4
DRAPE SHEET LG 3/4 BI-LAMINATE (DRAPES) ×2 IMPLANT
DRAPE SURG 17X11 SM STRL (DRAPES) ×2 IMPLANT
DRAPE SURG ORHT 6 SPLT 77X108 (DRAPES) ×2 IMPLANT
DRAPE U-SHAPE 47X51 STRL (DRAPES) ×2 IMPLANT
DRSG AQUACEL AG ADV 3.5X 6 (GAUZE/BANDAGES/DRESSINGS) ×1 IMPLANT
DRSG AQUACEL AG ADV 3.5X10 (GAUZE/BANDAGES/DRESSINGS) ×2 IMPLANT
DURAPREP 26ML APPLICATOR (WOUND CARE) ×2 IMPLANT
ELECT BLADE TIP CTD 4 INCH (ELECTRODE) ×2 IMPLANT
ELECT REM PT RETURN 15FT ADLT (MISCELLANEOUS) ×2 IMPLANT
FACESHIELD WRAPAROUND (MASK) ×8 IMPLANT
FACESHIELD WRAPAROUND OR TEAM (MASK) ×4 IMPLANT
GLENOID UNI REV MOD 24 +2 LAT (Joint) ×2 IMPLANT
GLENOSPHERE 36 +4 LAT/24 (Joint) ×2 IMPLANT
GLOVE SS BIOGEL STRL SZ 7 (GLOVE) ×1 IMPLANT
GLOVE SS BIOGEL STRL SZ 7.5 (GLOVE) ×1 IMPLANT
GLOVE SUPERSENSE BIOGEL SZ 7 (GLOVE) ×1
GLOVE SUPERSENSE BIOGEL SZ 7.5 (GLOVE) ×1
GLOVE SURG ENC MOIS LTX SZ7.5 (GLOVE) ×2 IMPLANT
GLOVE SURG ENC MOIS LTX SZ8 (GLOVE) ×2 IMPLANT
GLOVE SURG SYN 7.0 (GLOVE) IMPLANT
GLOVE SURG SYN 7.0 PF PI (GLOVE) IMPLANT
GLOVE SURG SYN 7.5  E (GLOVE)
GLOVE SURG SYN 7.5 E (GLOVE) IMPLANT
GLOVE SURG SYN 7.5 PF PI (GLOVE) IMPLANT
GLOVE SURG SYN 8.0 (GLOVE) IMPLANT
GOWN STRL REUS W/TWL LRG LVL3 (GOWN DISPOSABLE) ×4 IMPLANT
KIT BASIN OR (CUSTOM PROCEDURE TRAY) ×2 IMPLANT
KIT TURNOVER KIT A (KITS) ×2 IMPLANT
LINER HUMERAL 36 +3MM SM (Shoulder) ×1 IMPLANT
MANIFOLD NEPTUNE II (INSTRUMENTS) ×2 IMPLANT
NDL TAPERED W/ NITINOL LOOP (MISCELLANEOUS) ×1 IMPLANT
NEEDLE TAPERED W/ NITINOL LOOP (MISCELLANEOUS) ×2 IMPLANT
NS IRRIG 1000ML POUR BTL (IV SOLUTION) ×2 IMPLANT
PACK SHOULDER (CUSTOM PROCEDURE TRAY) ×2 IMPLANT
PAD ARMBOARD 7.5X6 YLW CONV (MISCELLANEOUS) ×2 IMPLANT
PAD COLD SHLDR WRAP-ON (PAD) ×1 IMPLANT
PIN NITINOL TARGETER 2.8 (PIN) IMPLANT
PIN SET MODULAR GLENOID SYSTEM (PIN) ×1 IMPLANT
RESTRAINT HEAD UNIVERSAL NS (MISCELLANEOUS) ×2 IMPLANT
SCREW CENTRAL MODULAR 25 (Screw) ×1 IMPLANT
SCREW PERI LOCK 5.5X16 (Screw) ×2 IMPLANT
SCREW PERIPHERAL 5.5X20 LOCK (Screw) ×1 IMPLANT
SCREW PERIPHERAL 5.5X28 LOCK (Screw) ×1 IMPLANT
SLING ARM FOAM STRAP LRG (SOFTGOODS) IMPLANT
SLING ARM FOAM STRAP MED (SOFTGOODS) ×1 IMPLANT
SPONGE LAP 18X18 RF (DISPOSABLE) IMPLANT
STEM HUMERAL UNI REVERS SZ6 (Stem) ×1 IMPLANT
SUCTION FRAZIER HANDLE 12FR (TUBING) ×2
SUCTION TUBE FRAZIER 12FR DISP (TUBING) ×1 IMPLANT
SUT FIBERWIRE #2 38 T-5 BLUE (SUTURE)
SUT MNCRL AB 3-0 PS2 18 (SUTURE) ×2 IMPLANT
SUT MON AB 2-0 CT1 36 (SUTURE) ×2 IMPLANT
SUT VIC AB 1 CT1 36 (SUTURE) ×2 IMPLANT
SUT VIC AB 2-0 CT2 27 (SUTURE) ×1 IMPLANT
SUTURE FIBERWR #2 38 T-5 BLUE (SUTURE) IMPLANT
SUTURE TAPE 1.3 40 TPR END (SUTURE) ×2 IMPLANT
SUTURETAPE 1.3 40 TPR END (SUTURE) ×4
TOWEL OR 17X26 10 PK STRL BLUE (TOWEL DISPOSABLE) ×2 IMPLANT
TOWEL OR NON WOVEN STRL DISP B (DISPOSABLE) ×2 IMPLANT
WATER STERILE IRR 1000ML POUR (IV SOLUTION) ×4 IMPLANT

## 2020-06-17 NOTE — Evaluation (Signed)
Occupational Therapy Evaluation Patient Details Name: Jessica [name] MRN: [id number] DOB: 06-18-1950 Today's Date: 06/17/2020    History of Present Illness Patient s/p Left reverse TSA   Clinical Impression   Patient is a 70 year old woman s/p shoulder replacement without functional use of right dominant upper extremity secondary to effects of surgery and interscalene block and shoulder precautions. Therapist provided education and instruction to patient and spouse in regards to exercises, precautions, positioning, donning upper extremity clothing and bathing while maintaining shoulder precautions, ice man cooler and cuff for pain and edema management and donning/doffing sling. Patient and spouse verbalized understanding and demonstrated as needed. Patient needed assistance to donn shirt, pants, and assistance to tie shoe laces. Recommended loose clothing to ease task at home. Patient to follow up with MD for further therapy needs.      Follow Up Recommendations  Follow surgeon's recommendation for DC plan and follow-up therapies    Equipment Recommendations  None recommended by OT    Recommendations for Other Services       Precautions / Restrictions Precautions Precautions: Shoulder Shoulder Interventions: Shoulder sling/immobilizer;At all times;Off for dressing/bathing/exercises Precaution Comments: If sitting in controlled environment, ok to come out of sling to give neck a break. Please sleep in it to protect until follow up in office.     OK to use operative arm for feeding, hygiene and ADLs.   Ok to instruct Pendulums and lap slides as exercises. Ok to use operative arm within the following parameters for ADL purposes     New ROM (8/18)   Ok for PROM, AAROM, AROM within pain tolerance and within the following ROM   ER 20   ABD 45   FE 60 Required Braces or Orthoses: Sling Restrictions Weight Bearing Restrictions: Yes LUE Weight Bearing: Non weight bearing Other Position/Activity  Restrictions: No resisted internal rotation or exercises for internal rotation      Mobility Bed Mobility                    Transfers                      Balance Overall balance assessment: No apparent balance deficits (not formally assessed)                                         ADL either performed or assessed with clinical judgement   ADL Overall ADL's : Needs assistance/impaired Eating/Feeding: Modified independent               Upper Body Dressing : Moderate assistance;Sitting   Lower Body Dressing: Minimal assistance Lower Body Dressing Details (indicate cue type and reason): to pull pants up and tie shoe laces                     Vision Patient Visual Report: No change from baseline       Perception     Praxis      Pertinent Vitals/Pain Pain Assessment: No/denies pain     Hand Dominance     Extremity/Trunk Assessment Upper Extremity Assessment Upper Extremity Assessment: LUE deficits/detail LUE Deficits / Details: Able to grossly open and close fingers, rest of arm limited by continued effects of block. LUE Sensation: decreased light touch   Lower Extremity Assessment Lower Extremity Assessment: Overall WFL for tasks assessed   Cervical /  Trunk Assessment Cervical / Trunk Assessment: Normal   Communication     Cognition Arousal/Alertness: Awake/alert Behavior During Therapy: WFL for tasks assessed/performed Overall Cognitive Status: Within Functional Limits for tasks assessed                                     General Comments       Exercises     Shoulder Instructions Shoulder Instructions Donning/doffing shirt without moving shoulder: Moderate assistance;Caregiver independent with task Method for sponge bathing under operated UE: Independent Donning/doffing sling/immobilizer: Independent Correct positioning of sling/immobilizer: Independent Pendulum exercises (written home  exercise program): Independent ROM for elbow, wrist and digits of operated UE: Independent Sling wearing schedule (on at all times/off for ADL's): Independent Proper positioning of operated UE when showering: Independent Dressing change: Independent Positioning of UE while sleeping: [place] expects to be discharged to:: Private residence Living Arrangements: Spouse/significant other                                      Prior Functioning/Environment                   OT Problem List: Impaired UE functional use;Decreased range of motion;Decreased strength;Decreased knowledge of precautions      OT Treatment/Interventions:      OT Goals(Current goals can be found in the care plan section) Acute Rehab OT Goals OT Goal Formulation: All assessment and education complete, DC therapy  OT Frequency:     Barriers to D/C:            Co-evaluation              AM-PAC OT "6 Clicks" Daily Activity     Outcome Measure Help from another person eating meals?: A Little Help from another person taking care of personal grooming?: None Help from another person toileting, which includes using toliet, bedpan, or urinal?: None Help from another person bathing (including washing, rinsing, drying)?: None Help from another person to put on and taking off regular upper body clothing?: A Lot Help from another person to put on and taking off regular lower body clothing?: A Little 6 Click Score: 20   End of Session Nurse Communication:  (OT education complete)  Activity Tolerance: Patient tolerated treatment well Patient left: in chair;with family/visitor present  OT Visit Diagnosis: Muscle weakness (generalized) (M62.81)                Time: 6073-7106 OT Time Calculation (min): 30 min Charges:  OT General Charges $OT Visit: 1 Visit OT Evaluation $OT Eval Low Complexity: 1 Low OT Treatments $Self Care/Home Management : 8-22  mins  Kasy Iannacone, OTR/L [place]  Office [phone] Pager: [place] 06/17/2020, 2:44 PM

## 2020-06-17 NOTE — Op Note (Signed)
06/17/2020  12:20 PM  PATIENT:   Jessica [name]  70 y.o. female  PRE-OPERATIVE DIAGNOSIS:  left shoulder rotator cuff tear arthropathy  POST-OPERATIVE DIAGNOSIS: Same  PROCEDURE: Left shoulder reverse arthroplasty utilizing a press-fit size 6 Arthrex stem with a neutral metaphysis, +3 polyethylene insert, 36/+4 glenosphere on a small/+2 baseplate  SURGEON:  Supple, Metta Clines M.D.  ASSISTANTS: Jenetta Loges, PA-C  ANESTHESIA:   General endotracheal and interscalene block with Exparel  EBL: 200 cc  SPECIMEN: None  Drains: None   PATIENT DISPOSITION:  PACU - hemodynamically stable.    PLAN OF CARE: Discharge to home after PACU  Brief history:  Jessica [name] is a 70 year old female who has been followed for chronic and progressively increasing left shoulder pain related to rotator cuff tear arthropathy.  Due to her increasing functional rotations and failure to respond to conservative management as well as increasing pain she is brought to the operating this time for planned left shoulder reverse arthroplasty.  Preoperatively, I counseled the patient regarding treatment options and risks versus benefits thereof.  Possible surgical complications were all reviewed including potential for bleeding, infection, neurovascular injury, persistent pain, loss of motion, anesthetic complication, failure of the implant, and possible need for additional surgery. They understand and accept and agrees with our planned procedure.   Procedure detail:  After undergoing routine preop evaluation the patient received prophylactic antibiotics and interscalene block with Exparel was established in the holding area by the anesthesia department.  Patient subsequently placed supine on the operating table and underwent the smooth induction of general endotracheal anesthesia.  Placed into the beachchair position and appropriately padded and protected.  Left shoulder girdle region was sterilely prepped and draped in  standard fashion.  Timeout was called.  A deltopectoral approach left shoulder is made through an approximately 8 cm incision.  Skin flaps were elevated dissection carried deeply and the deltopectoral interval was then developed from proximal to distal with the vein taken laterally.  The upper centimeter the pectoralis major was tenotomized for exposure and the conjoined tendon was mobilized and retracted medially and adhesions beneath the deltoid were divided with electrocautery.  The long head bicep tendon was then tenodesed at the upper border of the pectoralis major tendon with proximal segment unroofed and excised.  The subscapularis was noted to have significant attenuations superiorly and the remnant was divided from the lesser tuberosity and tagged with a pair of suture tape sutures.  Capsular attachments on the anterior and infra margins of the humeral neck were then divided in a subperiosteal fashion with electrocautery and the humeral head was delivered through the wound.  Extra medullary guide was then used to outline a proposed humeral head resection which was performed with an oscillating saw at approximate 20 degrees retroversion.  A metal cap was then placed over the cut proximal humeral surface we then exposed the glenoid with appropriate retractors and performed a circumferential labral resection gaining complete visualization the periphery of the glenoid.  A guidepin was then directed into the center of the glenoid with an approximately 5 degree inferior tilt and the glenoid was then reamed with the central followed by the peripheral reamer and all debris was removed.  The glenoid preparation was completed with the central drill followed by a tap for 25 mm lag screw.  Our baseplate was then assembled and vancomycin powder applied to the threads and the baseplate was then inserted with excellent fit and fixation.  All the peripheral locking screws were  then placed using standard technique with  again vancomycin powder applied to the threads and excellent fixation was achieved.  A 36/+4 glenosphere was then impacted onto the baseplate with the central locking screw placed and excellent fixation was achieved.  We returned our attention to the proximal humerus where the canal was opened and we broached up to a size 6 achieving excellent fit and fixation.  A central metaphyseal reamer was then utilized.  Trial reduction showed excellent motion stability and soft tissue balance.  The trial was removed and the final implant was assembled.  Vancomycin powder then spread liberally into the humeral canal and the implant was then impacted with excellent fit and fixation.  Trial reduction showed excellent motion and stability and soft tissue balance with the +3 poly-.  The final +3 polywas then impacted and final reduction was performed and again the overall motion stability and soft tissue balance was much to our satisfaction.  The joint was copiously irrigated.  Hemostasis was obtained.  The subscapularis was mobilized and repaired back to the eyelets on the collar of the implant.  Balance of the vancomycin powder was then spread liberally throughout the deep tissue layers.  We did note a partial compromise of the cephalic vein and so we performed a suture ligation above and below this level with the hope for recanalization.  The deltopectoral interval was then reapproximated with a series of figure-of-eight number Vicryl sutures.  2-0 Vicryl used for the subcu layer and intracuticular 3-0 Monocryl for the skin followed by Dermabond and Aquacel dressing.  Left arm was then placed into a sling and the patient was awakened, extubated, and taken to the recovery room in stable condition.  Jenetta Loges, PA-C was utilized as an Environmental consultant throughout this case, essential for help with positioning the patient, positioning extremity, tissue manipulation, implantation of the prosthesis, suture management, wound closure,  and intraoperative decision-making.  Marin Shutter MD   Contact # [phone]

## 2020-06-17 NOTE — Anesthesia Procedure Notes (Addendum)
Anesthesia Regional Block: Interscalene brachial plexus block   Pre-Anesthetic Checklist: ,, timeout performed, Correct Patient, Correct Site, Correct Laterality, Correct Procedure, Correct Position, site marked, Risks and benefits discussed,  Surgical consent,  Pre-op evaluation,  At surgeon's request and post-op pain management  Laterality: Upper and Left  Prep: Maximum Sterile Barrier Precautions used, chloraprep       Needles:  Injection technique: Single-shot  Needle Type: Echogenic Needle     Needle Length: 5cm  Needle Gauge: 21     Additional Needles:   Procedures:,,,, ultrasound used (permanent image in chart),,,,  Narrative:  Start time: 06/17/2020 8:38 AM End time: 06/17/2020 8:46 AM Injection made incrementally with aspirations every 5 mL.  Performed by: Personally  Anesthesiologist: Barnet Glasgow, MD  Additional Notes: Block assessed prior to procedure. Patient tolerated procedure well.

## 2020-06-17 NOTE — Anesthesia Procedure Notes (Signed)
Procedure Name: Intubation Performed by: Rosaland Lao, CRNA Pre-anesthesia Checklist: Patient identified, Emergency Drugs available, Suction available and Patient being monitored Patient Re-evaluated:Patient Re-evaluated prior to induction Oxygen Delivery Method: Circle system utilized Preoxygenation: Pre-oxygenation with 100% oxygen Induction Type: IV induction Ventilation: Mask ventilation without difficulty Laryngoscope Size: Miller and 2 Grade View: Grade I Tube type: Oral Number of attempts: 1 Airway Equipment and Method: Stylet Placement Confirmation: ETT inserted through vocal cords under direct vision,  positive ETCO2 and breath sounds checked- equal and bilateral Secured at: 21 cm Tube secured with: Tape Dental Injury: Teeth and Oropharynx as per pre-operative assessment

## 2020-06-17 NOTE — Transfer of Care (Signed)
Immediate Anesthesia Transfer of Care Note  Patient: Bonnye Fava  Procedure(s) Performed: REVERSE SHOULDER ARTHROPLASTY (Left Shoulder)  Patient Location: PACU  Anesthesia Type:General  Level of Consciousness: awake, alert  and oriented  Airway & Oxygen Therapy: Patient Spontanous Breathing and Patient connected to face mask  Post-op Assessment: Report given to RN and Post -op Vital signs reviewed and stable  Post vital signs: Reviewed and stable  Last Vitals:  Vitals Value Taken Time  BP 134/79 06/17/20 1236  Temp    Pulse 72 06/17/20 1239  Resp 16 06/17/20 1239  SpO2 95 % 06/17/20 1239  Vitals shown include unvalidated device data.  Last Pain:  Vitals:   06/17/20 0845  TempSrc:   PainSc: 0-No pain      Patients Stated Pain Goal: 4 (81/82/99 3716)  Complications: No complications documented.

## 2020-06-17 NOTE — H&P (Signed)
Jessica [name]    Chief Complaint: left shoulder rotator cuff tear arthropathy HPI: The patient is a 70 y.o. female with chronic and progressively increasing left shoulder pain related to severe rotator cuff tear arthropathy.  Due to her increasing functional mutations and failure to respond to conservative management she is brought to the operating room at this time for planned left shoulder reverse arthroplasty  Past Medical History:  Diagnosis Date  . Allergic rhinitis    seasonal  . Anemia   . Arthritis   . GERD (gastroesophageal reflux disease)   . Hypertension   . Pain in shoulder    right side  . SOB (shortness of breath) on exertion     Past Surgical History:  Procedure Laterality Date  . COLONOSCOPY  2003   Dr Olevia Perches, due  01/2012  . SHOULDER ARTHROSCOPY WITH OPEN ROTATOR CUFF REPAIR AND DISTAL CLAVICLE ACROMINECTOMY Right 10/09/2013   Procedure: RIGHT SHOULDER ARTHROSCOPY, SUBACROMIAL DECOMPRESSION, DISTAL CLAVICLE RESECTION, ROTATOR CUFF REPAIR  AND POSSIBLE OPEN ROTATOR CUFF REPAIR ;  Surgeon: Marin Shutter, MD;  Location: [facility];  Service: Orthopedics;  Laterality: Right;  . TUBAL LIGATION      Family History  Problem Relation Age of Onset  . Stroke Mother 46  . Dementia Mother   . Stroke Father 27  . Heart disease Brother 52       LAD stenting  . Colon cancer Maternal Uncle   . Diabetes Son        Type 1  . Prostate cancer Maternal Uncle   . Stroke Paternal Grandmother        mid 74s  . Hypertension Paternal Grandmother     Social History:  reports that she quit smoking about 44 years ago. Her smoking use included cigarettes. She has never used smokeless tobacco. She reports current alcohol use. She reports that she does not use drugs.   Medications Prior to Admission  Medication Sig Dispense Refill  . Acetylcysteine 600 MG CAPS Take 1 capsule (600 mg total) by mouth daily. 90 capsule 3  . ALPRAZolam (XANAX) 1 MG tablet Take 1-1.5 tablets (1-1.5 mg total) by  mouth at bedtime as needed for anxiety. 135 tablet 1  . Ascorbic Acid (VITAMIN C PO) Take 1 tablet by mouth daily.    Marland Kitchen esomeprazole (NEXIUM) 40 MG capsule TAKE 1 CAPSULE BY MOUTH DAILY 90 capsule 0  . FLUoxetine (PROZAC) 20 MG capsule TAKE 1 CAPSULE(20 MG) BY MOUTH DAILY 90 capsule 1  . lithium carbonate 150 MG capsule Take 1 capsule (150 mg total) by mouth at bedtime. 90 capsule 3  . methylphenidate (CONCERTA) 36 MG PO CR tablet Take 2 tablets (72 mg total) by mouth every morning. 60 tablet 0  . methylphenidate (CONCERTA) 36 MG PO CR tablet Take 2 tablets (72 mg total) by mouth every morning. 60 tablet 0  . methylphenidate 36 MG PO CR tablet Take 2 tablets (72 mg total) by mouth every morning. 60 tablet 0  . NASONEX 50 MCG/ACT nasal spray PLACE 2 SPRAYS INTO THE NOSE DAILY AS NEEDED. 17 g 5  . olmesartan (BENICAR) 20 MG tablet Take 20 mg by mouth daily.    . VENTOLIN HFA 108 (90 BASE) MCG/ACT inhaler INHALE 2 PUFFS BY MOUTH EVERY 6 HOURS AS NEEDED 18 g 2     Physical Exam: Patient is a lean, muscular female with severely painful and guarded left shoulder motion as noted at her recent office visits.  She has  significant weakness to manual muscle testing.  Grossly neurovascular intact.  Previous plain films show the bony alignment to be maintained with no significant erosions in a neutral version.  Recent MRIs confirms a chronic and severely retracted rotator cuff tear with narrowing of the acromiohumeral interval and changes consistent with a chronic rotator cuff tear arthropathy.  Vitals  Temp:  [98.3 F (36.8 C)] 98.3 F (36.8 C) (02/24 0759) Pulse Rate:  [61-62] 61 (02/24 0845) Resp:  [11-14] 11 (02/24 0845) BP: (106-114)/(75-83) 114/83 (02/24 0845) SpO2:  [96 %-100 %] 96 % (02/24 0845) Weight:  [47.5 kg] 47.5 kg (02/24 0759)  Assessment/Plan  Impression: left shoulder rotator cuff tear arthropathy  Plan of Action: Procedure(s): REVERSE SHOULDER ARTHROPLASTY  Jessica [name] M  Jessica [name] 06/17/2020, 9:45 AM Contact # [phone]

## 2020-06-17 NOTE — Progress Notes (Signed)
AssistedDr. Valma Cava with left, ultrasound guided, interscalene  block. Side rails up, monitors on throughout procedure. See vital signs in flow sheet. Tolerated Procedure well.

## 2020-06-17 NOTE — Anesthesia Postprocedure Evaluation (Signed)
Anesthesia Post Note  Patient: Jessica [name]  Procedure(s) Performed: REVERSE SHOULDER ARTHROPLASTY (Left Shoulder)     Patient location during evaluation: PACU Anesthesia Type: General and Regional Level of consciousness: awake and alert Pain management: pain level controlled Vital Signs Assessment: post-procedure vital signs reviewed and stable Respiratory status: spontaneous breathing, nonlabored ventilation, respiratory function stable and patient connected to nasal cannula oxygen Cardiovascular status: blood pressure returned to baseline and stable Postop Assessment: no apparent nausea or vomiting Anesthetic complications: no   No complications documented.  Last Vitals:  Vitals:   06/17/20 1327 06/17/20 1400  BP: 120/84 106/66  Pulse: 61 67  Resp: 18 16  Temp: 36.4 C   SpO2: 94% 93%    Last Pain:  Vitals:   06/17/20 1400  TempSrc:   PainSc: 0-No pain                 Barnet Glasgow

## 2020-06-17 NOTE — Discharge Instructions (Signed)
 Kevin M. Supple, M.D., F.A.A.O.S. Orthopaedic Surgery Specializing in Arthroscopic and Reconstructive Surgery of the Shoulder [phone] [address] - [place], [place] [address] - Fax [phone]   POST-OP TOTAL SHOULDER REPLACEMENT INSTRUCTIONS  1. Follow up in the office for your first post-op appointment 10-14 days from the date of your surgery. If you do not already have a scheduled appointment, our office will contact you to schedule.  2. The bandage over your incision is waterproof. You may begin showering with this dressing on. You may leave this dressing on until first follow up appointment within 2 weeks. We prefer you leave this dressing in place until follow up however after 5-7 days if you are having itching or skin irritation and would like to remove it you may do so. Go slow and tug at the borders gently to break the bond the dressing has with the skin. At this point if there is no drainage it is okay to go without a bandage or you may cover it with a light guaze and tape. You can also expect significant bruising around your shoulder that will drift down your arm and into your chest wall. This is very normal and should resolve over several days.   3. Wear your sling/immobilizer at all times except to perform the exercises below or to occasionally let your arm dangle by your side to stretch your elbow. You also need to sleep in your sling immobilizer until instructed otherwise. It is ok to remove your sling if you are sitting in a controlled environment and allow your arm to rest in a position of comfort by your side or on your lap with pillows to give your neck and skin a break from the sling. You may remove it to allow arm to dangle by side to shower. If you are up walking around and when you go to sleep at night you need to wear it.  4. Range of motion to your elbow, wrist, and hand are encouraged 3-5 times daily. Exercise to your hand and fingers helps to reduce  swelling you may experience.   5. Prescriptions for a pain medication and a muscle relaxant are provided for you. It is recommended that if you are experiencing pain that you pain medication alone is not controlling, add the muscle relaxant along with the pain medication which can give additional pain relief. The first 1-2 days is generally the most severe of your pain and then should gradually decrease. As your pain lessens it is recommended that you decrease your use of the pain medications to an "as needed basis'" only and to always comply with the recommended dosages of the pain medications.  6. Pain medications can produce constipation along with their use. If you experience this, the use of an over the counter stool softener or laxative daily is recommended.   7. For additional questions or concerns, please do not hesitate to call the office. If after hours there is an answering service to forward your concerns to the physician on call.  8.Pain control following an exparel block  To help control your post-operative pain you received a nerve block  performed with Exparel which is a long acting anesthetic (numbing agent) which can provide pain relief and sensations of numbness (and relief of pain) in the operative shoulder and arm for up to 3 days. Sometimes it provides mixed relief, meaning you may still have numbness in certain areas of the arm but can still be able to   move  parts of that arm, hand, and fingers. We recommend that your prescribed pain medications  be used as needed. We do not feel it is necessary to "pre medicate" and "stay ahead" of pain.  Taking narcotic pain medications when you are not having any pain can lead to unnecessary and potentially dangerous side effects.    9. Use the ice machine as much as possible in the first 5-7 days from surgery, then you can wean its use to as needed. The ice typically needs to be replaced every 6 hours, instead of ice you can actually freeze  water bottles to put in the cooler and then fill water around them to avoid having to purchase ice. You can have spare water bottles freezing to allow you to rotate them once they have melted. Try to have a thin shirt or light cloth or towel under the ice wrap to protect your skin.   FOR ADDITIONAL INFO ON ICE MACHINE AND INSTRUCTIONS GO TO THE WEBSITE AT  https://www.djoglobal.com/products/donjoy/donjoy-iceman-[email or web address]  10.  We recommend that you avoid any dental work or cleaning in the first 3 months following your joint replacement. This is to help minimize the possibility of infection from the bacteria in your mouth that enters your bloodstream during dental work. We also recommend that you take an antibiotic prior to your dental work for the first year after your shoulder replacement to further help reduce that risk. Please simply contact our office for antibiotics to be sent to your pharmacy prior to dental work.  11. Dental Antibiotics:  In most cases prophylactic antibiotics for Dental procdeures after total joint surgery are not necessary.  Exceptions are as follows:  1. History of prior total joint infection  2. Severely immunocompromised (Organ Transplant, cancer chemotherapy, Rheumatoid biologic meds such as Humera)  3. Poorly controlled diabetes (A1C &gt; 8.0, blood glucose over 200)  If you have one of these conditions, contact your surgeon for an antibiotic prescription, prior to your dental procedure.   POST-OP EXERCISES  Pendulum Exercises  Perform pendulum exercises while standing and bending at the waist. Support your uninvolved arm on a table or chair and allow your operated arm to hang freely. Make sure to do these exercises passively - not using you shoulder muscles. These exercises can be performed once your nerve block effects have worn off.  Repeat 20 times. Do 3 sessions per day.     

## 2020-06-21 ENCOUNTER — Telehealth: Payer: Self-pay | Admitting: Psychiatry

## 2020-06-21 ENCOUNTER — Encounter (HOSPITAL_COMMUNITY): Payer: Self-pay | Admitting: Orthopedic Surgery

## 2020-06-21 ENCOUNTER — Other Ambulatory Visit: Payer: Self-pay | Admitting: Psychiatry

## 2020-06-21 DIAGNOSIS — F900 Attention-deficit hyperactivity disorder, predominantly inattentive type: Secondary | ICD-10-CM

## 2020-06-21 MED ORDER — METHYLPHENIDATE HCL ER (OSM) 36 MG PO TBCR: 72.0000 mg | EXTENDED_RELEASE_TABLET | ORAL | 0 refills | Status: DC

## 2020-06-21 NOTE — Telephone Encounter (Signed)
Cliffie call to request refill of her Concerta 36mg . Apt 10/06/20.  Send to [facility] on [address]

## 2020-06-29 DIAGNOSIS — Z471 Aftercare following joint replacement surgery: Secondary | ICD-10-CM | POA: Diagnosis not present

## 2020-06-29 DIAGNOSIS — Z96612 Presence of left artificial shoulder joint: Secondary | ICD-10-CM | POA: Diagnosis not present

## 2020-06-29 DIAGNOSIS — Z96619 Presence of unspecified artificial shoulder joint: Secondary | ICD-10-CM | POA: Diagnosis not present

## 2020-07-07 DIAGNOSIS — E785 Hyperlipidemia, unspecified: Secondary | ICD-10-CM | POA: Diagnosis not present

## 2020-07-07 DIAGNOSIS — M859 Disorder of bone density and structure, unspecified: Secondary | ICD-10-CM | POA: Diagnosis not present

## 2020-07-12 DIAGNOSIS — M25512 Pain in left shoulder: Secondary | ICD-10-CM | POA: Diagnosis not present

## 2020-07-15 DIAGNOSIS — M8580 Other specified disorders of bone density and structure, unspecified site: Secondary | ICD-10-CM | POA: Diagnosis not present

## 2020-07-15 DIAGNOSIS — I129 Hypertensive chronic kidney disease with stage 1 through stage 4 chronic kidney disease, or unspecified chronic kidney disease: Secondary | ICD-10-CM | POA: Diagnosis not present

## 2020-07-15 DIAGNOSIS — E785 Hyperlipidemia, unspecified: Secondary | ICD-10-CM | POA: Diagnosis not present

## 2020-07-15 DIAGNOSIS — D649 Anemia, unspecified: Secondary | ICD-10-CM | POA: Diagnosis not present

## 2020-07-15 DIAGNOSIS — Z811 Family history of alcohol abuse and dependence: Secondary | ICD-10-CM | POA: Diagnosis not present

## 2020-07-15 DIAGNOSIS — F418 Other specified anxiety disorders: Secondary | ICD-10-CM | POA: Diagnosis not present

## 2020-07-15 DIAGNOSIS — R82998 Other abnormal findings in urine: Secondary | ICD-10-CM | POA: Diagnosis not present

## 2020-07-15 DIAGNOSIS — Z1339 Encounter for screening examination for other mental health and behavioral disorders: Secondary | ICD-10-CM | POA: Diagnosis not present

## 2020-07-15 DIAGNOSIS — G4700 Insomnia, unspecified: Secondary | ICD-10-CM | POA: Diagnosis not present

## 2020-07-15 DIAGNOSIS — Z0000 Encounter for general adult medical examination without abnormal findings: Secondary | ICD-10-CM | POA: Diagnosis not present

## 2020-07-15 DIAGNOSIS — N1831 Chronic kidney disease, stage 3a: Secondary | ICD-10-CM | POA: Diagnosis not present

## 2020-07-15 DIAGNOSIS — Z1331 Encounter for screening for depression: Secondary | ICD-10-CM | POA: Diagnosis not present

## 2020-07-15 DIAGNOSIS — K219 Gastro-esophageal reflux disease without esophagitis: Secondary | ICD-10-CM | POA: Diagnosis not present

## 2020-07-22 DIAGNOSIS — Z96612 Presence of left artificial shoulder joint: Secondary | ICD-10-CM | POA: Insufficient documentation

## 2020-07-23 DIAGNOSIS — M25512 Pain in left shoulder: Secondary | ICD-10-CM | POA: Diagnosis not present

## 2020-07-27 DIAGNOSIS — M25512 Pain in left shoulder: Secondary | ICD-10-CM | POA: Diagnosis not present

## 2020-08-04 DIAGNOSIS — M25512 Pain in left shoulder: Secondary | ICD-10-CM | POA: Diagnosis not present

## 2020-08-11 DIAGNOSIS — M25512 Pain in left shoulder: Secondary | ICD-10-CM | POA: Diagnosis not present

## 2020-08-16 DIAGNOSIS — M25512 Pain in left shoulder: Secondary | ICD-10-CM | POA: Diagnosis not present

## 2020-08-19 DIAGNOSIS — M25512 Pain in left shoulder: Secondary | ICD-10-CM | POA: Diagnosis not present

## 2020-08-23 ENCOUNTER — Telehealth: Payer: Self-pay | Admitting: Psychiatry

## 2020-08-23 NOTE — Telephone Encounter (Signed)
Patient lm requesting a refill on the Concerta. She uses the [facility] on [address]. Patients next appt is scheduled for 6/15.

## 2020-08-23 NOTE — Telephone Encounter (Signed)
Last filled 06/22/20 but it looks like she should still have 1 more Rx left.I will let her know to check with the pharmacy

## 2020-08-24 ENCOUNTER — Other Ambulatory Visit: Payer: Self-pay | Admitting: Psychiatry

## 2020-08-24 DIAGNOSIS — F900 Attention-deficit hyperactivity disorder, predominantly inattentive type: Secondary | ICD-10-CM

## 2020-08-24 DIAGNOSIS — M25512 Pain in left shoulder: Secondary | ICD-10-CM | POA: Diagnosis not present

## 2020-08-24 MED ORDER — METHYLPHENIDATE HCL ER (OSM) 36 MG PO TBCR
72.0000 mg | EXTENDED_RELEASE_TABLET | ORAL | 0 refills | Status: DC
Start: 2020-08-24 — End: 2020-10-19

## 2020-08-24 MED ORDER — METHYLPHENIDATE HCL ER (OSM) 36 MG PO TBCR: 72.0000 mg | EXTENDED_RELEASE_TABLET | ORAL | 0 refills | Status: DC

## 2020-08-24 NOTE — Telephone Encounter (Signed)
FYI, It did not look like there were any refills left as best I could determine from the med list.  I sent in 2 prescriptions to get her to her next appointment.

## 2020-08-24 NOTE — Telephone Encounter (Signed)
noted 

## 2020-08-26 DIAGNOSIS — M25512 Pain in left shoulder: Secondary | ICD-10-CM | POA: Diagnosis not present

## 2020-09-02 ENCOUNTER — Telehealth: Payer: Self-pay | Admitting: Adult Health

## 2020-09-02 ENCOUNTER — Other Ambulatory Visit (HOSPITAL_COMMUNITY): Payer: Self-pay

## 2020-09-02 ENCOUNTER — Encounter: Payer: Self-pay | Admitting: Adult Health

## 2020-09-02 ENCOUNTER — Other Ambulatory Visit: Payer: Self-pay | Admitting: Adult Health

## 2020-09-02 DIAGNOSIS — U071 COVID-19: Secondary | ICD-10-CM

## 2020-09-02 MED ORDER — HYDROCOD POLST-CPM POLST ER 10-8 MG/5ML PO SUER
5.0000 mL | Freq: Two times a day (BID) | ORAL | 0 refills | Status: DC | PRN
  Filled 2020-09-02: qty 140, 14d supply, fill #0

## 2020-09-02 MED ORDER — NIRMATRELVIR/RITONAVIR (PAXLOVID)TABLET
3.0000 | ORAL_TABLET | Freq: Two times a day (BID) | ORAL | 0 refills | Status: AC
  Filled 2020-09-02: qty 30, 5d supply, fill #0

## 2020-09-02 MED ORDER — ALBUTEROL SULFATE HFA 108 (90 BASE) MCG/ACT IN AERS
2.0000 | INHALATION_SPRAY | Freq: Four times a day (QID) | RESPIRATORY_TRACT | 2 refills | Status: DC | PRN
  Filled 2020-09-02: qty 8.5, 25d supply, fill #0

## 2020-09-02 NOTE — Progress Notes (Signed)
Outpatient Oral COVID Treatment Note  I connected with Jessica [name] on 09/02/2020/4:53 PM by telephone and verified that I am speaking with the correct person using two identifiers.  I discussed the limitations, risks, security, and privacy concerns of performing an evaluation and management service by telephone and the availability of in person appointments. I also discussed with the patient that there may be a patient responsible charge related to this service. The patient expressed understanding and agreed to proceed.  Patient location: home Provider location: [facility]  Diagnosis: COVID-19 infection  Purpose of visit: Discussion of potential use of Molnupiravir or Paxlovid, a new treatment for mild to moderate COVID-19 viral infection in non-hospitalized patients.   Subjective: Patient is a 70 y.o. female who has been diagnosed with COVID 19 viral infection.  Their symptoms began on 08/31/20 with cough/congestion/achiness.    Past Medical History:  Diagnosis Date  . Allergic rhinitis    seasonal  . Anemia   . Arthritis   . GERD (gastroesophageal reflux disease)   . Hypertension   . Pain in shoulder    right side  . SOB (shortness of breath) on exertion     Allergies  Allergen Reactions  . Demerol  [Meperidine] Nausea And Vomiting  . Metoprolol     Nausea and inability to increase heart rate with cardiovascular exercise  . Oxycodone-Acetaminophen Nausea Only  . Sulfa Antibiotics Nausea And Vomiting     Current Outpatient Medications:  .  albuterol (VENTOLIN HFA) 108 (90 Base) MCG/ACT inhaler, Inhale 2 puffs into the lungs every 6 (six) hours as needed for wheezing or shortness of breath., Disp: 8 g, Rfl: 2 .  chlorpheniramine-HYDROcodone (TUSSIONEX) 10-8 MG/5ML SUER, Take 5 mLs by mouth every 12 (twelve) hours as needed for cough. Do not take and drive or operate heavy machinery., Disp: 140 mL, Rfl: 0 .  nirmatrelvir/ritonavir EUA (PAXLOVID) TABS, Take 3 tablets by mouth 2 (two)  times daily for 5 days. Patient GFR is >60. Take nirmatrelvir (150 mg) 2 tablet(s) twice daily for 5 days and ritonavir (100 mg) one tablet twice daily for 5 days., Disp: 30 tablet, Rfl: 0 .  rosuvastatin (CRESTOR) 10 MG tablet, Take 1 tablet by mouth at bedtime., Disp: , Rfl:  .  Acetylcysteine 600 MG CAPS, Take 1 capsule (600 mg total) by mouth daily., Disp: 90 capsule, Rfl: 3 .  ALPRAZolam (XANAX) 1 MG tablet, Take 1-1.5 tablets (1-1.5 mg total) by mouth at bedtime as needed for anxiety., Disp: 135 tablet, Rfl: 1 .  Ascorbic Acid (VITAMIN C PO), Take 1 tablet by mouth daily., Disp: , Rfl:  .  ESTRACE VAGINAL 0.1 MG/GM vaginal cream, Place 1 Applicatorful vaginally as needed., Disp: , Rfl:  .  FLUoxetine (PROZAC) 20 MG capsule, TAKE 1 CAPSULE(20 MG) BY MOUTH DAILY, Disp: 90 capsule, Rfl: 1 .  [START ON 09/21/2020] methylphenidate (CONCERTA) 36 MG PO CR tablet, Take 2 tablets (72 mg total) by mouth every morning., Disp: 60 tablet, Rfl: 0 .  methylphenidate 36 MG PO CR tablet, Take 2 tablets (72 mg total) by mouth every morning., Disp: 60 tablet, Rfl: 0 .  NASONEX 50 MCG/ACT nasal spray, PLACE 2 SPRAYS INTO THE NOSE DAILY AS NEEDED., Disp: 17 g, Rfl: 5 .  olmesartan (BENICAR) 20 MG tablet, Take 20 mg by mouth daily., Disp: , Rfl:  .  VENTOLIN HFA 108 (90 BASE) MCG/ACT inhaler, INHALE 2 PUFFS BY MOUTH EVERY 6 HOURS AS NEEDED, Disp: 18 g, Rfl: 2  Objective:  Patient sounds hoarse and tired.  They are in no apparent distress.  Breathing is non labored.  Mood and behavior are normal.  Laboratory Data:  Recent Results (from the past 2160 hour(s))  Basic metabolic panel per protocol     Status: Abnormal   Collection Time: 06/09/20  1:49 PM  Result Value Ref Range   Sodium 136 135 - 145 mmol/L   Potassium 4.6 3.5 - 5.1 mmol/L   Chloride 101 98 - 111 mmol/L   CO2 29 22 - 32 mmol/L   Glucose, Bld 105 (H) 70 - 99 mg/dL    Comment: Glucose reference range applies only to samples taken after fasting for  at least 8 hours.   BUN 19 8 - 23 mg/dL   Creatinine, Ser 0.85 0.44 - 1.00 mg/dL   Calcium 9.6 8.9 - 10.3 mg/dL   GFR, Estimated >60 >60 mL/min    Comment: (NOTE) Calculated using the CKD-EPI Creatinine Equation (2021)    Anion gap 6 5 - 15    Comment: Performed at [facility], [place] [address], [place], [place] [address]  CBC per protocol     Status: Abnormal   Collection Time: 06/09/20  1:49 PM  Result Value Ref Range   WBC 7.9 4.0 - 10.5 K/uL   RBC 3.80 (L) 3.87 - 5.11 MIL/uL   Hemoglobin 12.0 12.0 - 15.0 g/dL   HCT 36.2 36.0 - 46.0 %   MCV 95.3 80.0 - 100.0 fL   MCH 31.6 26.0 - 34.0 pg   MCHC 33.1 30.0 - 36.0 g/dL   RDW 11.4 (L) 11.5 - 15.5 %   Platelets 301 150 - 400 K/uL   nRBC 0.0 0.0 - 0.2 %    Comment: Performed at [facility], [place] [address] [place], [place] [address]  Surgical pcr screen     Status: Abnormal   Collection Time: 06/09/20  1:49 PM   Specimen: Nasal Mucosa; Nasal Swab  Result Value Ref Range   MRSA, PCR NEGATIVE NEGATIVE   Staphylococcus aureus POSITIVE (A) NEGATIVE    Comment: (NOTE) The Xpert SA Assay (FDA approved for NASAL specimens in patients 75 years of age and older), is one component of a comprehensive surveillance program. It is not intended to diagnose infection nor to guide or monitor treatment. Performed at [facility], [place] [address] [place], [place] [address]   SARS CORONAVIRUS 2 (TAT 6-24 HRS) Nasopharyngeal Nasopharyngeal Swab     Status: None   Collection Time: 06/14/20  8:15 AM   Specimen: Nasopharyngeal Swab  Result Value Ref Range   SARS Coronavirus 2 NEGATIVE NEGATIVE    Comment: (NOTE) SARS-CoV-2 target nucleic acids are NOT DETECTED.  The SARS-CoV-2 RNA is generally detectable in upper and lower respiratory specimens during the acute phase of infection. Negative results do not preclude SARS-CoV-2 infection, do not rule out co-infections with other pathogens, and  should not be used as the sole basis for treatment or other patient management decisions. Negative results must be combined with clinical observations, patient history, and epidemiological information. The expected result is Negative.  Fact Sheet for Patients: SugarRoll.be  Fact Sheet for Healthcare Providers: https://www.woods-mathews.com/  This test is not yet approved or cleared by the [place] FDA and  has been authorized for detection and/or diagnosis of SARS-CoV-2 by FDA under an Emergency Use Authorization (EUA). This EUA will remain  in effect (meaning this test can be used) for the duration of the COVID-19  declaration under Se ction 564(b)(1) of the Act, 21 U.S.C. section 360bbb-3(b)(1), unless the authorization is terminated or revoked sooner.  Performed at [facility], [place] [address] [place], [place] [address]      Assessment: 70 y.o. female with mild/moderate COVID 19 viral infection diagnosed on 08/31/2020 at high risk for progression to severe COVID 19.  Plan:  This patient is a 70 y.o. female that meets the following criteria for Emergency Use Authorization of: Paxlovid 1. Age >12 yr AND > 40 kg 2. SARS-COV-2 positive test 3. Symptom onset < 5 days 4. Mild-to-moderate COVID disease with high risk for severe progression to hospitalization or death  I have spoken and communicated the following to the patient or parent/caregiver regarding: 1. Paxlovid is an unapproved drug that is authorized for use under an Emergency Use Authorization.  2. There are no adequate, approved, available products for the treatment of COVID-19 in adults who have mild-to-moderate COVID-19 and are at high risk for progressing to severe COVID-19, including hospitalization or death. 3. Other therapeutics are currently authorized. For additional information on all products authorized for treatment or prevention of COVID-19, please see  TanEmporium.pl.  4. There are benefits and risks of taking this treatment as outlined in the "Fact Sheet for Patients and Caregivers."  5. "Fact Sheet for Patients and Caregivers" was reviewed with patient. A hard copy will be provided to patient from pharmacy prior to the patient receiving treatment. 6. Patients should continue to self-isolate and use infection control measures (e.g., wear mask, isolate, social distance, avoid sharing personal items, clean and disinfect "high touch" surfaces, and frequent handwashing) according to CDC guidelines.  7. The patient or parent/caregiver has the option to accept or refuse treatment. 8. Patient medication history was reviewed for potential drug interactions:Interaction with home meds: recommended to hold crestor.   9. Patient's GFR was calculated to be greater than 60, and they were therefore prescribed Normal dose (GFR>60) - nirmatrelvir 150mg  tab (2 tablet) by mouth twice daily AND ritonavir 100mg  tab (1 tablet) by mouth twice daily   After reviewing above information with the patient, the patient agrees to receive Paxlovid.  Follow up instructions:    . Take prescription BID x 5 days as directed . Reach out to pharmacist for counseling on medication if desired . For concerns regarding further COVID symptoms please follow up with your PCP or urgent care . For urgent or life-threatening issues, seek care at your local emergency department  The patient was provided an opportunity to ask questions, and all were answered. The patient agreed with the plan and demonstrated an understanding of the instructions.   Script sent to [facility] and opted to pick up RX.  Also sent in Tussionex and Albuterol inhaler.  The patient was advised to call their PCP or seek an in-person evaluation if the symptoms worsen or if the condition  fails to improve as anticipated.   I provided 15 minutes of non face-to-face telephone visit time during this encounter, and > 50% was spent counseling as documented under my assessment & plan.  Scot Dock, NP 09/02/2020 /4:53 PM

## 2020-09-02 NOTE — Telephone Encounter (Signed)
See my chart message

## 2020-09-03 ENCOUNTER — Other Ambulatory Visit (HOSPITAL_COMMUNITY): Payer: Self-pay

## 2020-09-14 DIAGNOSIS — M25512 Pain in left shoulder: Secondary | ICD-10-CM | POA: Diagnosis not present

## 2020-09-17 DIAGNOSIS — M25512 Pain in left shoulder: Secondary | ICD-10-CM | POA: Diagnosis not present

## 2020-09-24 DIAGNOSIS — M25512 Pain in left shoulder: Secondary | ICD-10-CM | POA: Diagnosis not present

## 2020-09-28 DIAGNOSIS — M25512 Pain in left shoulder: Secondary | ICD-10-CM | POA: Diagnosis not present

## 2020-09-30 DIAGNOSIS — M25512 Pain in left shoulder: Secondary | ICD-10-CM | POA: Diagnosis not present

## 2020-10-04 DIAGNOSIS — M25512 Pain in left shoulder: Secondary | ICD-10-CM | POA: Diagnosis not present

## 2020-10-06 ENCOUNTER — Ambulatory Visit: Payer: Medicare Other | Admitting: Psychiatry

## 2020-10-06 DIAGNOSIS — Z471 Aftercare following joint replacement surgery: Secondary | ICD-10-CM | POA: Diagnosis not present

## 2020-10-06 DIAGNOSIS — M25512 Pain in left shoulder: Secondary | ICD-10-CM | POA: Diagnosis not present

## 2020-10-06 DIAGNOSIS — Z96612 Presence of left artificial shoulder joint: Secondary | ICD-10-CM | POA: Diagnosis not present

## 2020-10-12 DIAGNOSIS — M25512 Pain in left shoulder: Secondary | ICD-10-CM | POA: Diagnosis not present

## 2020-10-14 DIAGNOSIS — M25512 Pain in left shoulder: Secondary | ICD-10-CM | POA: Diagnosis not present

## 2020-10-19 ENCOUNTER — Ambulatory Visit (INDEPENDENT_AMBULATORY_CARE_PROVIDER_SITE_OTHER): Payer: Medicare Other | Admitting: Psychiatry

## 2020-10-19 ENCOUNTER — Encounter: Payer: Self-pay | Admitting: Psychiatry

## 2020-10-19 ENCOUNTER — Other Ambulatory Visit: Payer: Self-pay

## 2020-10-19 ENCOUNTER — Ambulatory Visit: Payer: Medicare Other | Admitting: Psychiatry

## 2020-10-19 VITALS — BP 130/85 | HR 74

## 2020-10-19 DIAGNOSIS — F5105 Insomnia due to other mental disorder: Secondary | ICD-10-CM

## 2020-10-19 DIAGNOSIS — M25512 Pain in left shoulder: Secondary | ICD-10-CM | POA: Diagnosis not present

## 2020-10-19 DIAGNOSIS — F325 Major depressive disorder, single episode, in full remission: Secondary | ICD-10-CM

## 2020-10-19 DIAGNOSIS — F900 Attention-deficit hyperactivity disorder, predominantly inattentive type: Secondary | ICD-10-CM | POA: Diagnosis not present

## 2020-10-19 MED ORDER — METHYLPHENIDATE HCL ER (OSM) 36 MG PO TBCR: 72.0000 mg | EXTENDED_RELEASE_TABLET | ORAL | 0 refills | Status: DC

## 2020-10-19 MED ORDER — FLUOXETINE HCL 40 MG PO CAPS: 40.0000 mg | ORAL_CAPSULE | Freq: Every day | ORAL | 1 refills | Status: DC

## 2020-10-19 NOTE — Patient Instructions (Addendum)
Dr. Sunny Schlein, [place] under [facility] Protocol. Intermittent theta burst stimulation Roland

## 2020-10-19 NOTE — Progress Notes (Signed)
Jessica [name] [id number] 12/02/1950 70 y.o.  Subjective:   Patient ID:  Jessica [name] is a 70 y.o. (DOB July 22, 1950) female.  Chief Complaint:  Chief Complaint  Patient presents with   Follow-up   Depression   ADHD    HPI Jessica [name] presents to the office today for follow-up of ADD, anxeity, sleep and depression.  visit June 2020.  She was having some cognitive complaints and it was suggested that she add lithium 150 mg daily for his neuro protective effect as well as N-acetylcysteine 600 mg daily for cognitive benefit.  No other meds were changed.  She called back September 11 stating Wellbutrin was making her dizzy and shaky and she was going to reduce the dose to 150 mg and potentially stop it.  She remained on fluoxetine 20 mg, Concerta 36 mg and alprazolam 1 to 1-1/2 mg nightly as needed insomnia  December 2020 appointment with the following noted: Good overall except son still at home and causing problems. No depression relapse off Wellbutrin.  Stopped it DT tremor which resolved.  Overall satisfied with meds.  Great overall. Tolerating.   No meds were changed.  She continued Concerta 72 mg every morning, fluoxetine 20, lithium 150, alprazolam 1 mg 1 to 1-1/2 tablets nightly for sleep.  10/06/2019 appointment with the following noted:  lithium stopped DT elevated Cr and BUN. Doing OK overall. Continues other meds.  Sleep is OK with Xanax 1 mg hs usually..  No SE. Asked questions about Ketamine [facility] [place] for  depression for son.   Son has tried and failed multiple meds Plan no med changes  04/06/20 appt with following noted: Needs Xanax lately regularly for sleep.  Son is going through a lot. Doing OK with meds: Lithium 150, prozac 20, Concerta 72.  NAC.   No SE.    Sees cognitive benefit from lithium and NAC. Good health except indigestion.  Son borderline.   Plan: No med changes  10/19/2020 appointment with the following noted: Still taking Concerta and fluoxetine 20 Just  had shoulder replacement.  Not doing much for enjoyment and son still living at times and doing nothing. Son tried Financial planner and [employer] without help. No SE Prozac.    Patient reports stable mood and denies depressed or irritable moods.  Patient denies any recent difficulty with anxiety.  Patient denies difficulty with sleep initiation or maintenance. Denies appetite disturbance.  Patient reports that energy and motivation have been good.  Patient denies any difficulty with concentration.  Patient denies any suicidal ideation. Needs Xanax for sleep   Usually 1mg  hs.  Disc concerns about memory in detail.  Function ok but losese things.  Satisfied with the Concerta for ADD. Duration about 12 hours.   Is forgetful but not sure why.    Past Psychiatric Medication Trials:  Abilify SE, trazodone, fluoxetine 20,  Wellbutrin 595, Concerta 72, Xanax hs, Ambien, Lexapro fatigue    Son dep and OCD.   Review of Systems:  Review of Systems  Neurological:  Negative for dizziness, tremors and weakness.  Psychiatric/Behavioral:  Positive for decreased concentration. Negative for agitation, behavioral problems, confusion, dysphoric mood, hallucinations, self-injury, sleep disturbance and suicidal ideas. The patient is not nervous/anxious and is not hyperactive.    Medications: I have reviewed the patient's current medications.  Current Outpatient Medications  Medication Sig Dispense Refill   Acetylcysteine 600 MG CAPS Take 1 capsule (600 mg total) by mouth daily. 90 capsule 3   ALPRAZolam (XANAX) 1  MG tablet Take 1-1.5 tablets (1-1.5 mg total) by mouth at bedtime as needed for anxiety. 135 tablet 1   Ascorbic Acid (VITAMIN C PO) Take 1 tablet by mouth daily.     ESTRACE VAGINAL 0.1 MG/GM vaginal cream Place 1 Applicatorful vaginally as needed.     FLUoxetine (PROZAC) 20 MG capsule TAKE 1 CAPSULE(20 MG) BY MOUTH DAILY 90 capsule 1   NASONEX 50 MCG/ACT nasal spray PLACE 2 SPRAYS INTO THE NOSE DAILY AS NEEDED.  17 g 5   olmesartan (BENICAR) 20 MG tablet Take 20 mg by mouth daily.     rosuvastatin (CRESTOR) 10 MG tablet Take 1 tablet by mouth at bedtime.     methylphenidate (CONCERTA) 36 MG PO CR tablet Take 2 tablets (72 mg total) by mouth every morning. (Patient not taking: Reported on 10/19/2020) 60 tablet 0   methylphenidate 36 MG PO CR tablet Take 2 tablets (72 mg total) by mouth every morning. (Patient not taking: Reported on 10/19/2020) 60 tablet 0   No current facility-administered medications for this visit.    Medication Side Effects: None  Allergies:  Allergies  Allergen Reactions   Demerol  [Meperidine] Nausea And Vomiting   Metoprolol     Nausea and inability to increase heart rate with cardiovascular exercise   Oxycodone-Acetaminophen Nausea Only   Sulfa Antibiotics Nausea And Vomiting    Past Medical History:  Diagnosis Date   Allergic rhinitis    seasonal   Anemia    Arthritis    GERD (gastroesophageal reflux disease)    Hypertension    Pain in shoulder    right side   SOB (shortness of breath) on exertion     Family History  Problem Relation Age of Onset   Stroke Mother 54   Dementia Mother    Stroke Father 72   Heart disease Brother 35       LAD stenting   Colon cancer Maternal Uncle    Diabetes Son        Type 1   Prostate cancer Maternal Uncle    Stroke Paternal Grandmother        mid 20s   Hypertension Paternal Grandmother     Social History   Socioeconomic History   Marital status: Married    Spouse name: Not on file   Number of children: Not on file   Years of education: Not on file   Highest education level: Not on file  Occupational History   Not on file  Tobacco Use   Smoking status: Former    Pack years: 0.00    Types: Cigarettes    Quit date: 04/24/1976    Years since quitting: 44.5   Smokeless tobacco: Never   Tobacco comments:    smoked [age], up to 1ppd  Vaping Use   Vaping Use: Never used  Substance and Sexual Activity    Alcohol use: Yes    Comment: wine-2-3 days per week    Drug use: No   Sexual activity: Not on file  Other Topics Concern   Not on file  Social History Narrative   Not on file   Social Determinants of Health   Financial Resource Strain: Not on file  Food Insecurity: Not on file  Transportation Needs: Not on file  Physical Activity: Not on file  Stress: Not on file  Social Connections: Not on file  Intimate Partner Violence: Not on file    Past Medical History, Surgical history, Social history, and Family history  were reviewed and updated as appropriate.   M had dementia onset 39's.  Please see review of systems for further details on the patient's review from today.   Objective:   Physical Exam:  BP 130/85   Pulse 74   Physical Exam Constitutional:      General: She is not in acute distress. Musculoskeletal:        General: No deformity.  Neurological:     Mental Status: She is alert and oriented to person, place, and time.     Coordination: Coordination normal.     Gait: Gait normal.  Psychiatric:        Attention and Perception: She is attentive.        Mood and Affect: Mood is not anxious or depressed. Affect is not labile, blunt, angry or inappropriate.        Speech: Speech normal. Speech is not slurred.        Behavior: Behavior normal.        Thought Content: Thought content normal. Thought content does not include homicidal or suicidal ideation. Thought content does not include homicidal or suicidal plan.        Cognition and Memory: Cognition normal.        Judgment: Judgment normal.     Comments: Insight intact. No auditory or visual hallucinations. No delusions.     Lab Review:     Component Value Date/Time   NA 136 06/09/2020 1349   K 4.6 06/09/2020 1349   CL 101 06/09/2020 1349   CO2 29 06/09/2020 1349   GLUCOSE 105 (H) 06/09/2020 1349   BUN 19 06/09/2020 1349   CREATININE 0.85 06/09/2020 1349   CALCIUM 9.6 06/09/2020 1349   PROT 7.3  05/18/2014 1321   ALBUMIN 4.5 05/18/2014 1321   AST 25 05/18/2014 1321   ALT 18 05/18/2014 1321   ALKPHOS 45 05/18/2014 1321   BILITOT 0.5 05/18/2014 1321   GFRNONAA >60 06/09/2020 1349   GFRAA >90 10/07/2013 0840       Component Value Date/Time   WBC 7.9 06/09/2020 1349   RBC 3.80 (L) 06/09/2020 1349   HGB 12.0 06/09/2020 1349   HCT 36.2 06/09/2020 1349   PLT 301 06/09/2020 1349   MCV 95.3 06/09/2020 1349   MCH 31.6 06/09/2020 1349   MCHC 33.1 06/09/2020 1349   RDW 11.4 (L) 06/09/2020 1349   LYMPHSABS 2.4 05/18/2014 1321   MONOABS 0.4 05/18/2014 1321   EOSABS 0.1 05/18/2014 1321   BASOSABS 0.0 05/18/2014 1321    No results found for: POCLITH, LITHIUM   No results found for: PHENYTOIN, PHENOBARB, VALPROATE, CBMZ   .res Assessment: Plan:    Jessica [name] was seen today for follow-up, depression and adhd.  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  memory concerns are better.  Continue NAC for mild cognitive complaints. Disc Dr. Olene Floss article on Lithium and memory DT mother's hx dementia.  No problems off the lithium DT elevation mildly in CR.  Level not in Epic. Continue NAC (N-acetylcysteine) 600 mg capsule 1 daily for memory and concentration. Disc recent availability problems.  Disc risk LT PPI on suppressing B12 and to keep an eye on this with PCP bc of cog concerns.  We discussed the short-term risks associated with benzodiazepines including sedation and increased fall risk  among others.  Discussed long-term side effect risk including dependence, potential withdrawal symptoms, and the potential eventual dose-related risk of dementia. Lowest dosage of Bz.  Consider neuropsychological testing  Increase fluoxetine to 40 mg daily to try to help  residual depression.  Answered questions she had about new type of [place] for treatment resistant depression for the sake of her son.  Also discussed there is a variant of [place] that is FDA approved for OCD as well.  This appt was 30 mins.  Fu 2-3 mos  Lynder Parents MD, DFAPA Please see After Visit Summary for patient specific instructions.  No future appointments.   No orders of the defined types were placed in this encounter.     -------------------------------

## 2020-10-29 ENCOUNTER — Other Ambulatory Visit: Payer: Self-pay

## 2020-10-29 ENCOUNTER — Other Ambulatory Visit (HOSPITAL_BASED_OUTPATIENT_CLINIC_OR_DEPARTMENT_OTHER): Payer: Self-pay

## 2020-10-29 ENCOUNTER — Ambulatory Visit: Payer: Medicare Other | Attending: Internal Medicine

## 2020-10-29 DIAGNOSIS — Z23 Encounter for immunization: Secondary | ICD-10-CM

## 2020-10-29 MED ORDER — PFIZER-BIONT COVID-19 VAC-TRIS 30 MCG/0.3ML IM SUSP
INTRAMUSCULAR | 0 refills | Status: DC
  Filled 2020-10-29: qty 0.3, 1d supply, fill #0

## 2020-11-04 ENCOUNTER — Other Ambulatory Visit: Payer: Self-pay

## 2020-11-04 DIAGNOSIS — F325 Major depressive disorder, single episode, in full remission: Secondary | ICD-10-CM

## 2020-11-04 MED ORDER — FLUOXETINE HCL 40 MG PO CAPS: 40.0000 mg | ORAL_CAPSULE | Freq: Every day | ORAL | 1 refills | Status: DC

## 2020-11-30 DIAGNOSIS — Z681 Body mass index (BMI) 19 or less, adult: Secondary | ICD-10-CM | POA: Diagnosis not present

## 2020-11-30 DIAGNOSIS — Z1231 Encounter for screening mammogram for malignant neoplasm of breast: Secondary | ICD-10-CM | POA: Diagnosis not present

## 2020-11-30 DIAGNOSIS — Z01419 Encounter for gynecological examination (general) (routine) without abnormal findings: Secondary | ICD-10-CM | POA: Diagnosis not present

## 2020-12-02 ENCOUNTER — Other Ambulatory Visit: Payer: Self-pay | Admitting: Psychiatry

## 2020-12-02 DIAGNOSIS — F5105 Insomnia due to other mental disorder: Secondary | ICD-10-CM

## 2020-12-03 DIAGNOSIS — D1801 Hemangioma of skin and subcutaneous tissue: Secondary | ICD-10-CM | POA: Diagnosis not present

## 2020-12-03 DIAGNOSIS — L821 Other seborrheic keratosis: Secondary | ICD-10-CM | POA: Diagnosis not present

## 2020-12-03 DIAGNOSIS — L720 Epidermal cyst: Secondary | ICD-10-CM | POA: Diagnosis not present

## 2020-12-03 DIAGNOSIS — D2262 Melanocytic nevi of left upper limb, including shoulder: Secondary | ICD-10-CM | POA: Diagnosis not present

## 2020-12-03 DIAGNOSIS — D225 Melanocytic nevi of trunk: Secondary | ICD-10-CM | POA: Diagnosis not present

## 2020-12-03 DIAGNOSIS — D2261 Melanocytic nevi of right upper limb, including shoulder: Secondary | ICD-10-CM | POA: Diagnosis not present

## 2020-12-07 ENCOUNTER — Other Ambulatory Visit: Payer: Self-pay

## 2020-12-07 ENCOUNTER — Telehealth: Payer: Self-pay | Admitting: Psychiatry

## 2020-12-07 DIAGNOSIS — F325 Major depressive disorder, single episode, in full remission: Secondary | ICD-10-CM

## 2020-12-07 MED ORDER — FLUOXETINE HCL 40 MG PO CAPS: 40.0000 mg | ORAL_CAPSULE | Freq: Every day | ORAL | 1 refills | Status: DC

## 2020-12-07 NOTE — Telephone Encounter (Signed)
This was submitted to [facility] on 11/04/20 for 90 day plus 1 additional refill. Will resubmit

## 2020-12-07 NOTE — Telephone Encounter (Signed)
[facility] call and LM that they are trying to fill Jessica [name]'s fluoxetine.  Jessica [name] this is the last time they are reaching out to get the medication filled.  Please call them at [phone] using ref # [id number].  There must be some mix up.

## 2020-12-09 DIAGNOSIS — H43813 Vitreous degeneration, bilateral: Secondary | ICD-10-CM | POA: Diagnosis not present

## 2021-01-25 NOTE — Progress Notes (Signed)
[facility] [place] Phone: [phone] Subjective:   Jessica [name], am serving as a scribe for Dr. Hulan Saas. This visit occurred during the SARS-CoV-2 public health emergency.  Safety protocols were in place, including screening questions prior to the visit, additional usage of staff PPE, and extensive cleaning of exam room while observing appropriate contact time as indicated for disinfecting solutions.   I'm seeing this patient by the request  of:  Ginger Organ., MD  CC: Leg pain  [id number]  Jessica [name] is a 70 y.o. female coming in with complaint of L hip pain. Patient was doing mini band lateral steps and felt pull in IT Band. Pain for past month that runs down length of IT band. Pain improving. Likes to play tennis and walk but unable to do so due to pain. Patient use to be a runner and has had pain like this before.      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   Procedure: REVERSE SHOULDER ARTHROPLASTY;  Surgeon: Justice Britain, MD;  [name];  Service: Orthopedics;  Laterality: Left;  121min   SHOULDER ARTHROSCOPY WITH OPEN ROTATOR CUFF REPAIR AND DISTAL CLAVICLE ACROMINECTOMY Right 10/09/2013   Procedure: RIGHT SHOULDER ARTHROSCOPY, SUBACROMIAL DECOMPRESSION, DISTAL CLAVICLE RESECTION, ROTATOR CUFF REPAIR  AND POSSIBLE OPEN ROTATOR CUFF REPAIR ;  Surgeon: Marin Shutter, MD;  Location: [facility];  Service: Orthopedics;  Laterality: Right;   TUBAL LIGATION     Social History   Socioeconomic History   Marital status: Married    Spouse name: Not on file   Number  of children: Not on file   Years of education: Not on file   Highest education level: Not on file  Occupational History   Not on file  Tobacco Use   Smoking status: Former    Types: Cigarettes    Quit date: 04/24/1976    Years since quitting: 44.7   Smokeless tobacco: Never   Tobacco comments:    smoked [age], up to 1ppd  Vaping Use   Vaping Use: Never used  Substance and Sexual Activity   Alcohol use: Yes    Comment: wine-2-3 days per week    Drug use: [name]   Sexual activity: Not on file  Other Topics Concern   Not on file  Social History Narrative   Not on file   Social Determinants of Health   Financial Resource Strain: Not on file  Food Insecurity: Not on file  Transportation Needs: Not on file  Physical Activity: Not on file  Stress: Not on file  Social Connections: Not on file   Allergies  Allergen Reactions   Demerol  [Meperidine] Nausea And Vomiting   Metoprolol     Nausea and inability to increase heart rate with cardiovascular exercise   Oxycodone-Acetaminophen Nausea Only   Sulfa Antibiotics Nausea And Vomiting   Family History  Problem Relation Age of Onset   Stroke Mother 83   Dementia Mother    Stroke Father 65  Heart disease Brother 1       LAD stenting   Colon cancer Maternal Uncle    Diabetes Son        Type 1   Prostate cancer Maternal Uncle    Stroke Paternal Grandmother        mid 67s   Hypertension Paternal Grandmother      Current Outpatient Medications (Cardiovascular):    olmesartan (BENICAR) 20 MG tablet, Take 20 mg by mouth daily.   rosuvastatin (CRESTOR) 10 MG tablet, Take 1 tablet by mouth at bedtime.  Current Outpatient Medications (Respiratory):    NASONEX 50 MCG/ACT nasal spray, PLACE 2 SPRAYS INTO THE NOSE DAILY AS NEEDED.    Current Outpatient Medications (Other):    Acetylcysteine 600 MG CAPS, Take 1 capsule (600 mg total) by mouth daily.   ALPRAZolam (XANAX) 1 MG tablet, TAKE 1 TO 1 AND 1/2 TABLETS(1 TO 1.5 MG) BY  MOUTH AT BEDTIME AS NEEDED FOR ANXIETY   Ascorbic Acid (VITAMIN C PO), Take 1 tablet by mouth daily.   COVID-19 mRNA Vac-TriS, Pfizer, (PFIZER-BIONT COVID-19 VAC-TRIS) SUSP injection, Inject into the muscle.   ESTRACE VAGINAL 0.1 MG/GM vaginal cream, Place 1 Applicatorful vaginally as needed.   FLUoxetine (PROZAC) 40 MG capsule, Take 1 capsule (40 mg total) by mouth daily.   methylphenidate (CONCERTA) 36 MG PO CR tablet, Take 2 tablets (72 mg total) by mouth every morning.   methylphenidate 36 MG PO CR tablet, Take 2 tablets (72 mg total) by mouth every morning.   Reviewed prior external information including notes and imaging from  primary care provider As well as notes that were available from care everywhere and other healthcare systems.  Past medical history, social, surgical and family history all reviewed in electronic medical record.  [name] pertanent information unless stated regarding to the chief complaint.   Review of Systems:  [name] headache, visual changes, nausea, vomiting, diarrhea, constipation, dizziness, abdominal pain, skin rash, fevers, chills, night sweats, weight loss, swollen lymph nodes, body aches, joint swelling, chest pain, shortness of breath, mood changes. POSITIVE muscle aches  Objective  Blood pressure 112/82, pulse 77, height 5' 4.5" (1.638 m), weight 101 lb (45.8 kg), SpO2 94 %.   General: [name] apparent distress alert and oriented x3 mood and affect normal, dressed appropriately.  Patient is underweight HEENT: Pupils equal, extraocular movements intact  Respiratory: Patient's speak in full sentences and does not appear short of breath  Cardiovascular: [name] lower extremity edema, non tender, [name] erythema  Gait normal with good balance and coordination.  MSK: Patient's hip exam does have good range of motion noted.  Tender to palpation over the greater trochanteric area.  Negative straight leg test.  Mild discomfort in the paraspinal musculature of the lumbar spine.    [id number]; 15 additional minutes spent for Therapeutic exercises as stated in above notes.  This included exercises focusing on stretching, strengthening, with significant focus on eccentric aspects.   Long term goals include an improvement in range of motion, strength, endurance as well as avoiding reinjury. Patient's frequency would include in 1-2 times a day, 3-5 times a week for a duration of 6-12 weeks. Low back exercises that included:  Pelvic tilt/bracing instruction to focus on control of the pelvic girdle and lower abdominal muscles  Glute strengthening exercises, focusing on proper firing of the glutes without engaging the low back muscles Proper stretching techniques for maximum relief for the hamstrings, hip flexors, low back and some rotation where tolerated  Proper  technique shown and discussed handout in great detail with ATC.  All questions were discussed and answered.   Impression and Recommendations:     The above documentation has been reviewed and is accurate and complete Lyndal Pulley, DO

## 2021-01-26 ENCOUNTER — Ambulatory Visit (INDEPENDENT_AMBULATORY_CARE_PROVIDER_SITE_OTHER): Payer: Medicare Other | Admitting: Family Medicine

## 2021-01-26 ENCOUNTER — Ambulatory Visit: Payer: Self-pay

## 2021-01-26 ENCOUNTER — Ambulatory Visit (INDEPENDENT_AMBULATORY_CARE_PROVIDER_SITE_OTHER): Payer: Medicare Other

## 2021-01-26 ENCOUNTER — Encounter: Payer: Self-pay | Admitting: Family Medicine

## 2021-01-26 ENCOUNTER — Other Ambulatory Visit: Payer: Self-pay

## 2021-01-26 VITALS — BP 112/82 | HR 77 | Ht 64.5 in | Wt 101.0 lb

## 2021-01-26 DIAGNOSIS — M7062 Trochanteric bursitis, left hip: Secondary | ICD-10-CM | POA: Diagnosis not present

## 2021-01-26 DIAGNOSIS — M545 Low back pain, unspecified: Secondary | ICD-10-CM | POA: Diagnosis not present

## 2021-01-26 DIAGNOSIS — M25552 Pain in left hip: Secondary | ICD-10-CM

## 2021-01-26 NOTE — Assessment & Plan Note (Signed)
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  Discussed with patient about icing regimen and home exercises.  Discussed avoiding certain activities.  Do believe that this is contributing to some of the discomfort as well down the leg.  Discussed differential does include the possibility of lumbar radiculopathy and we will get x-rays of the lumbar spine and hip today to further evaluate.  Patient would like to start with topical anti-inflammatories, icing regimen, and given home exercises by athletic trainer today.  Depending on how patient responds patient will follow up with me again in 4 to 6 weeks and if worsening pain consider injection and formal physical therapy.

## 2021-01-26 NOTE — Patient Instructions (Signed)
Vit D 2000IU daily  Tart cherry extract 1200mg  daily No tennis for 2 weeks Xray L hip and back See me in 4-6 weeks

## 2021-01-27 ENCOUNTER — Encounter: Payer: Self-pay | Admitting: Family Medicine

## 2021-02-03 ENCOUNTER — Encounter: Payer: Self-pay | Admitting: Family Medicine

## 2021-02-03 ENCOUNTER — Other Ambulatory Visit (HOSPITAL_BASED_OUTPATIENT_CLINIC_OR_DEPARTMENT_OTHER): Payer: Self-pay

## 2021-02-03 ENCOUNTER — Other Ambulatory Visit: Payer: Self-pay | Admitting: Psychiatry

## 2021-02-03 MED ORDER — INFLUENZA VAC A&B SA ADJ QUAD 0.5 ML IM PRSY
PREFILLED_SYRINGE | INTRAMUSCULAR | 0 refills | Status: DC
  Filled 2021-02-03: qty 0.5, 1d supply, fill #0

## 2021-02-07 ENCOUNTER — Other Ambulatory Visit (HOSPITAL_BASED_OUTPATIENT_CLINIC_OR_DEPARTMENT_OTHER): Payer: Self-pay

## 2021-02-07 ENCOUNTER — Ambulatory Visit: Payer: Medicare Other

## 2021-02-07 ENCOUNTER — Other Ambulatory Visit: Payer: Self-pay

## 2021-02-07 MED ORDER — ZOSTER VAC RECOMB ADJUVANTED 50 MCG/0.5ML IM SUSR
INTRAMUSCULAR | 0 refills | Status: AC
  Filled 2021-02-07: qty 0.5, 1d supply, fill #0

## 2021-02-11 ENCOUNTER — Telehealth: Payer: Self-pay | Admitting: Psychiatry

## 2021-02-11 ENCOUNTER — Other Ambulatory Visit: Payer: Self-pay

## 2021-02-11 DIAGNOSIS — F900 Attention-deficit hyperactivity disorder, predominantly inattentive type: Secondary | ICD-10-CM

## 2021-02-11 NOTE — Telephone Encounter (Signed)
Should she be taking lithium?Its not clear from the note.

## 2021-02-11 NOTE — Telephone Encounter (Signed)
Patient called in for refill on Methylphenidate 36mg  and lithium carbon 150mg . Appt 12/5 Ph: [phone]. Pharmacy Methylephenidate to be sent to [facility] [address] [place], [place]. Lithium to sent to [facility]

## 2021-02-14 ENCOUNTER — Encounter (HOSPITAL_BASED_OUTPATIENT_CLINIC_OR_DEPARTMENT_OTHER): Payer: Self-pay | Admitting: *Deleted

## 2021-02-14 ENCOUNTER — Other Ambulatory Visit: Payer: Self-pay | Admitting: Psychiatry

## 2021-02-14 ENCOUNTER — Emergency Department (HOSPITAL_BASED_OUTPATIENT_CLINIC_OR_DEPARTMENT_OTHER)
Admission: EM | Admit: 2021-02-14 | Discharge: 2021-02-14 | Disposition: A | Discharge status: Home / Self Care | Payer: Medicare Other | Attending: Student | Admitting: Student

## 2021-02-14 ENCOUNTER — Emergency Department (HOSPITAL_BASED_OUTPATIENT_CLINIC_OR_DEPARTMENT_OTHER): Payer: Medicare Other

## 2021-02-14 ENCOUNTER — Other Ambulatory Visit: Payer: Self-pay

## 2021-02-14 ENCOUNTER — Emergency Department (HOSPITAL_BASED_OUTPATIENT_CLINIC_OR_DEPARTMENT_OTHER): Payer: Medicare Other | Admitting: Radiology

## 2021-02-14 DIAGNOSIS — S63290D Dislocation of distal interphalangeal joint of right index finger, subsequent encounter: Secondary | ICD-10-CM | POA: Diagnosis not present

## 2021-02-14 DIAGNOSIS — Z96612 Presence of left artificial shoulder joint: Secondary | ICD-10-CM | POA: Diagnosis not present

## 2021-02-14 DIAGNOSIS — S63260A Dislocation of metacarpophalangeal joint of right index finger, initial encounter: Secondary | ICD-10-CM | POA: Diagnosis not present

## 2021-02-14 DIAGNOSIS — M79641 Pain in right hand: Secondary | ICD-10-CM | POA: Insufficient documentation

## 2021-02-14 DIAGNOSIS — Z87891 Personal history of nicotine dependence: Secondary | ICD-10-CM | POA: Insufficient documentation

## 2021-02-14 DIAGNOSIS — W108XXA Fall (on) (from) other stairs and steps, initial encounter: Secondary | ICD-10-CM | POA: Insufficient documentation

## 2021-02-14 DIAGNOSIS — I10 Essential (primary) hypertension: Secondary | ICD-10-CM | POA: Diagnosis not present

## 2021-02-14 DIAGNOSIS — F325 Major depressive disorder, single episode, in full remission: Secondary | ICD-10-CM

## 2021-02-14 DIAGNOSIS — S63290A Dislocation of distal interphalangeal joint of right index finger, initial encounter: Secondary | ICD-10-CM | POA: Diagnosis not present

## 2021-02-14 DIAGNOSIS — M25512 Pain in left shoulder: Secondary | ICD-10-CM | POA: Diagnosis not present

## 2021-02-14 DIAGNOSIS — S6991XA Unspecified injury of right wrist, hand and finger(s), initial encounter: Secondary | ICD-10-CM | POA: Diagnosis present

## 2021-02-14 DIAGNOSIS — S63259A Unspecified dislocation of unspecified finger, initial encounter: Secondary | ICD-10-CM

## 2021-02-14 MED ORDER — LIDOCAINE HCL (PF) 1 % IJ SOLN
5.0000 mL | Freq: Once | INTRAMUSCULAR | Status: AC
  Administered 2021-02-14: 5 mL via INTRADERMAL
  Filled 2021-02-14: qty 5

## 2021-02-14 MED ORDER — NAPROXEN 500 MG PO TABS: 500.0000 mg | ORAL_TABLET | Freq: Two times a day (BID) | ORAL | 0 refills | Status: DC

## 2021-02-14 MED ORDER — FENTANYL CITRATE PF 50 MCG/ML IJ SOSY
50.0000 ug | PREFILLED_SYRINGE | Freq: Once | INTRAMUSCULAR | Status: AC
Start: 2021-02-14 — End: 2021-02-14
  Administered 2021-02-14: 50 ug via INTRAVENOUS
  Filled 2021-02-14: qty 1

## 2021-02-14 MED ORDER — LITHIUM CARBONATE 150 MG PO CAPS: 150.0000 mg | ORAL_CAPSULE | Freq: Every day | ORAL | 3 refills | Status: DC

## 2021-02-14 MED ORDER — PROPOFOL 10 MG/ML IV BOLUS
INTRAVENOUS | Status: AC | PRN
  Administered 2021-02-14: 23.6 ug via INTRAVENOUS
  Administered 2021-02-14: 20 mg via INTRAVENOUS

## 2021-02-14 MED ORDER — PROPOFOL 10 MG/ML IV BOLUS
0.5000 mg/kg | Freq: Once | INTRAVENOUS | Status: DC
  Filled 2021-02-14: qty 20

## 2021-02-14 MED ORDER — PHENYLEPHRINE 40 MCG/ML (10ML) SYRINGE FOR IV PUSH (FOR BLOOD PRESSURE SUPPORT)
80.0000 ug | PREFILLED_SYRINGE | Freq: Once | INTRAVENOUS | Status: DC | PRN
  Filled 2021-02-14: qty 10

## 2021-02-14 MED ORDER — OXYCODONE HCL 5 MG PO TABS: 5.0000 mg | ORAL_TABLET | ORAL | 0 refills | Status: DC | PRN

## 2021-02-14 NOTE — ED Notes (Signed)
VS reassessed to start q 15 min till completion of sedation. At this time primary nurse Ranetta RN is resuming care.

## 2021-02-14 NOTE — ED Provider Notes (Signed)
[facility] EMERGENCY DEPT Provider Note   CSN: [id number] Arrival date & time: 02/14/21  1020     History Chief Complaint  Patient presents with   Finger Injury    Jessica [name] is a 70 y.o. female who presents the emergency department for evaluation of a fall.  Patient states that 48 hours ago she slid down the stairs and injured her right index finger.  She also endorses left shoulder pain.  Denies head strike or loss of consciousness.  Denies chest pain, shortness of breath, abdominal pain, nausea, vomiting, confusion or other systemic or traumatic complaints.  She arrives with an obvious deformity to the right index finger with swelling that extends over the dorsum of the hand.  HPI     Past Medical History:  Diagnosis Date   Allergic rhinitis    seasonal   Anemia    Arthritis    GERD (gastroesophageal reflux disease)    Hypertension    Pain in shoulder    right side   SOB (shortness of breath) on exertion     Patient Active Problem List   Diagnosis Date Noted   Greater trochanteric bursitis of left hip 01/26/2021   Attention deficit hyperactivity disorder (ADHD) 03/03/2018   Chronic insomnia 03/03/2018   Hyperlipidemia 05/19/2014   HTN (hypertension) 07/04/2012   Rotator cuff tendonitis 05/09/2012   GERD (gastroesophageal reflux disease) 12/15/2011   NEVUS 12/02/2007   Seasonal allergies 12/02/2007    Past Surgical History:  Procedure Laterality Date   COLONOSCOPY  2003   Dr Olevia Perches, due  01/2012   REVERSE SHOULDER ARTHROPLASTY Left 06/17/2020   Procedure: REVERSE SHOULDER ARTHROPLASTY;  Surgeon: Justice Britain, MD;  [name];  Service: Orthopedics;  Laterality: Left;  110min   SHOULDER ARTHROSCOPY WITH OPEN ROTATOR CUFF REPAIR AND DISTAL CLAVICLE ACROMINECTOMY Right 10/09/2013   Procedure: RIGHT SHOULDER ARTHROSCOPY, SUBACROMIAL DECOMPRESSION, DISTAL CLAVICLE RESECTION, ROTATOR CUFF REPAIR  AND POSSIBLE OPEN ROTATOR CUFF REPAIR ;  Surgeon:  Marin Shutter, MD;  Location: [facility];  Service: Orthopedics;  Laterality: Right;   TUBAL LIGATION       OB History   No obstetric history on file.     Family History  Problem Relation Age of Onset   Stroke Mother 62   Dementia Mother    Stroke Father 22   Heart disease Brother 57       LAD stenting   Colon cancer Maternal Uncle    Diabetes Son        Type 1   Prostate cancer Maternal Uncle    Stroke Paternal Grandmother        mid 84s   Hypertension Paternal Grandmother     Social History   Tobacco Use   Smoking status: Former    Types: Cigarettes    Quit date: 04/24/1976    Years since quitting: 44.8   Smokeless tobacco: Never   Tobacco comments:    smoked [age], up to 1ppd  Vaping Use   Vaping Use: Never used  Substance Use Topics   Alcohol use: Yes    Comment: wine-2-3 days per week    Drug use: No    Home Medications Prior to Admission medications   Medication Sig Start Date End Date Taking? Authorizing Provider  Acetylcysteine 600 MG CAPS Take 1 capsule (600 mg total) by mouth daily. 04/06/20   Cottle, Billey Co., MD  ALPRAZolam Duanne Moron) 1 MG tablet TAKE 1 TO 1 AND 1/2 TABLETS(1 TO 1.5  MG) BY MOUTH AT BEDTIME AS NEEDED FOR ANXIETY 12/03/20   Cottle, Billey Co., MD  Ascorbic Acid (VITAMIN C PO) Take 1 tablet by mouth daily.    [provider]  COVID-19 mRNA Vac-TriS, Pfizer, (PFIZER-BIONT COVID-19 VAC-TRIS) SUSP injection Inject into the muscle. 10/29/20   Carlyle Basques, MD  ESTRACE VAGINAL 0.1 MG/GM vaginal cream Place 1 Applicatorful vaginally as needed. 06/20/20   [provider]  FLUoxetine (PROZAC) 40 MG capsule Take 1 capsule (40 mg total) by mouth daily. 12/07/20   Cottle, Billey Co., MD  influenza vaccine adjuvanted (FLUAD) 0.5 ML injection Inject into the muscle. 02/03/21   Carlyle Basques, MD  lithium carbonate 150 MG capsule Take 1 capsule (150 mg total) by mouth at bedtime. 02/14/21   Cottle, Billey Co., MD  methylphenidate  (CONCERTA) 36 MG PO CR tablet Take 2 tablets (72 mg total) by mouth every morning. 11/16/20   Cottle, Billey Co., MD  methylphenidate 36 MG PO CR tablet Take 2 tablets (72 mg total) by mouth every morning. 10/19/20   Cottle, Billey Co., MD  NASONEX 50 MCG/ACT nasal spray PLACE 2 SPRAYS INTO THE NOSE DAILY AS NEEDED. 12/23/14   Hendricks Limes, MD  olmesartan (BENICAR) 20 MG tablet Take 20 mg by mouth daily.    [provider]  rosuvastatin (CRESTOR) 10 MG tablet Take 1 tablet by mouth at bedtime. 08/04/19   [provider]  Zoster Vaccine Adjuvanted [facility]) injection Inject into the muscle. 02/07/21       Allergies    Demerol  [meperidine], Metoprolol, Oxycodone-acetaminophen, and Sulfa antibiotics  Review of Systems   Review of Systems  Constitutional:  Negative for chills and fever.  HENT:  Negative for ear pain and sore throat.   Eyes:  Negative for pain and visual disturbance.  Respiratory:  Negative for cough and shortness of breath.   Cardiovascular:  Negative for chest pain and palpitations.  Gastrointestinal:  Negative for abdominal pain and vomiting.  Genitourinary:  Negative for dysuria and hematuria.  Musculoskeletal:  Negative for arthralgias and back pain.       Left shoulder pain, right hand pain  Skin:  Negative for color change and rash.  Neurological:  Negative for seizures and syncope.  All other systems reviewed and are negative.  Physical Exam Updated Vital Signs BP (!) 149/86 (BP Location: Left Arm)   Pulse 66   Temp 97.9 F (36.6 C) (Oral)   Resp 16   Ht 5' 4.5" (1.638 m)   Wt 47.2 kg   SpO2 99%   BMI 17.58 kg/m   Physical Exam Vitals and nursing note reviewed.  Constitutional:      General: She is not in acute distress.    Appearance: She is well-developed.  HENT:     Head: Normocephalic and atraumatic.  Eyes:     Conjunctiva/sclera: Conjunctivae normal.  Cardiovascular:     Rate and Rhythm: Normal rate and regular rhythm.      Heart sounds: No murmur heard. Pulmonary:     Effort: Pulmonary effort is normal. No respiratory distress.     Breath sounds: Normal breath sounds.  Abdominal:     Palpations: Abdomen is soft.     Tenderness: There is no abdominal tenderness.  Musculoskeletal:        General: Swelling, tenderness and deformity present.     Cervical back: Neck supple.  Skin:    General: Skin is warm and dry.  Neurological:  Mental Status: She is alert.    ED Results / Procedures / Treatments   Labs (all labs ordered are listed, but only abnormal results are displayed) Labs Reviewed - No data to display  EKG None  Radiology DG Shoulder Left  Result Date: 02/14/2021 CLINICAL DATA:  70 year old female status post fall with pain. EXAM: LEFT SHOULDER - 2+ VIEW COMPARISON:  Chest radiographs 10/26/2012. FINDINGS: Left total shoulder arthroplasty is new since 2014. Hardware appears intact and aligned. Underlying osteopenia. No acute fracture identified. Chronic degenerative changes at the right [facility] joint. Negative visible left ribs and chest. IMPRESSION: Osteopenia and left total shoulder arthroplasty. No acute fracture or dislocation identified. Electronically Signed   By: Genevie Glendoris M.D.   On: 02/14/2021 11:26   DG Finger Index Right  Result Date: 02/14/2021 CLINICAL DATA:  70 year old female status post fall with pain. EXAM: RIGHT INDEX FINGER 2+V COMPARISON:  None. FINDINGS: Dorsal and ulnar dislocation of the 2nd MCP joint. No associated fracture is identified. Second finger IP joints appear maintained. Regional soft tissue swelling. Other joint spaces and alignment appear normal. IMPRESSION: Dorsal and ulnar dislocation of the right 2nd MCP joint. No associated fracture identified. Electronically Signed   By: Genevie Camreigh M.D.   On: 02/14/2021 11:25    Procedures .Ortho Injury Treatment  Date/Time: 02/14/2021 4:18 PM Performed by: Teressa Lower, MD Authorized by: Teressa Lower, MD    Consent:    Consent obtained:  Written and verbal   Consent given by:  Patient   Risks discussed:  Fracture, nerve damage and irreducible dislocation   Alternatives discussed:  No treatment and immobilizationInjury location: finger Location details: right index finger Injury type: dislocation Pre-procedure neurovascular assessment: neurovascularly intact Pre-procedure distal perfusion: normal Pre-procedure neurological function: normal Pre-procedure range of motion: reduced Anesthesia: hematoma block  Anesthesia: Local anesthesia used: yes Local Anesthetic: lidocaine 1% without epinephrine Anesthetic total: 5 mL  Patient sedated: Yes. Refer to sedation procedure documentation for details of sedation. Manipulation performed: yes Reduction successful: yes X-ray confirmed reduction: yes Immobilization: splint and tape Splint type: static finger Splint Applied by: Ortho Tech Supplies used: aluminum splint Post-procedure neurovascular assessment: post-procedure neurovascularly intact Post-procedure distal perfusion: normal Post-procedure neurological function: normal Post-procedure range of motion: improved   .Sedation  Date/Time: 02/14/2021 4:23 PM Performed by: Teressa Lower, MD Authorized by: Teressa Lower, MD   Consent:    Consent obtained:  Verbal and written   Consent given by:  Patient   Risks discussed:  Allergic reaction, prolonged hypoxia resulting in organ damage and prolonged sedation necessitating reversal   Alternatives discussed:  Analgesia without sedation and anxiolysis Universal protocol:    Immediately prior to procedure, a time out was called: yes   Pre-sedation assessment:    Time since last food or drink:  0800   ASA classification: class 1 - normal, healthy patient     Mallampati score:  I - soft palate, uvula, fauces, pillars visible   Pre-sedation assessments completed and reviewed: airway patency, cardiovascular function, mental status,  nausea/vomiting and respiratory function   Immediate pre-procedure details:    Reviewed: vital signs     Verified: bag valve mask available, intubation equipment available and oxygen available   Procedure details (see MAR for exact dosages):    Preoxygenation:  Nasal cannula   Sedation:  Propofol   Intended level of sedation: deep   Intra-procedure monitoring:  Blood pressure monitoring, continuous capnometry, frequent LOC assessments, cardiac monitor, continuous pulse oximetry and frequent vital sign  checks   Intra-procedure events: none     Total Provider sedation time (minutes):  20 Post-procedure details:    Attendance: Constant attendance by certified staff until patient recovered     Post-sedation assessments completed and reviewed: airway patency, cardiovascular function, mental status, nausea/vomiting and respiratory function     Patient is stable for discharge or admission: yes     Procedure completion:  Tolerated well, no immediate complications   Medications Ordered in ED Medications  lidocaine (PF) (XYLOCAINE) 1 % injection 5 mL (5 mLs Intradermal Given 02/14/21 1234)    ED Course  I have reviewed the triage vital signs and the nursing notes.  Pertinent labs & imaging results that were available during my care of the patient were reviewed by me and considered in my medical decision making (see chart for details).    MDM Rules/Calculators/A&P                          Patient seen emergency department for evaluation of hand injury and shoulder pain after a fall.  X-ray of the shoulder unremarkable.  X-ray of the right hand reveals right second MCP dislocation with no fracture.  I attempted to reduce the finger with a hematoma block only but this was unsuccessful secondary to pain.  Patient required conscious sedation with propofol leading to successful reduction of the finger dislocation.  Patient then splinted in extension and will require follow-up by outpatient orthopedic  hand surgery.  Resources for the follow-up office was given to the patient and her discharge instructions.  Patient then discharged with PCP and orthopedic follow-up.  Final Clinical Impression(s) / ED Diagnoses Final diagnoses:  None    Rx / DC Orders ED Discharge Orders     None        Adaleena Mooers, MD 02/14/21 1626

## 2021-02-14 NOTE — ED Notes (Addendum)
Attempted reduction after 1st Propofol 23.6 mg with pt calling out in pain, given by Dr Matilde Sprang

## 2021-02-14 NOTE — ED Triage Notes (Signed)
Pt slid down steps night before last injuring her rt index finger. Left shoulder pain.

## 2021-02-14 NOTE — Telephone Encounter (Signed)
Lithium 150 daily sent

## 2021-02-14 NOTE — ED Notes (Signed)
RT Note: Conscious Sedation performed uneventfully.

## 2021-02-14 NOTE — ED Notes (Signed)
Pt crying in pain after finger splint applied, ed provider made aware

## 2021-02-14 NOTE — ED Notes (Signed)
Additional 80mg  Propofol given with pt relaxed enough to reduce rt index finger without s/s of discomfort. Meds were administered by Dr Matilde Sprang.

## 2021-02-16 ENCOUNTER — Ambulatory Visit: Payer: Medicare Other | Admitting: Family Medicine

## 2021-02-16 MED ORDER — METHYLPHENIDATE HCL ER (OSM) 36 MG PO TBCR
72.0000 mg | EXTENDED_RELEASE_TABLET | ORAL | 0 refills | Status: DC
Start: 2021-04-08 — End: 2021-02-25

## 2021-02-16 MED ORDER — METHYLPHENIDATE HCL ER (OSM) 36 MG PO TBCR: 72.0000 mg | EXTENDED_RELEASE_TABLET | ORAL | 0 refills | Status: DC

## 2021-02-18 DIAGNOSIS — Z96612 Presence of left artificial shoulder joint: Secondary | ICD-10-CM | POA: Diagnosis not present

## 2021-02-25 ENCOUNTER — Ambulatory Visit: Payer: Medicare Other | Admitting: Family Medicine

## 2021-02-25 ENCOUNTER — Other Ambulatory Visit: Payer: Self-pay | Admitting: Psychiatry

## 2021-02-25 DIAGNOSIS — F900 Attention-deficit hyperactivity disorder, predominantly inattentive type: Secondary | ICD-10-CM

## 2021-02-25 MED ORDER — METHYLPHENIDATE HCL ER (OSM) 36 MG PO TBCR: 72.0000 mg | EXTENDED_RELEASE_TABLET | ORAL | 0 refills | Status: DC

## 2021-02-25 NOTE — Telephone Encounter (Signed)
Patient Jessica [name] requesting a refill on th Concerta 36 mg. She stated she's tried to obtain refill for several weeks from the [facility] and they are out. She is requesting that Dr Clovis Pu use [facility]. Tearsa is requesting a call back to update her on the status. # [phone].

## 2021-02-25 NOTE — Telephone Encounter (Signed)
Paient notified that Rx sent to Dr. Clovis Pu for approval. She said she had not checked with [facility] to see if they had in stock.

## 2021-03-08 ENCOUNTER — Encounter: Payer: Self-pay | Admitting: Family Medicine

## 2021-03-08 ENCOUNTER — Other Ambulatory Visit: Payer: Self-pay

## 2021-03-08 ENCOUNTER — Ambulatory Visit: Payer: Self-pay

## 2021-03-08 ENCOUNTER — Ambulatory Visit (INDEPENDENT_AMBULATORY_CARE_PROVIDER_SITE_OTHER): Payer: Medicare Other | Admitting: Family Medicine

## 2021-03-08 VITALS — BP 122/82 | HR 63 | Ht 64.5 in | Wt 101.0 lb

## 2021-03-08 DIAGNOSIS — M25552 Pain in left hip: Secondary | ICD-10-CM

## 2021-03-08 DIAGNOSIS — M7062 Trochanteric bursitis, left hip: Secondary | ICD-10-CM

## 2021-03-08 NOTE — Progress Notes (Signed)
[place] Phone: [phone] Subjective:   Jessica [name], am serving as a scribe for Dr. Hulan Saas.  This visit occurred during the SARS-CoV-2 public health emergency.  Safety protocols were in place, including screening questions prior to the visit, additional usage of staff PPE, and extensive cleaning of exam room while observing appropriate contact time as indicated for disinfecting solutions.   I'm seeing this patient by the request  of:  Ginger Organ., MD  CC: Left hip pain follow-up  ALP:[id number]  01/26/2021 I believe the patient has a greater trochanteric bursitis of the left hip.  Discussed with patient about icing regimen and home exercises.  Discussed avoiding certain activities.  Do believe that this is contributing to some of the discomfort as well down the leg.  Discussed differential does include the possibility of lumbar radiculopathy and we will get x-rays of the lumbar spine and hip today to further evaluate.  Patient would like to start with topical anti-inflammatories, icing regimen, and given home exercises by athletic trainer today.  Depending on how patient responds patient will follow up with me again in 4 to 6 weeks and if worsening pain consider injection and formal physical therapy.  Update 05/08/2020 Jessica [name] is a 70 y.o. female coming in with complaint of L hip pain. Patient states that she is having increasing pain in lateral aspect of L hip. Pain increased on week ago after biking and then walking at the gym.  Patient states that he still seems to be localized to the lateral hip.  Waking him up at night.  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     Past Medical History:  Diagnosis Date   Allergic rhinitis    seasonal    Anemia    Arthritis    GERD (gastroesophageal reflux disease)    Hypertension    Pain in shoulder    right side   SOB (shortness of breath) on exertion    Past Surgical History:  Procedure Laterality Date   COLONOSCOPY  2003   Dr Olevia Perches, due  01/2012   REVERSE SHOULDER ARTHROPLASTY Left 06/17/2020   Procedure: REVERSE SHOULDER ARTHROPLASTY;  Surgeon: Justice Britain, MD;  [name];  Service: Orthopedics;  Laterality: Left;  18min   SHOULDER ARTHROSCOPY WITH OPEN ROTATOR CUFF REPAIR AND DISTAL CLAVICLE ACROMINECTOMY Right 10/09/2013   Procedure: RIGHT SHOULDER ARTHROSCOPY, SUBACROMIAL DECOMPRESSION, DISTAL CLAVICLE RESECTION, ROTATOR CUFF REPAIR  AND POSSIBLE OPEN ROTATOR CUFF REPAIR ;  Surgeon: Marin Shutter, MD;  Location: [facility];  Service: Orthopedics;  Laterality: Right;   TUBAL LIGATION     Social History   Socioeconomic History   Marital status: Married    Spouse name: Not on file   Number of children: Not on file   Years of education: Not on file   Highest education level: Not on file  Occupational History   Not on file  Tobacco Use   Smoking status: Former    Types: Cigarettes    Quit date: 04/24/1976    Years since quitting: 44.9   Smokeless tobacco: Never   Tobacco comments:    smoked [age], up to 1ppd  Vaping Use   Vaping Use: Never used  Substance and Sexual Activity   Alcohol use: Yes  Comment: wine-2-3 days per week    Drug use: [name]   Sexual activity: Not on file  Other Topics Concern   Not on file  Social History Narrative   Not on file   Social Determinants of Health   Financial Resource Strain: Not on file  Food Insecurity: Not on file  Transportation Needs: Not on file  Physical Activity: Not on file  Stress: Not on file  Social Connections: Not on file   Allergies  Allergen Reactions   Demerol  [Meperidine] Nausea And Vomiting   Metoprolol     Nausea and inability to increase heart rate with cardiovascular exercise    Oxycodone-Acetaminophen Nausea Only   Sulfa Antibiotics Nausea And Vomiting   Family History  Problem Relation Age of Onset   Stroke Mother 72   Dementia Mother    Stroke Father 8   Heart disease Brother 42       LAD stenting   Colon cancer Maternal Uncle    Diabetes Son        Type 1   Prostate cancer Maternal Uncle    Stroke Paternal Grandmother        mid 74s   Hypertension Paternal Grandmother      Current Outpatient Medications (Cardiovascular):    olmesartan (BENICAR) 20 MG tablet, Take 20 mg by mouth daily.   rosuvastatin (CRESTOR) 10 MG tablet, Take 1 tablet by mouth at bedtime.  Current Outpatient Medications (Respiratory):    NASONEX 50 MCG/ACT nasal spray, PLACE 2 SPRAYS INTO THE NOSE DAILY AS NEEDED.  Current Outpatient Medications (Analgesics):    naproxen (NAPROSYN) 500 MG tablet, Take 1 tablet (500 mg total) by mouth 2 (two) times daily.   oxyCODONE (ROXICODONE) 5 MG immediate release tablet, Take 1 tablet (5 mg total) by mouth every 4 (four) hours as needed for severe pain or breakthrough pain.   Current Outpatient Medications (Other):    Acetylcysteine 600 MG CAPS, Take 1 capsule (600 mg total) by mouth daily.   ALPRAZolam (XANAX) 1 MG tablet, TAKE 1 TO 1 AND 1/2 TABLETS(1 TO 1.5 MG) BY MOUTH AT BEDTIME AS NEEDED FOR ANXIETY   Ascorbic Acid (VITAMIN C PO), Take 1 tablet by mouth daily.   COVID-19 mRNA Vac-TriS, Pfizer, (PFIZER-BIONT COVID-19 VAC-TRIS) SUSP injection, Inject into the muscle.   ESTRACE VAGINAL 0.1 MG/GM vaginal cream, Place 1 Applicatorful vaginally as needed.   FLUoxetine (PROZAC) 40 MG capsule, Take 1 capsule (40 mg total) by mouth daily.   influenza vaccine adjuvanted (FLUAD) 0.5 ML injection, Inject into the muscle.   lithium carbonate 150 MG capsule, Take 1 capsule (150 mg total) by mouth at bedtime.   [START ON 03/11/2021] methylphenidate (CONCERTA) 36 MG PO CR tablet, Take 2 tablets (72 mg total) by mouth every morning.    methylphenidate (CONCERTA) 36 MG PO CR tablet, Take 2 tablets (72 mg total) by mouth every morning.   [START ON 04/08/2021] methylphenidate (CONCERTA) 36 MG PO CR tablet, Take 2 tablets (72 mg total) by mouth every morning.   Zoster Vaccine Adjuvanted [facility]) injection, Inject into the muscle.   methylphenidate 36 MG PO CR tablet, Take 2 tablets (72 mg total) by mouth every morning.   Reviewed prior external information including notes and imaging from  primary care provider As well as notes that were available from care everywhere and other healthcare systems.  Past medical history, social, surgical and family history all reviewed in electronic medical record.  [name] pertanent information unless stated regarding  to the chief complaint.   Review of Systems:  [name] headache, visual changes, nausea, vomiting, diarrhea, constipation, dizziness, abdominal pain, skin rash, fevers, chills, night sweats, weight loss, swollen lymph nodes, joint swelling, chest pain, shortness of breath, mood changes. POSITIVE muscle aches, body aches  Objective  Blood pressure 122/82, pulse 63, height 5' 4.5" (1.638 m), weight 101 lb (45.8 kg), SpO2 99 %.   General: [name] apparent distress alert and oriented x3 mood and affect normal, dressed appropriately.  HEENT: Pupils equal, extraocular movements intact  Respiratory: Patient's speak in full sentences and does not appear short of breath  Cardiovascular: [name] lower extremity edema, non tender, [name] erythema  Gait antalgic gait MSK: Left hip exam shows the patient has severe tenderness to palpation over the greater trochanteric area on the left side.  And some limited FABER test secondary to pain.  Patient does have some limited internal range of motion noted as well.  Neurovascularly intact distally.   Procedure: Real-time Ultrasound Guided Injection of left  greater trochanteric bursitis  Device: GE Logiq Q7  Ultrasound guided injection is preferred based studies that  show increased duration, increased effect, greater accuracy, decreased procedural pain, increased response rate, and decreased cost with ultrasound guided versus blind injection.  Verbal informed consent obtained.  Time-out conducted.  Noted [name] overlying erythema, induration, or other signs of local infection.  Skin prepped in a sterile fashion.  Local anesthesia: Topical Ethyl chloride.  With sterile technique and under real time ultrasound guidance:  Greater trochanteric area was visualized and patient's bursa was noted. A 22-gauge 3 inch needle was inserted and 2 cc of 0.5% Marcaine and 1 cc of Kenalog 40 mg/dL was injected. Pictures taken Completed without difficulty  Pain immediately resolved suggesting accurate placement of the medication.  Advised to call if fevers/chills, erythema, induration, drainage, or persistent bleeding.  Impression: Technically successful ultrasound guided injection.    Impression and Recommendations:     The above documentation has been reviewed and is accurate and complete Jessica Pulley, DO

## 2021-03-08 NOTE — Assessment & Plan Note (Signed)
Patient given injection and tolerated the procedure well, discussed icing regimen and home exercises.  Discussed continuing the home exercises and icing regimen.  Increase activity slowly.  If continued difficulty occurs do feel that we need to consider the possibility of advanced imaging of the lumbar spine.  Hopefully the patient will do well with the conservative therapy and follow-up in 2 months chronic problem with exacerbation.

## 2021-03-08 NOTE — Patient Instructions (Addendum)
Injection today Good to see you! Take it easy a couple of days See you again in 2 months

## 2021-03-08 NOTE — Progress Notes (Signed)
[place] [address] [place] Phone: [phone] Subjective:    This visit occurred during the SARS-CoV-2 public health emergency.  Safety protocols were in place, including screening questions prior to the visit, additional usage of staff PPE, and extensive cleaning of exam room while observing appropriate contact time as indicated for disinfecting solutions.   I'm seeing this patient by the request  of:  Ginger Organ., MD  CC:   [id number]  01/26/2021 I believe the patient has a greater trochanteric bursitis of the left hip.  Discussed with patient about icing regimen and home exercises.  Discussed avoiding certain activities.  Do believe that this is contributing to some of the discomfort as well down the leg.  Discussed differential does include the possibility of lumbar radiculopathy and we will get x-rays of the lumbar spine and hip today to further evaluate.  Patient would like to start with topical anti-inflammatories, icing regimen, and given home exercises by athletic trainer today.  Depending on how patient responds patient will follow up with me again in 4 to 6 weeks and if worsening pain consider injection and formal physical therapy.  Updated 03/08/2021 Jessica [name] is a 70 y.o. female coming in with complaint of left hip pain.  Xray IMPRESSION: Degenerative change of the hips without acute abnormality.  IMPRESSION: Degenerative change with anterolisthesis of L4 on L5.       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   Procedure: REVERSE SHOULDER ARTHROPLASTY;  Surgeon: Justice Britain, MD;  [name];  Service: Orthopedics;  Laterality: Left;  167min   SHOULDER ARTHROSCOPY WITH OPEN ROTATOR CUFF REPAIR AND DISTAL CLAVICLE ACROMINECTOMY Right 10/09/2013   Procedure: RIGHT SHOULDER ARTHROSCOPY, SUBACROMIAL DECOMPRESSION, DISTAL CLAVICLE RESECTION, ROTATOR CUFF REPAIR  AND POSSIBLE OPEN ROTATOR CUFF REPAIR ;  Surgeon: Marin Shutter, MD;  Location: [place];  Service: Orthopedics;  Laterality: Right;   TUBAL LIGATION     Social History   Socioeconomic History   Marital status: Married    Spouse name: Not on file   Number of children: Not on file   Years of education: Not on file   Highest education level: Not on file  Occupational History   Not on file  Tobacco Use   Smoking status: Former    Types: Cigarettes    Quit date: 04/24/1976    Years since quitting: 44.9   Smokeless tobacco: Never   Tobacco comments:    smoked [age], up to 1ppd  Vaping Use   Vaping Use: Never used  Substance and Sexual Activity   Alcohol use: Yes    Comment: wine-2-3 days per week    Drug use: No   Sexual activity: Not on file  Other Topics Concern   Not on file  Social History Narrative   Not on file   Social Determinants of Health   Financial Resource Strain: Not on file  Food Insecurity: Not on file  Transportation Needs: Not on file  Physical Activity: Not on file  Stress:  Not on file  Social Connections: Not on file   Allergies  Allergen Reactions   Demerol  [Meperidine] Nausea And Vomiting   Metoprolol     Nausea and inability to increase heart rate with cardiovascular exercise   Oxycodone-Acetaminophen Nausea Only   Sulfa Antibiotics Nausea And Vomiting   Family History  Problem Relation Age of Onset   Stroke Mother 26   Dementia Mother    Stroke Father 4   Heart disease Brother 35       LAD stenting   Colon cancer Maternal Uncle    Diabetes Son        Type 1   Prostate cancer Maternal Uncle    Stroke Paternal Grandmother        mid 34s    Hypertension Paternal Grandmother      Current Outpatient Medications (Cardiovascular):    olmesartan (BENICAR) 20 MG tablet, Take 20 mg by mouth daily.   rosuvastatin (CRESTOR) 10 MG tablet, Take 1 tablet by mouth at bedtime.  Current Outpatient Medications (Respiratory):    NASONEX 50 MCG/ACT nasal spray, PLACE 2 SPRAYS INTO THE NOSE DAILY AS NEEDED.  Current Outpatient Medications (Analgesics):    naproxen (NAPROSYN) 500 MG tablet, Take 1 tablet (500 mg total) by mouth 2 (two) times daily.   oxyCODONE (ROXICODONE) 5 MG immediate release tablet, Take 1 tablet (5 mg total) by mouth every 4 (four) hours as needed for severe pain or breakthrough pain.   Current Outpatient Medications (Other):    Acetylcysteine 600 MG CAPS, Take 1 capsule (600 mg total) by mouth daily.   ALPRAZolam (XANAX) 1 MG tablet, TAKE 1 TO 1 AND 1/2 TABLETS(1 TO 1.5 MG) BY MOUTH AT BEDTIME AS NEEDED FOR ANXIETY   Ascorbic Acid (VITAMIN C PO), Take 1 tablet by mouth daily.   COVID-19 mRNA Vac-TriS, Pfizer, (PFIZER-BIONT COVID-19 VAC-TRIS) SUSP injection, Inject into the muscle.   ESTRACE VAGINAL 0.1 MG/GM vaginal cream, Place 1 Applicatorful vaginally as needed.   FLUoxetine (PROZAC) 40 MG capsule, Take 1 capsule (40 mg total) by mouth daily.   influenza vaccine adjuvanted (FLUAD) 0.5 ML injection, Inject into the muscle.   lithium carbonate 150 MG capsule, Take 1 capsule (150 mg total) by mouth at bedtime.   [START ON 03/11/2021] methylphenidate (CONCERTA) 36 MG PO CR tablet, Take 2 tablets (72 mg total) by mouth every morning.   methylphenidate (CONCERTA) 36 MG PO CR tablet, Take 2 tablets (72 mg total) by mouth every morning.   [START ON 04/08/2021] methylphenidate (CONCERTA) 36 MG PO CR tablet, Take 2 tablets (72 mg total) by mouth every morning.   methylphenidate 36 MG PO CR tablet, Take 2 tablets (72 mg total) by mouth every morning.   Zoster Vaccine Adjuvanted [facility]) injection, Inject into the  muscle.   Reviewed prior external information including notes and imaging from  primary care provider As well as notes that were available from care everywhere and other healthcare systems.  Past medical history, social, surgical and family history all reviewed in electronic medical record.  No pertanent information unless stated regarding to the chief complaint.   Review of Systems:  No headache, visual changes, nausea, vomiting, diarrhea, constipation, dizziness, abdominal pain, skin rash, fevers, chills, night sweats, weight loss, swollen lymph nodes, body aches, joint swelling, chest pain, shortness of breath, mood changes. POSITIVE muscle aches  Objective  There were no vitals taken for this visit.   General: No apparent distress alert and oriented x3 mood and affect  normal, dressed appropriately.  HEENT: Pupils equal, extraocular movements intact  Respiratory: Patient's speak in full sentences and does not appear short of breath  Cardiovascular: No lower extremity edema, non tender, no erythema  Gait normal with good balance and coordination.  MSK:  Non tender with full range of motion and good stability and symmetric strength and tone of shoulders, elbows, wrist, hip, knee and ankles bilaterally.     Impression and Recommendations:     The above documentation has been reviewed and is accurate and complete Belva Agee

## 2021-03-10 ENCOUNTER — Ambulatory Visit: Payer: Medicare Other | Admitting: Family Medicine

## 2021-03-10 ENCOUNTER — Encounter: Payer: Self-pay | Admitting: Family Medicine

## 2021-03-11 ENCOUNTER — Telehealth: Payer: Self-pay | Admitting: Family Medicine

## 2021-03-11 DIAGNOSIS — Z96612 Presence of left artificial shoulder joint: Secondary | ICD-10-CM | POA: Diagnosis not present

## 2021-03-11 DIAGNOSIS — M25512 Pain in left shoulder: Secondary | ICD-10-CM | POA: Diagnosis not present

## 2021-03-11 NOTE — Telephone Encounter (Signed)
Patient is requesting PT for her hip, she has had success with PT in the past. Would like to be referred to [facility] on [address]

## 2021-03-14 ENCOUNTER — Other Ambulatory Visit: Payer: Self-pay

## 2021-03-14 ENCOUNTER — Encounter: Payer: Self-pay | Admitting: Family Medicine

## 2021-03-14 DIAGNOSIS — M7062 Trochanteric bursitis, left hip: Secondary | ICD-10-CM

## 2021-03-15 NOTE — Telephone Encounter (Signed)
Referral sent to [facility] 03/14/2021.

## 2021-03-28 ENCOUNTER — Ambulatory Visit (INDEPENDENT_AMBULATORY_CARE_PROVIDER_SITE_OTHER): Payer: Medicare Other | Admitting: Psychiatry

## 2021-03-28 ENCOUNTER — Other Ambulatory Visit: Payer: Self-pay

## 2021-03-28 ENCOUNTER — Encounter: Payer: Self-pay | Admitting: Psychiatry

## 2021-03-28 VITALS — BP 165/81 | HR 75

## 2021-03-28 DIAGNOSIS — F325 Major depressive disorder, single episode, in full remission: Secondary | ICD-10-CM | POA: Diagnosis not present

## 2021-03-28 DIAGNOSIS — F900 Attention-deficit hyperactivity disorder, predominantly inattentive type: Secondary | ICD-10-CM | POA: Diagnosis not present

## 2021-03-28 DIAGNOSIS — F5105 Insomnia due to other mental disorder: Secondary | ICD-10-CM

## 2021-03-28 MED ORDER — FLUOXETINE HCL 40 MG PO CAPS: 40.0000 mg | ORAL_CAPSULE | Freq: Every day | ORAL | 1 refills | Status: DC

## 2021-03-28 MED ORDER — ALPRAZOLAM 1 MG PO TABS: ORAL_TABLET | ORAL | 1 refills | Status: DC

## 2021-03-28 MED ORDER — METHYLPHENIDATE HCL ER (OSM) 36 MG PO TBCR: 72.0000 mg | EXTENDED_RELEASE_TABLET | ORAL | 0 refills | Status: DC

## 2021-03-28 NOTE — Progress Notes (Signed)
Jessica [name] [id number] September 24, 1950 70 y.o.  Subjective:   Patient ID:  Jessica [name] is a 70 y.o. (DOB [birth year]) female.  Chief Complaint:  Chief Complaint  Patient presents with   Follow-up   Attention deficit hyperactivity disorder (ADHD), predominan   Major depression in complete remission     HPI Jessica [name] presents to the office today for follow-up of ADD, anxeity, sleep and depression.  visit June 2020.  She was having some cognitive complaints and it was suggested that she add lithium 150 mg daily for his neuro protective effect as well as N-acetylcysteine 600 mg daily for cognitive benefit.  No other meds were changed.  She called back September 11 stating Wellbutrin was making her dizzy and shaky and she was going to reduce the dose to 150 mg and potentially stop it.  She remained on fluoxetine 20 mg, Concerta 36 mg and alprazolam 1 to 1-1/2 mg nightly as needed insomnia  December 2020 appointment with the following noted: Good overall except son still at home and causing problems. No depression relapse off Wellbutrin.  Stopped it DT tremor which resolved.  Overall satisfied with meds.  Great overall. Tolerating.   No meds were changed.  She continued Concerta 72 mg every morning, fluoxetine 20, lithium 150, alprazolam 1 mg 1 to 1-1/2 tablets nightly for sleep.  10/06/2019 appointment with the following noted:  lithium stopped DT elevated Cr and BUN. Doing OK overall. Continues other meds.  Sleep is OK with Xanax 1 mg hs usually..  No SE. Asked questions about Ketamine [facility] [place] for  depression for son.   Son has tried and failed multiple meds Plan no med changes  04/06/20 appt with following noted: Needs Xanax lately regularly for sleep.  Son is going through a lot. Doing OK with meds: Lithium 150, prozac 20, Concerta 72.  NAC.   No SE.    Sees cognitive benefit from lithium and NAC. Good health except indigestion.  Son borderline.   Plan: No med changes  10/19/2020  appointment with the following noted: Still taking Concerta and fluoxetine 20 Just had shoulder replacement.  Not doing much for enjoyment and son still living at times and doing nothing. Son tried Financial planner and [employer] without help. No SE Prozac.    Patient reports stable mood and denies depressed or irritable moods.  Patient denies any recent difficulty with anxiety.  Patient denies difficulty with sleep initiation or maintenance. Denies appetite disturbance.  Patient reports that energy and motivation have been good.  Patient denies any difficulty with concentration.  Patient denies any suicidal ideation. Needs Xanax for sleep   Usually 1mg  hs.  03/28/21 appt noted: Has not been able to get Concerta for 3 mos bc [facility] said they didn't have it.  Much more scattered without it. Still on fluoxetine 40 mg , Xanax 1-1.5 mg HS,  Typically sleeps well. Feels better on increased Prozac.  Patient reports stable mood and denies depressed or irritable moods.  Patient denies any recent difficulty with anxiety.  Patient denies difficulty with sleep initiation or maintenance. Denies appetite disturbance.  Patient reports that energy and motivation have been good.  Patient has worse difficulty with concentration.  Patient denies any suicidal ideation.   Disc concerns about memory in detail.  Function ok but losese things.  Satisfied with the Concerta for ADD. Duration about 12 hours.   Is forgetful but not sure why.    Past Psychiatric Medication Trials:  Abilify SE, trazodone,  fluoxetine 20,  Wellbutrin 740, Concerta 72, Adderall SE jittery  Xanax hs, Ambien, Lexapro fatigue    Son dep and OCD.   Review of Systems:  Review of Systems  Cardiovascular:  Negative for palpitations.  Neurological:  Negative for dizziness, tremors and weakness.  Psychiatric/Behavioral:  Positive for decreased concentration. Negative for agitation, behavioral problems, confusion, dysphoric mood, hallucinations, self-injury,  sleep disturbance and suicidal ideas. The patient is not nervous/anxious and is not hyperactive.    Medications: I have reviewed the patient's current medications.  Current Outpatient Medications  Medication Sig Dispense Refill   Acetylcysteine 600 MG CAPS Take 1 capsule (600 mg total) by mouth daily. 90 capsule 3   Ascorbic Acid (VITAMIN C PO) Take 1 tablet by mouth daily.     ESTRACE VAGINAL 0.1 MG/GM vaginal cream Place 1 Applicatorful vaginally as needed.     methylphenidate (CONCERTA) 36 MG PO CR tablet Take 2 tablets (72 mg total) by mouth every morning. 60 tablet 0   NASONEX 50 MCG/ACT nasal spray PLACE 2 SPRAYS INTO THE NOSE DAILY AS NEEDED. 17 g 5   olmesartan (BENICAR) 20 MG tablet Take 20 mg by mouth daily.     rosuvastatin (CRESTOR) 10 MG tablet Take 1 tablet by mouth at bedtime.     Zoster Vaccine Adjuvanted [facility]) injection Inject into the muscle. (Patient taking differently: Inject into the muscle. Has had one injection) 0.5 mL 0   ALPRAZolam (XANAX) 1 MG tablet TAKE 1 TO 1 AND 1/2 TABLETS(1 TO 1.5 MG) BY MOUTH AT BEDTIME AS NEEDED FOR ANXIETY 135 tablet 1   FLUoxetine (PROZAC) 40 MG capsule Take 1 capsule (40 mg total) by mouth daily. 90 capsule 1   lithium carbonate 150 MG capsule Take 1 capsule (150 mg total) by mouth at bedtime. (Patient not taking: Reported on 03/28/2021) 90 capsule 3   methylphenidate (CONCERTA) 36 MG PO CR tablet Take 2 tablets (72 mg total) by mouth every morning. 180 tablet 0   No current facility-administered medications for this visit.    Medication Side Effects: None  Allergies:  Allergies  Allergen Reactions   Demerol  [Meperidine] Nausea And Vomiting   Metoprolol     Nausea and inability to increase heart rate with cardiovascular exercise   Oxycodone-Acetaminophen Nausea Only   Sulfa Antibiotics Nausea And Vomiting    Past Medical History:  Diagnosis Date   Allergic rhinitis    seasonal   Anemia    Arthritis    GERD  (gastroesophageal reflux disease)    Hypertension    Pain in shoulder    right side   SOB (shortness of breath) on exertion     Family History  Problem Relation Age of Onset   Stroke Mother 48   Dementia Mother    Stroke Father 58   Heart disease Brother 13       LAD stenting   Colon cancer Maternal Uncle    Diabetes Son        Type 1   Prostate cancer Maternal Uncle    Stroke Paternal Grandmother        mid 50s   Hypertension Paternal Grandmother     Social History   Socioeconomic History   Marital status: Married    Spouse name: Not on file   Number of children: Not on file   Years of education: Not on file   Highest education level: Not on file  Occupational History   Not on file  Tobacco Use  Smoking status: Former    Types: Cigarettes    Quit date: 04/24/1976    Years since quitting: 44.9   Smokeless tobacco: Never   Tobacco comments:    smoked [age], up to 1ppd  Vaping Use   Vaping Use: Never used  Substance and Sexual Activity   Alcohol use: Yes    Comment: wine-2-3 days per week    Drug use: No   Sexual activity: Not on file  Other Topics Concern   Not on file  Social History Narrative   Not on file   Social Determinants of Health   Financial Resource Strain: Not on file  Food Insecurity: Not on file  Transportation Needs: Not on file  Physical Activity: Not on file  Stress: Not on file  Social Connections: Not on file  Intimate Partner Violence: Not on file    Past Medical History, Surgical history, Social history, and Family history were reviewed and updated as appropriate.   M had dementia onset 66's.  Please see review of systems for further details on the patient's review from today.   Objective:   Physical Exam:  BP (!) 165/81   Pulse 75   Physical Exam Constitutional:      General: She is not in acute distress. Musculoskeletal:        General: No deformity.  Neurological:     Mental Status: She is alert and oriented to  person, place, and time.     Coordination: Coordination normal.     Gait: Gait normal.  Psychiatric:        Attention and Perception: She is inattentive.        Mood and Affect: Mood is not anxious or depressed. Affect is not labile, blunt, angry or inappropriate.        Speech: Speech normal. Speech is not slurred.        Behavior: Behavior normal.        Thought Content: Thought content normal. Thought content is not paranoid. Thought content does not include homicidal or suicidal ideation.        Cognition and Memory: Cognition normal.        Judgment: Judgment normal.     Comments: Insight intact. No auditory or visual hallucinations. No delusions.     Lab Review:     Component Value Date/Time   NA 136 06/09/2020 1349   K 4.6 06/09/2020 1349   CL 101 06/09/2020 1349   CO2 29 06/09/2020 1349   GLUCOSE 105 (H) 06/09/2020 1349   BUN 19 06/09/2020 1349   CREATININE 0.85 06/09/2020 1349   CALCIUM 9.6 06/09/2020 1349   PROT 7.3 05/18/2014 1321   ALBUMIN 4.5 05/18/2014 1321   AST 25 05/18/2014 1321   ALT 18 05/18/2014 1321   ALKPHOS 45 05/18/2014 1321   BILITOT 0.5 05/18/2014 1321   GFRNONAA >60 06/09/2020 1349   GFRAA >90 10/07/2013 0840       Component Value Date/Time   WBC 7.9 06/09/2020 1349   RBC 3.80 (L) 06/09/2020 1349   HGB 12.0 06/09/2020 1349   HCT 36.2 06/09/2020 1349   PLT 301 06/09/2020 1349   MCV 95.3 06/09/2020 1349   MCH 31.6 06/09/2020 1349   MCHC 33.1 06/09/2020 1349   RDW 11.4 (L) 06/09/2020 1349   LYMPHSABS 2.4 05/18/2014 1321   MONOABS 0.4 05/18/2014 1321   EOSABS 0.1 05/18/2014 1321   BASOSABS 0.0 05/18/2014 1321    No results found for: POCLITH, LITHIUM   No results  found for: PHENYTOIN, PHENOBARB, VALPROATE, CBMZ   .res Assessment: Plan:    Sanae was seen today for follow-up, attention deficit hyperactivity disorder (adhd), predominan and major depression in complete remission .  Diagnoses and all orders for this visit:  Attention  deficit hyperactivity disorder (ADHD), predominantly inattentive type -     methylphenidate (CONCERTA) 36 MG PO CR tablet; Take 2 tablets (72 mg total) by mouth every morning.  Major depressive disorder with single episode, in full remission (HCC) -     FLUoxetine (PROZAC) 40 MG capsule; Take 1 capsule (40 mg total) by mouth daily.  Insomnia due to mental condition -     ALPRAZolam (XANAX) 1 MG tablet; TAKE 1 TO 1 AND 1/2 TABLETS(1 TO 1.5 MG) BY MOUTH AT BEDTIME AS NEEDED FOR ANXIETY  RO MCI  Greater than 50% of 30 min face to face time with patient was spent on counseling and coordination of care. We discussed  Overall mood is better with the increase in fluoxetine to 40 mg.  She tolerated it well.  However cognition is much worse because she has been without the Concerta for 3 months because of the [employer].  Continue NAC for mild cognitive complaints. Disc Dr. Olene Floss article on Lithium and memory DT mother's hx dementia.  No problems off the lithium DT elevation mildly in CR.  Level not in Epic. Continue NAC (N-acetylcysteine) 600 mg capsule 1 daily for memory and concentration. Disc recent availability problems.  Disc dealing with med shortages and strategies around this problem.  She has missed the Concerta for 3 months because she was told it was not available.  However other patients are getting it from other pharmacies.  We will switch to [facility].  This is likely to resolve after the new year. Restart Concerta 72 mg every morning because markedly worse ADD over the last 3 months without it.  Disc risk LT PPI on suppressing B12 and to keep an eye on this with PCP bc of cog concerns.  We discussed the short-term risks associated with benzodiazepines including sedation and increased fall risk among others.  Discussed long-term side effect risk including dependence, potential withdrawal symptoms, and the potential eventual dose-related risk of dementia.  But recent studies from 2020  dispute this association between benzodiazepines and dementia risk. Newer studies in 2020 do not support an association with dementia. Continue Xanax 1 to 1-1/2 mg nightly for insomnia. Lowest dosage of Bz.  Consider neuropsychological testing  Increased fluoxetine to 40 mg daily helped residual depression.  Answered questions she had about new type of [place] for treatment resistant depression for the sake of her son.  Also discussed there is a variant of [place] that is FDA approved for OCD as well.  This appt was 30 mins.  Fu 2-3 mos  Lynder Parents MD, DFAPA Please see After Visit Summary for patient specific instructions.  Future Appointments  Date Time Provider [place]  03/29/2021 11:00 AM [facility] [place] [facility]  05/10/2021  9:30 AM Lyndal Pulley, DO [facility] None  09/27/2021  3:00 PM Cottle, [place]., MD [facility] None     No orders of the defined types were placed in this encounter.     -------------------------------

## 2021-03-29 ENCOUNTER — Telehealth: Payer: Self-pay | Admitting: Psychiatry

## 2021-03-29 ENCOUNTER — Other Ambulatory Visit: Payer: Self-pay

## 2021-03-29 ENCOUNTER — Ambulatory Visit: Payer: Medicare Other | Attending: Internal Medicine

## 2021-03-29 ENCOUNTER — Other Ambulatory Visit (HOSPITAL_BASED_OUTPATIENT_CLINIC_OR_DEPARTMENT_OTHER): Payer: Self-pay

## 2021-03-29 DIAGNOSIS — F900 Attention-deficit hyperactivity disorder, predominantly inattentive type: Secondary | ICD-10-CM

## 2021-03-29 DIAGNOSIS — Z23 Encounter for immunization: Secondary | ICD-10-CM

## 2021-03-29 MED ORDER — METHYLPHENIDATE HCL ER (OSM) 36 MG PO TBCR: 72.0000 mg | EXTENDED_RELEASE_TABLET | ORAL | 0 refills | Status: DC

## 2021-03-29 MED ORDER — PFIZER COVID-19 VAC BIVALENT 30 MCG/0.3ML IM SUSP
INTRAMUSCULAR | 0 refills | Status: DC
  Filled 2021-03-29: qty 0.3, 1d supply, fill #0

## 2021-03-29 NOTE — Telephone Encounter (Signed)
Error

## 2021-03-29 NOTE — Telephone Encounter (Signed)
Cancelled and pended  

## 2021-03-29 NOTE — Telephone Encounter (Signed)
Pt LVM wanting refill of Methyphenidate 72mg  sent to [facility] [address], where it's available.  Next appt 6/6

## 2021-03-29 NOTE — Progress Notes (Signed)
   Covid-19 Vaccination Clinic  Name:  ZEIDY TAYAG    MRN: [id number] DOB: 1950-06-26  03/29/2021  Ms. Favor was observed post Covid-19 immunization for 15 minutes without incident. She was provided with Vaccine Information Sheet and instruction to access the V-Safe system.   Ms. Weich was instructed to call 911 with any severe reactions post vaccine: Difficulty breathing  Swelling of face and throat  A fast heartbeat  A bad rash all over body  Dizziness and weakness   Immunizations Administered     Name Date Dose VIS Date Route   Pfizer Covid-19 Vaccine Bivalent Booster 03/29/2021 11:15 AM 0.3 mL 12/22/2020 Intramuscular   Manufacturer: [place]   Lot: [id number]   [place]: [phone]

## 2021-03-31 DIAGNOSIS — M7632 Iliotibial band syndrome, left leg: Secondary | ICD-10-CM | POA: Diagnosis not present

## 2021-04-11 NOTE — Progress Notes (Deleted)
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  Discussed with patient about icing regimen and home exercises.  Discussed avoiding certain activities.  Do believe that this is contributing to some of the discomfort as well down the leg.  Discussed differential does include the possibility of lumbar radiculopathy and we will get x-rays of the lumbar spine and hip today to further evaluate.  Patient would like to start with topical anti-inflammatories, icing regimen, and given home exercises by athletic trainer today.  Depending on how patient responds patient will follow up with me again in 4 to 6 weeks and if worsening pain consider injection and formal physical therapy.  Updated 04/12/2021 Jessica [name] is a 70 y.o. female coming in with complaint of left hip pain  Xray IMPRESSION: Degenerative change with anterolisthesis of L4 on L5.        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   Procedure: REVERSE SHOULDER ARTHROPLASTY;  Surgeon: Justice Britain, MD;  [name];  Service: Orthopedics;  Laterality: Left;  19min   SHOULDER ARTHROSCOPY WITH OPEN ROTATOR CUFF REPAIR AND DISTAL CLAVICLE ACROMINECTOMY Right 10/09/2013   Procedure: RIGHT SHOULDER ARTHROSCOPY, SUBACROMIAL DECOMPRESSION, DISTAL CLAVICLE RESECTION, ROTATOR CUFF REPAIR  AND POSSIBLE OPEN ROTATOR CUFF REPAIR ;  Surgeon: Marin Shutter, MD;   Location: [place];  Service: Orthopedics;  Laterality: Right;   TUBAL LIGATION     Social History   Socioeconomic History   Marital status: Married    Spouse name: Not on file   Number of children: Not on file   Years of education: Not on file   Highest education level: Not on file  Occupational History   Not on file  Tobacco Use   Smoking status: Former    Types: Cigarettes    Quit date: 04/24/1976    Years since quitting: 44.9   Smokeless tobacco: Never   Tobacco comments:    smoked [age], up to 1ppd  Vaping Use   Vaping Use: Never used  Substance and Sexual Activity   Alcohol use: Yes    Comment: wine-2-3 days per week    Drug use: No   Sexual activity: Not on file  Other Topics Concern   Not on file  Social History Narrative   Not on file   Social Determinants of Health   Financial Resource Strain: Not on file  Food Insecurity: Not on file  Transportation Needs: Not on file  Physical Activity: Not on file  Stress: Not on file  Social Connections: Not on file   Allergies  Allergen Reactions   Demerol  [Meperidine] Nausea And Vomiting   Metoprolol     Nausea and inability to increase heart rate with cardiovascular exercise   Oxycodone-Acetaminophen Nausea Only   Sulfa Antibiotics Nausea And Vomiting  Family History  Problem Relation Age of Onset   Stroke Mother 13   Dementia Mother    Stroke Father 58   Heart disease Brother 71       LAD stenting   Colon cancer Maternal Uncle    Diabetes Son        Type 1   Prostate cancer Maternal Uncle    Stroke Paternal Grandmother        mid 74s   Hypertension Paternal Grandmother      Current Outpatient Medications (Cardiovascular):    olmesartan (BENICAR) 20 MG tablet, Take 20 mg by mouth daily.   rosuvastatin (CRESTOR) 10 MG tablet, Take 1 tablet by mouth at bedtime.  Current Outpatient Medications (Respiratory):    NASONEX 50 MCG/ACT nasal spray, PLACE 2 SPRAYS INTO THE NOSE DAILY AS  NEEDED.    Current Outpatient Medications (Other):    Acetylcysteine 600 MG CAPS, Take 1 capsule (600 mg total) by mouth daily.   ALPRAZolam (XANAX) 1 MG tablet, TAKE 1 TO 1 AND 1/2 TABLETS(1 TO 1.5 MG) BY MOUTH AT BEDTIME AS NEEDED FOR ANXIETY   Ascorbic Acid (VITAMIN C PO), Take 1 tablet by mouth daily.   COVID-19 mRNA bivalent vaccine, Pfizer, (PFIZER COVID-19 VAC BIVALENT) injection, Inject into the muscle.   ESTRACE VAGINAL 0.1 MG/GM vaginal cream, Place 1 Applicatorful vaginally as needed.   FLUoxetine (PROZAC) 40 MG capsule, Take 1 capsule (40 mg total) by mouth daily.   lithium carbonate 150 MG capsule, Take 1 capsule (150 mg total) by mouth at bedtime. (Patient not taking: Reported on 03/28/2021)   methylphenidate (CONCERTA) 36 MG PO CR tablet, Take 2 tablets (72 mg total) by mouth every morning.   methylphenidate (CONCERTA) 36 MG PO CR tablet, Take 2 tablets (72 mg total) by mouth every morning.   Zoster Vaccine Adjuvanted [facility]) injection, Inject into the muscle. (Patient taking differently: Inject into the muscle. Has had one injection)   Reviewed prior external information including notes and imaging from  primary care provider As well as notes that were available from care everywhere and other healthcare systems.  Past medical history, social, surgical and family history all reviewed in electronic medical record.  No pertanent information unless stated regarding to the chief complaint.   Review of Systems:  No headache, visual changes, nausea, vomiting, diarrhea, constipation, dizziness, abdominal pain, skin rash, fevers, chills, night sweats, weight loss, swollen lymph nodes, body aches, joint swelling, chest pain, shortness of breath, mood changes. POSITIVE muscle aches  Objective  There were no vitals taken for this visit.   General: No apparent distress alert and oriented x3 mood and affect normal, dressed appropriately.  HEENT: Pupils equal, extraocular movements  intact  Respiratory: Patient's speak in full sentences and does not appear short of breath  Cardiovascular: No lower extremity edema, non tender, no erythema  Gait normal with good balance and coordination.  MSK:  Non tender with full range of motion and good stability and symmetric strength and tone of shoulders, elbows, wrist, hip, knee and ankles bilaterally.     Impression and Recommendations:     The above documentation has been reviewed and is accurate and complete Belva Agee

## 2021-04-12 ENCOUNTER — Ambulatory Visit: Payer: Medicare Other | Admitting: Family Medicine

## 2021-05-04 ENCOUNTER — Ambulatory Visit: Payer: Medicare Other | Admitting: Family Medicine

## 2021-05-10 ENCOUNTER — Ambulatory Visit: Payer: Medicare Other | Admitting: Family Medicine

## 2021-05-23 DIAGNOSIS — M25552 Pain in left hip: Secondary | ICD-10-CM | POA: Diagnosis not present

## 2021-05-25 DIAGNOSIS — M25552 Pain in left hip: Secondary | ICD-10-CM | POA: Insufficient documentation

## 2021-06-28 ENCOUNTER — Telehealth: Payer: Self-pay

## 2021-06-28 ENCOUNTER — Telehealth: Payer: Self-pay | Admitting: Psychiatry

## 2021-06-28 ENCOUNTER — Other Ambulatory Visit: Payer: Self-pay | Admitting: Psychiatry

## 2021-06-28 DIAGNOSIS — F900 Attention-deficit hyperactivity disorder, predominantly inattentive type: Secondary | ICD-10-CM

## 2021-06-28 MED ORDER — METHYLPHENIDATE HCL ER (OSM) 36 MG PO TBCR: 72.0000 mg | EXTENDED_RELEASE_TABLET | ORAL | 0 refills | Status: DC

## 2021-06-28 NOTE — Telephone Encounter (Signed)
Pt LVM @@ 1:48p.  She would like refill of Methylphenidate sent to [facility]. ? ?Next appt 6/6 ?

## 2021-06-28 NOTE — Telephone Encounter (Signed)
Pended.

## 2021-07-01 ENCOUNTER — Other Ambulatory Visit: Payer: Self-pay

## 2021-07-01 DIAGNOSIS — F900 Attention-deficit hyperactivity disorder, predominantly inattentive type: Secondary | ICD-10-CM

## 2021-07-01 MED ORDER — METHYLPHENIDATE HCL ER (OSM) 36 MG PO TBCR: 72.0000 mg | EXTENDED_RELEASE_TABLET | ORAL | 0 refills | Status: DC

## 2021-07-01 NOTE — Telephone Encounter (Signed)
Cancelled and pended  

## 2021-07-01 NOTE — Telephone Encounter (Signed)
The march script is showing [facility] and she wanted [facility]. ?

## 2021-07-01 NOTE — Telephone Encounter (Signed)
I will cancel and pend to correct pharmacy  ?

## 2021-07-01 NOTE — Telephone Encounter (Signed)
Pt called at 11:30 and said that [facility] concerta [facility] [address].Per insurance this is what she needs to use. Please take out all other pharmacies [facility] [address]. Please cancel and resubmit ?

## 2021-07-19 DIAGNOSIS — M810 Age-related osteoporosis without current pathological fracture: Secondary | ICD-10-CM | POA: Diagnosis not present

## 2021-07-19 DIAGNOSIS — I10 Essential (primary) hypertension: Secondary | ICD-10-CM | POA: Diagnosis not present

## 2021-07-19 DIAGNOSIS — M8589 Other specified disorders of bone density and structure, multiple sites: Secondary | ICD-10-CM | POA: Diagnosis not present

## 2021-07-19 DIAGNOSIS — N1831 Chronic kidney disease, stage 3a: Secondary | ICD-10-CM | POA: Diagnosis not present

## 2021-07-19 DIAGNOSIS — E785 Hyperlipidemia, unspecified: Secondary | ICD-10-CM | POA: Diagnosis not present

## 2021-07-19 DIAGNOSIS — M8580 Other specified disorders of bone density and structure, unspecified site: Secondary | ICD-10-CM | POA: Diagnosis not present

## 2021-07-25 DIAGNOSIS — M7062 Trochanteric bursitis, left hip: Secondary | ICD-10-CM | POA: Diagnosis not present

## 2021-07-25 DIAGNOSIS — M810 Age-related osteoporosis without current pathological fracture: Secondary | ICD-10-CM | POA: Diagnosis not present

## 2021-07-25 DIAGNOSIS — Z1331 Encounter for screening for depression: Secondary | ICD-10-CM | POA: Diagnosis not present

## 2021-07-25 DIAGNOSIS — Z0000 Encounter for general adult medical examination without abnormal findings: Secondary | ICD-10-CM | POA: Diagnosis not present

## 2021-07-25 DIAGNOSIS — Z1339 Encounter for screening examination for other mental health and behavioral disorders: Secondary | ICD-10-CM | POA: Diagnosis not present

## 2021-07-25 DIAGNOSIS — E785 Hyperlipidemia, unspecified: Secondary | ICD-10-CM | POA: Diagnosis not present

## 2021-07-25 DIAGNOSIS — I129 Hypertensive chronic kidney disease with stage 1 through stage 4 chronic kidney disease, or unspecified chronic kidney disease: Secondary | ICD-10-CM | POA: Diagnosis not present

## 2021-07-25 DIAGNOSIS — N1831 Chronic kidney disease, stage 3a: Secondary | ICD-10-CM | POA: Diagnosis not present

## 2021-07-25 DIAGNOSIS — K219 Gastro-esophageal reflux disease without esophagitis: Secondary | ICD-10-CM | POA: Diagnosis not present

## 2021-07-25 DIAGNOSIS — F418 Other specified anxiety disorders: Secondary | ICD-10-CM | POA: Diagnosis not present

## 2021-07-25 DIAGNOSIS — R82998 Other abnormal findings in urine: Secondary | ICD-10-CM | POA: Diagnosis not present

## 2021-07-25 DIAGNOSIS — I10 Essential (primary) hypertension: Secondary | ICD-10-CM | POA: Diagnosis not present

## 2021-09-04 ENCOUNTER — Other Ambulatory Visit: Payer: Self-pay | Admitting: Psychiatry

## 2021-09-04 DIAGNOSIS — F5105 Insomnia due to other mental disorder: Secondary | ICD-10-CM

## 2021-09-05 ENCOUNTER — Other Ambulatory Visit: Payer: Self-pay | Admitting: Psychiatry

## 2021-09-05 DIAGNOSIS — F325 Major depressive disorder, single episode, in full remission: Secondary | ICD-10-CM

## 2021-09-05 MED ORDER — FLUOXETINE HCL 20 MG PO CAPS: 20.0000 mg | ORAL_CAPSULE | Freq: Every day | ORAL | 0 refills | Status: DC

## 2021-09-05 NOTE — Telephone Encounter (Signed)
Pt lvm that  she want s to go down on the prozac back to 20 mg. She said that the 40 mg was too strong. She is out of the 20's so she [facility] and a [facility] supply [facility] on [address] ?

## 2021-09-27 ENCOUNTER — Ambulatory Visit: Payer: Medicare Other | Admitting: Psychiatry

## 2021-11-08 ENCOUNTER — Encounter: Payer: Self-pay | Admitting: Psychiatry

## 2021-11-08 ENCOUNTER — Ambulatory Visit (INDEPENDENT_AMBULATORY_CARE_PROVIDER_SITE_OTHER): Payer: Medicare Other | Admitting: Psychiatry

## 2021-11-08 DIAGNOSIS — F325 Major depressive disorder, single episode, in full remission: Secondary | ICD-10-CM

## 2021-11-08 DIAGNOSIS — F900 Attention-deficit hyperactivity disorder, predominantly inattentive type: Secondary | ICD-10-CM | POA: Diagnosis not present

## 2021-11-08 DIAGNOSIS — F5105 Insomnia due to other mental disorder: Secondary | ICD-10-CM | POA: Diagnosis not present

## 2021-11-08 MED ORDER — FLUOXETINE HCL 20 MG PO CAPS: 20.0000 mg | ORAL_CAPSULE | Freq: Every day | ORAL | 1 refills | Status: DC

## 2021-11-08 MED ORDER — ALPRAZOLAM 1 MG PO TABS: ORAL_TABLET | ORAL | 0 refills | Status: DC

## 2021-11-08 MED ORDER — METHYLPHENIDATE HCL ER (OSM) 36 MG PO TBCR: 72.0000 mg | EXTENDED_RELEASE_TABLET | Freq: Every day | ORAL | 0 refills | Status: DC

## 2021-11-08 MED ORDER — METHYLPHENIDATE HCL ER (OSM) 36 MG PO TBCR: 72.0000 mg | EXTENDED_RELEASE_TABLET | ORAL | 0 refills | Status: DC

## 2021-11-08 NOTE — Progress Notes (Addendum)
Jessica [name] [id number] Dec 14, 1950 71 y.o.  Subjective:   Patient ID:  Jessica [name] is a 71 y.o. (DOB 04/20/1951) female.  Chief Complaint:  Chief Complaint  Patient presents with   ADHD   Depression   Follow-up    HPI Jessica [name] presents to the office today for follow-up of ADD, anxeity, sleep and depression.  visit June 2020.  She was having some cognitive complaints and it was suggested that she add lithium 150 mg daily for his neuro protective effect as well as N-acetylcysteine 600 mg daily for cognitive benefit.  No other meds were changed.  She called back September 11 stating Wellbutrin was making her dizzy and shaky and she was going to reduce the dose to 150 mg and potentially stop it.  She remained on fluoxetine 20 mg, Concerta 36 mg and alprazolam 1 to 1-1/2 mg nightly as needed insomnia  December 2020 appointment with the following noted: Good overall except son still at home and causing problems. No depression relapse off Wellbutrin.  Stopped it DT tremor which resolved.  Overall satisfied with meds.  Great overall. Tolerating.   No meds were changed.  She continued Concerta 72 mg every morning, fluoxetine 20, lithium 150, alprazolam 1 mg 1 to 1-1/2 tablets nightly for sleep.  10/06/2019 appointment with the following noted:  lithium stopped DT elevated Cr and BUN. Doing OK overall. Continues other meds.  Sleep is OK with Xanax 1 mg hs usually..  No SE. Asked questions about Ketamine [facility] [place] for  depression for son.   Son has tried and failed multiple meds Plan no med changes  04/06/20 appt with following noted: Needs Xanax lately regularly for sleep.  Son is going through a lot. Doing OK with meds: Lithium 150, prozac 20, Concerta 72.  NAC.   No SE.    Sees cognitive benefit from lithium and NAC. Good health except indigestion.  Son borderline.   Plan: No med changes  10/19/2020 appointment with the following noted: Still taking Concerta and fluoxetine 20 Just  had shoulder replacement.  Not doing much for enjoyment and son still living at times and doing nothing. Son tried Financial [employer] and [employer] without help. No SE Prozac.    Patient reports stable mood and denies depressed or irritable moods.  Patient denies any recent difficulty with anxiety.  Patient denies difficulty with sleep initiation or maintenance. Denies appetite disturbance.  Patient reports that energy and motivation have been good.  Patient denies any difficulty with concentration.  Patient denies any suicidal ideation. Needs Xanax for sleep   Usually '1mg'$  hs.  03/28/21 appt noted: Has not been able to get Concerta for 3 mos bc [facility].  Much more scattered without it. Still on fluoxetine 40 mg , Xanax 1-1.5 mg HS,  Typically sleeps well. Feels better on increased Prozac. Plan: Restart Concerta 72 mg every morning because markedly worse ADD over the last 3 months without it. Continue fluoxetine 40 mg and alprazolam 1 to 1-1/2 mg nightly  11/08/2021 appointment with the following noted: Doing ok. Rattled with son still home with OCD and depression.  He's doing ketamine tx. She's satisfied with meds and does better with Concerta 72 but can't always get it. NoSE Sleep fine with Xanax HS usually less than 1.5 mg . Doing ok with less prozac 20 mg now Patient reports stable mood and denies depressed or irritable moods.  Patient denies any recent difficulty with anxiety.  Patient denies  difficulty with sleep initiation or maintenance. Denies appetite disturbance.  Patient reports that energy and motivation have been good.  Patient has worse difficulty with concentration.  Patient denies any suicidal ideation.   Disc concerns about memory in detail.  Function ok but losese things.  Satisfied with the Concerta for ADD. Duration about 12 hours.   Is forgetful but not sure why.    Past Psychiatric Medication Trials:  Abilify SE,  fluoxetine 40 insomnia ,  Wellbutrin 300,    Lexapro fatigue   Concerta 72, Adderall SE jittery  Xanax hs, trazodone,  Ambien hangover,  Son dep and OCD. Less effective Spravato tx.  Review of Systems:  Review of Systems  Cardiovascular:  Negative for palpitations.  Neurological:  Negative for dizziness, tremors and weakness.  Psychiatric/Behavioral:  Positive for decreased concentration and sleep disturbance. Negative for agitation, behavioral problems, confusion, dysphoric mood, hallucinations, self-injury and suicidal ideas. The patient is not nervous/anxious and is not hyperactive.     Medications: I have reviewed the patient's current medications.  Current Outpatient Medications  Medication Sig Dispense Refill   Acetylcysteine 600 MG CAPS Take 1 capsule (600 mg total) by mouth daily. 90 capsule 3   Ascorbic Acid (VITAMIN C PO) Take 1 tablet by mouth daily.     COVID-19 mRNA bivalent vaccine, Pfizer, (PFIZER COVID-19 VAC BIVALENT) injection Inject into the muscle. 0.3 mL 0   ESTRACE VAGINAL 0.1 MG/GM vaginal cream Place 1 Applicatorful vaginally as needed.     lithium carbonate 150 MG capsule Take 1 capsule (150 mg total) by mouth at bedtime. 90 capsule 3   [START ON 01/03/2022] methylphenidate (CONCERTA) 36 MG PO CR tablet Take 2 tablets (72 mg total) by mouth daily. 60 tablet 0   NASONEX 50 MCG/ACT nasal spray PLACE 2 SPRAYS INTO THE NOSE DAILY AS NEEDED. 17 g 5   olmesartan (BENICAR) 20 MG tablet Take 20 mg by mouth daily.     rosuvastatin (CRESTOR) 10 MG tablet Take 1 tablet by mouth at bedtime.     Zoster Vaccine Adjuvanted [facility]) injection Inject into the muscle. (Patient taking differently: Inject into the muscle. Has had one injection) 0.5 mL 0   ALPRAZolam (XANAX) 1 MG tablet TAKE 1 TO ONE AND ONE-HALF TABLETS AT BEDTIME AS NEEDED FOR ANXIETY 135 tablet 0   FLUoxetine (PROZAC) 20 MG capsule Take 1 capsule (20 mg total) by mouth daily. 90 capsule 1   methylphenidate (CONCERTA) 36 MG PO CR tablet Take 2 tablets  (72 mg total) by mouth every morning. 60 tablet 0   [START ON 12/06/2021] methylphenidate (CONCERTA) 36 MG PO CR tablet Take 2 tablets (72 mg total) by mouth every morning. 60 tablet 0   No current facility-administered medications for this visit.    Medication Side Effects: None  Allergies:  Allergies  Allergen Reactions   Demerol  [Meperidine] Nausea And Vomiting   Metoprolol     Nausea and inability to increase heart rate with cardiovascular exercise   Oxycodone-Acetaminophen Nausea Only   Sulfa Antibiotics Nausea And Vomiting    Past Medical History:  Diagnosis Date   Allergic rhinitis    seasonal   Anemia    Arthritis    GERD (gastroesophageal reflux disease)    Hypertension    Pain in shoulder    right side   SOB (shortness of breath) on exertion     Family History  Problem Relation Age of Onset   Stroke Mother 69   Dementia Mother  Stroke Father 20   Heart disease Brother 25       LAD stenting   Colon cancer Maternal Uncle    Diabetes Son        Type 1   Prostate cancer Maternal Uncle    Stroke Paternal Grandmother        mid 69s   Hypertension Paternal Grandmother     Social History   Socioeconomic History   Marital status: Married    Spouse name: Not on file   Number of children: Not on file   Years of education: Not on file   Highest education level: Not on file  Occupational History   Not on file  Tobacco Use   Smoking status: Former    Types: Cigarettes    Quit date: 04/24/1976    Years since quitting: 45.5   Smokeless tobacco: Never   Tobacco comments:    smoked [age], up to 1ppd  Vaping Use   Vaping Use: Never used  Substance and Sexual Activity   Alcohol use: Yes    Comment: wine-2-3 days per week    Drug use: No   Sexual activity: Not on file  Other Topics Concern   Not on file  Social History Narrative   Not on file   Social Determinants of Health   Financial Resource Strain: Not on file  Food Insecurity: Not on file   Transportation Needs: Not on file  Physical Activity: Not on file  Stress: Not on file  Social Connections: Not on file  Intimate Partner Violence: Not on file    Past Medical History, Surgical history, Social history, and Family history were reviewed and updated as appropriate.   M had dementia onset 80's.  Please see review of systems for further details on the patient's review from today.   Objective:   Physical Exam:  There were no vitals taken for this visit.  Physical Exam Constitutional:      General: She is not in acute distress. Musculoskeletal:        General: No deformity.  Neurological:     Mental Status: She is alert and oriented to person, place, and time.     Coordination: Coordination normal.     Gait: Gait normal.  Psychiatric:        Attention and Perception: She is inattentive.        Mood and Affect: Mood is not anxious or depressed. Affect is not labile, blunt, angry or inappropriate.        Speech: Speech normal. Speech is not slurred.        Behavior: Behavior normal.        Thought Content: Thought content normal. Thought content is not paranoid or delusional. Thought content does not include homicidal or suicidal ideation.        Cognition and Memory: Cognition normal.        Judgment: Judgment normal.     Comments: Insight intact. No auditory or visual hallucinations. No delusions.      Lab Review:     Component Value Date/Time   NA 136 06/09/2020 1349   K 4.6 06/09/2020 1349   CL 101 06/09/2020 1349   CO2 29 06/09/2020 1349   GLUCOSE 105 (H) 06/09/2020 1349   BUN 19 06/09/2020 1349   CREATININE 0.85 06/09/2020 1349   CALCIUM 9.6 06/09/2020 1349   PROT 7.3 05/18/2014 1321   ALBUMIN 4.5 05/18/2014 1321   AST 25 05/18/2014 1321   ALT 18 05/18/2014 1321  ALKPHOS 45 05/18/2014 1321   BILITOT 0.5 05/18/2014 1321   GFRNONAA >60 06/09/2020 1349   GFRAA >90 10/07/2013 0840       Component Value Date/Time   WBC 7.9 06/09/2020 1349    RBC 3.80 (L) 06/09/2020 1349   HGB 12.0 06/09/2020 1349   HCT 36.2 06/09/2020 1349   PLT 301 06/09/2020 1349   MCV 95.3 06/09/2020 1349   MCH 31.6 06/09/2020 1349   MCHC 33.1 06/09/2020 1349   RDW 11.4 (L) 06/09/2020 1349   LYMPHSABS 2.4 05/18/2014 1321   MONOABS 0.4 05/18/2014 1321   EOSABS 0.1 05/18/2014 1321   BASOSABS 0.0 05/18/2014 1321    No results found for: "POCLITH", "LITHIUM"   No results found for: "PHENYTOIN", "PHENOBARB", "VALPROATE", "CBMZ"   .res Assessment: Plan:    Azriella was seen today for adhd, depression and follow-up.  Diagnoses and all orders for this visit:  Major depressive disorder with single episode, in full remission ([place]) -     FLUoxetine (PROZAC) 20 MG capsule; Take 1 capsule (20 mg total) by mouth daily.  Attention deficit hyperactivity disorder (ADHD), predominantly inattentive type -     methylphenidate (CONCERTA) 36 MG PO CR tablet; Take 2 tablets (72 mg total) by mouth every morning. -     methylphenidate (CONCERTA) 36 MG PO CR tablet; Take 2 tablets (72 mg total) by mouth every morning. -     methylphenidate (CONCERTA) 36 MG PO CR tablet; Take 2 tablets (72 mg total) by mouth daily.  Insomnia due to mental condition -     ALPRAZolam (XANAX) 1 MG tablet; TAKE 1 TO ONE AND ONE-HALF TABLETS AT BEDTIME AS NEEDED FOR ANXIETY   RO MCI  Greater than 50% of 30 min face to face time with patient was spent on counseling and coordination of care. We discussed  Overall mood is better with the increase in fluoxetine to 40 mg.  She tolerated it well.  However cognition is much worse because she has been intermittent with 72 mg Concerta because of the shortage.  Continue NAC for mild cognitive complaints. Disc Dr. Olene Floss article on Lithium and memory DT mother's hx dementia.  No problems off the lithium DT elevation mildly in CR.  Level not in Epic. Continue NAC (N-acetylcysteine) 600 mg capsule 1 daily for memory and concentration. Disc recent availability  problems.  Disc dealing with med shortages and strategies around this problem.  She has missed the Concerta for 3 months because she was told it was not available.  However other patients are getting it from other pharmacies.  We will switch to [facility].  This is likely to resolve after the new year. Continue Concerta 72 mg every morning because markedly worse ADD over the last 3 months without it.  Disc risk LT PPI on suppressing B12 and to keep an eye on this with PCP bc of cog concerns.  We discussed the short-term risks associated with benzodiazepines including sedation and increased fall risk among others.  Discussed long-term side effect risk including dependence, potential withdrawal symptoms, and the potential eventual dose-related risk of dementia.  But recent studies from 2020 dispute this association between benzodiazepines and dementia risk. Newer studies in 2020 do not support an association with dementia. Continue Xanax 1 to 1-1/2 mg nightly for insomnia. Lowest dosage of Bz.  continue fluoxetine 20 mg daily helped residual depression.  Answered questions she had about new type of [place] for treatment resistant depression for the sake of her son.  Also discussed there is a variant of [place] that is FDA approved for OCD as well.  This appt was 30 mins.  Fu 6 mos  Lynder Parents MD, DFAPA Please see After Visit Summary for patient specific instructions.  No future appointments.    No orders of the defined types were placed in this encounter.     -------------------------------

## 2021-11-10 ENCOUNTER — Other Ambulatory Visit: Payer: Self-pay

## 2021-11-10 DIAGNOSIS — M810 Age-related osteoporosis without current pathological fracture: Secondary | ICD-10-CM | POA: Insufficient documentation

## 2021-11-14 ENCOUNTER — Telehealth: Payer: Self-pay | Admitting: Pharmacy Technician

## 2021-11-14 NOTE — Telephone Encounter (Signed)
Auth Submission: NO AUTH NEEDED Payer: MEDICARE A/B & TRICARE Medication & CPT/J Code(s) submitted: Prolia (Denosumab) [id number] Route of submission (phone, fax, portal): PHONE Auth type: Buy/Bill Units/visits requested: X1 DOSE Reference number: [id number] Approval from: 11/14/21 to 04/23/22

## 2021-11-25 ENCOUNTER — Encounter: Payer: Self-pay | Admitting: Pulmonary Disease

## 2021-12-02 ENCOUNTER — Ambulatory Visit: Payer: Medicare Other

## 2021-12-05 DIAGNOSIS — Z681 Body mass index (BMI) 19 or less, adult: Secondary | ICD-10-CM | POA: Diagnosis not present

## 2021-12-05 DIAGNOSIS — Z124 Encounter for screening for malignant neoplasm of cervix: Secondary | ICD-10-CM | POA: Diagnosis not present

## 2021-12-05 DIAGNOSIS — Z1231 Encounter for screening mammogram for malignant neoplasm of breast: Secondary | ICD-10-CM | POA: Diagnosis not present

## 2022-01-16 ENCOUNTER — Other Ambulatory Visit (HOSPITAL_BASED_OUTPATIENT_CLINIC_OR_DEPARTMENT_OTHER): Payer: Self-pay

## 2022-01-16 ENCOUNTER — Encounter: Payer: Self-pay | Admitting: Pulmonary Disease

## 2022-01-16 DIAGNOSIS — Z23 Encounter for immunization: Secondary | ICD-10-CM | POA: Diagnosis not present

## 2022-01-16 MED ORDER — FLUAD QUADRIVALENT 0.5 ML IM PRSY
PREFILLED_SYRINGE | INTRAMUSCULAR | 0 refills | Status: AC
  Filled 2022-01-16: qty 0.5, 1d supply, fill #0

## 2022-01-17 DIAGNOSIS — L820 Inflamed seborrheic keratosis: Secondary | ICD-10-CM | POA: Diagnosis not present

## 2022-01-17 DIAGNOSIS — L821 Other seborrheic keratosis: Secondary | ICD-10-CM | POA: Diagnosis not present

## 2022-01-17 DIAGNOSIS — D2262 Melanocytic nevi of left upper limb, including shoulder: Secondary | ICD-10-CM | POA: Diagnosis not present

## 2022-01-17 DIAGNOSIS — L720 Epidermal cyst: Secondary | ICD-10-CM | POA: Diagnosis not present

## 2022-01-17 DIAGNOSIS — D2261 Melanocytic nevi of right upper limb, including shoulder: Secondary | ICD-10-CM | POA: Diagnosis not present

## 2022-01-17 DIAGNOSIS — D225 Melanocytic nevi of trunk: Secondary | ICD-10-CM | POA: Diagnosis not present

## 2022-01-17 DIAGNOSIS — D692 Other nonthrombocytopenic purpura: Secondary | ICD-10-CM | POA: Diagnosis not present

## 2022-02-10 ENCOUNTER — Other Ambulatory Visit: Payer: Self-pay | Admitting: Psychiatry

## 2022-02-10 ENCOUNTER — Other Ambulatory Visit (HOSPITAL_BASED_OUTPATIENT_CLINIC_OR_DEPARTMENT_OTHER): Payer: Self-pay

## 2022-02-10 DIAGNOSIS — F325 Major depressive disorder, single episode, in full remission: Secondary | ICD-10-CM

## 2022-02-10 DIAGNOSIS — Z23 Encounter for immunization: Secondary | ICD-10-CM | POA: Diagnosis not present

## 2022-02-10 MED ORDER — COMIRNATY 30 MCG/0.3ML IM SUSY
PREFILLED_SYRINGE | INTRAMUSCULAR | 0 refills | Status: DC
  Filled 2022-02-10: qty 0.3, 1d supply, fill #0

## 2022-02-13 NOTE — Telephone Encounter (Signed)
LVM to RC. Is she still on lithium?

## 2022-02-13 NOTE — Telephone Encounter (Signed)
Patient said she is still taking.

## 2022-02-15 ENCOUNTER — Other Ambulatory Visit (HOSPITAL_BASED_OUTPATIENT_CLINIC_OR_DEPARTMENT_OTHER): Payer: Self-pay

## 2022-03-27 DIAGNOSIS — Z96612 Presence of left artificial shoulder joint: Secondary | ICD-10-CM | POA: Diagnosis not present

## 2022-03-29 ENCOUNTER — Other Ambulatory Visit: Payer: Self-pay | Admitting: Psychiatry

## 2022-03-29 DIAGNOSIS — F5105 Insomnia due to other mental disorder: Secondary | ICD-10-CM

## 2022-03-30 NOTE — Telephone Encounter (Signed)
Filled 12/08/21 appt 05/11/22

## 2022-04-21 ENCOUNTER — Other Ambulatory Visit: Payer: Self-pay | Admitting: Psychiatry

## 2022-04-21 DIAGNOSIS — F325 Major depressive disorder, single episode, in full remission: Secondary | ICD-10-CM

## 2022-04-27 ENCOUNTER — Other Ambulatory Visit: Payer: Self-pay | Admitting: Psychiatry

## 2022-04-27 DIAGNOSIS — F325 Major depressive disorder, single episode, in full remission: Secondary | ICD-10-CM

## 2022-05-05 ENCOUNTER — Other Ambulatory Visit: Payer: Self-pay | Admitting: Psychiatry

## 2022-05-05 DIAGNOSIS — F900 Attention-deficit hyperactivity disorder, predominantly inattentive type: Secondary | ICD-10-CM

## 2022-05-05 MED ORDER — METHYLPHENIDATE HCL ER (OSM) 36 MG PO TBCR: 72.0000 mg | EXTENDED_RELEASE_TABLET | ORAL | 0 refills | Status: DC

## 2022-05-05 NOTE — Telephone Encounter (Signed)
Pt called and said that she needs a refill on her concerta  36 mg. [facility] [address]. They have it in stock

## 2022-05-05 NOTE — Telephone Encounter (Signed)
Pended.

## 2022-05-11 ENCOUNTER — Encounter: Payer: Self-pay | Admitting: Psychiatry

## 2022-05-11 ENCOUNTER — Encounter: Payer: Self-pay | Admitting: Pulmonary Disease

## 2022-05-11 ENCOUNTER — Ambulatory Visit (INDEPENDENT_AMBULATORY_CARE_PROVIDER_SITE_OTHER): Payer: Medicare Other | Admitting: Psychiatry

## 2022-05-11 VITALS — BP 146/93 | HR 92

## 2022-05-11 DIAGNOSIS — F900 Attention-deficit hyperactivity disorder, predominantly inattentive type: Secondary | ICD-10-CM | POA: Diagnosis not present

## 2022-05-11 DIAGNOSIS — F325 Major depressive disorder, single episode, in full remission: Secondary | ICD-10-CM

## 2022-05-11 DIAGNOSIS — F5105 Insomnia due to other mental disorder: Secondary | ICD-10-CM

## 2022-05-11 MED ORDER — METHYLPHENIDATE HCL ER (OSM) 36 MG PO TBCR: 72.0000 mg | EXTENDED_RELEASE_TABLET | Freq: Every day | ORAL | 0 refills | Status: DC

## 2022-05-11 MED ORDER — METHYLPHENIDATE HCL ER (OSM) 36 MG PO TBCR: 72.0000 mg | EXTENDED_RELEASE_TABLET | ORAL | 0 refills | Status: DC

## 2022-05-11 MED ORDER — FLUOXETINE HCL 20 MG PO CAPS: 20.0000 mg | ORAL_CAPSULE | Freq: Every day | ORAL | 1 refills | Status: DC

## 2022-05-11 MED ORDER — ALPRAZOLAM 1 MG PO TABS: ORAL_TABLET | ORAL | 1 refills | Status: DC

## 2022-05-11 NOTE — Progress Notes (Signed)
Jessica [name] [id number] 1950/04/29 72 y.o.  Subjective:   Patient ID:  Jessica [name] is a 72 y.o. (DOB 1950-09-27) female.  Chief Complaint:  Chief Complaint  Patient presents with   Follow-up   Depression   ADHD    HPI Jessica [name] presents to the office today for follow-up of ADD, anxeity, sleep and depression.  visit June 2020.  She was having some cognitive complaints and it was suggested that she add lithium 150 mg daily for his neuro protective effect as well as N-acetylcysteine 600 mg daily for cognitive benefit.  No other meds were changed.  She called back September 11 stating Wellbutrin was making her dizzy and shaky and she was going to reduce the dose to 150 mg and potentially stop it.  She remained on fluoxetine 20 mg, Concerta 36 mg and alprazolam 1 to 1-1/2 mg nightly as needed insomnia  December 2020 appointment with the following noted: Good overall except son still at home and causing problems. No depression relapse off Wellbutrin.  Stopped it DT tremor which resolved.  Overall satisfied with meds.  Great overall. Tolerating.   No meds were changed.  She continued Concerta 72 mg every morning, fluoxetine 20, lithium 150, alprazolam 1 mg 1 to 1-1/2 tablets nightly for sleep.  10/06/2019 appointment with the following noted:  lithium stopped DT elevated Cr and BUN. Doing OK overall. Continues other meds.  Sleep is OK with Xanax 1 mg hs usually..  No SE. Asked questions about Ketamine [facility]& [place] for  depression for son.   Son has tried and failed multiple meds Plan no med changes  04/06/20 appt with following noted: Needs Xanax lately regularly for sleep.  Son is going through a lot. Doing OK with meds: Lithium 150, prozac 20, Concerta 72.  NAC.   No SE.    Sees cognitive benefit from lithium and NAC. Good health except indigestion.  Son borderline.   Plan: No med changes  10/19/2020 appointment with the following noted: Still taking Concerta and fluoxetine 20 Just  had shoulder replacement.  Not doing much for enjoyment and son still living at times and doing nothing. Son tried [employer] and [employer] without help. No SE Prozac.    Patient reports stable mood and denies depressed or irritable moods.  Patient denies any recent difficulty with anxiety.  Patient denies difficulty with sleep initiation or maintenance. Denies appetite disturbance.  Patient reports that energy and motivation have been good.  Patient denies any difficulty with concentration.  Patient denies any suicidal ideation. Needs Xanax for sleep   Usually '1mg'$  hs.  03/28/21 appt noted: Has not been able to get Concerta for 3 mos bc [facility] said they didn't have it.  Much more scattered without it. Still on fluoxetine 40 mg , Xanax 1-1.5 mg HS,  Typically sleeps well. Feels better on increased Prozac. Plan: Restart Concerta 72 mg every morning because markedly worse ADD over the last 3 months without it. Continue fluoxetine 40 mg and alprazolam 1 to 1-1/2 mg nightly  11/08/2021 appointment with the following noted: Doing ok. Rattled with son still home with OCD and depression.  He's doing ketamine tx. She's satisfied with meds and does better with Concerta 72 but can't always get it. NoSE Sleep fine with Xanax HS usually less than 1.5 mg . Doing ok with less prozac 20 mg now Patient reports stable mood and denies depressed or irritable moods.  Patient denies any recent difficulty with anxiety.  Patient denies  difficulty with sleep initiation or maintenance. Denies appetite disturbance.  Patient reports that energy and motivation have been good.  Patient has worse difficulty with concentration.  Patient denies any suicidal ideation. No med changes  05/11/22 appt noted: Continues meds. Had Covid couple of mos ago. Not taking Prozac and was very irritable.  Better on it. Good.  Pleased with meds.  No SE.   No concerns with Concerta or meds. Usally alprazolam 1 mg HS.   Melatonin SE N  Disc  concerns about memory in detail.  Function ok but losese things.  Satisfied with the Concerta for ADD. Duration about 12 hours.   Is forgetful but not sure why.    Past Psychiatric Medication Trials:  Abilify SE,  fluoxetine 40 insomnia Wellbutrin 300,   Lexapro fatigue   Concerta 72, Adderall SE jittery  Xanax hs, trazodone,  Ambien hangover, Melatonin SE N  Son dep and OCD. Less effective Spravato tx.  Review of Systems:  Review of Systems  Cardiovascular:  Negative for palpitations.  Neurological:  Negative for dizziness, tremors and weakness.  Psychiatric/Behavioral:  Positive for decreased concentration and sleep disturbance. Negative for agitation, behavioral problems, confusion, dysphoric mood, hallucinations, self-injury and suicidal ideas. The patient is not nervous/anxious and is not hyperactive.     Medications: I have reviewed the patient's current medications.  Current Outpatient Medications  Medication Sig Dispense Refill   Acetylcysteine 600 MG CAPS Take 1 capsule (600 mg total) by mouth daily. 90 capsule 3   Ascorbic Acid (VITAMIN C PO) Take 1 tablet by mouth daily.     COVID-19 mRNA bivalent vaccine, Pfizer, (PFIZER COVID-19 VAC BIVALENT) injection Inject into the muscle. 0.3 mL 0   COVID-19 mRNA vaccine 2023-2024 (COMIRNATY) syringe Inject into the muscle. 0.3 mL 0   ESTRACE VAGINAL 0.1 MG/GM vaginal cream Place 1 Applicatorful vaginally as needed.     influenza vaccine adjuvanted (FLUAD QUADRIVALENT) 0.5 ML injection Inject into the muscle. 0.5 mL 0   NASONEX 50 MCG/ACT nasal spray PLACE 2 SPRAYS INTO THE NOSE DAILY AS NEEDED. 17 g 5   olmesartan (BENICAR) 20 MG tablet Take 20 mg by mouth daily.     rosuvastatin (CRESTOR) 10 MG tablet Take 1 tablet by mouth at bedtime.     Zoster Vaccine Adjuvanted [facility]) injection Inject into the muscle. (Patient taking differently: Inject into the muscle. Has had one injection) 0.5 mL 0   ALPRAZolam (XANAX) 1 MG tablet  TAKE 1 TABLET TO   ONE AND ONE-HALF TABLETS AT BEDTIME AS NEEDED FOR ANXIETY 135 tablet 1   FLUoxetine (PROZAC) 20 MG capsule Take 1 capsule (20 mg total) by mouth daily. 90 capsule 1   lithium carbonate 150 MG capsule TAKE 1 CAPSULE AT BEDTIME (Patient not taking: Reported on 05/11/2022) 90 capsule 3   methylphenidate (CONCERTA) 36 MG PO CR tablet Take 2 tablets (72 mg total) by mouth every morning. 60 tablet 0   [START ON 06/08/2022] methylphenidate (CONCERTA) 36 MG PO CR tablet Take 2 tablets (72 mg total) by mouth daily. 60 tablet 0   [START ON 07/06/2022] methylphenidate (CONCERTA) 36 MG PO CR tablet Take 2 tablets (72 mg total) by mouth every morning. 60 tablet 0   No current facility-administered medications for this visit.    Medication Side Effects: None  Allergies:  Allergies  Allergen Reactions   Demerol  [Meperidine] Nausea And Vomiting   Metoprolol     Nausea and inability to increase heart rate with cardiovascular exercise  Oxycodone-Acetaminophen Nausea Only   Sulfa Antibiotics Nausea And Vomiting    Past Medical History:  Diagnosis Date   Allergic rhinitis    seasonal   Anemia    Arthritis    GERD (gastroesophageal reflux disease)    Hypertension    Pain in shoulder    right side   SOB (shortness of breath) on exertion     Family History  Problem Relation Age of Onset   Stroke Mother 79   Dementia Mother    Stroke Father 54   Heart disease Brother 48       LAD stenting   Colon cancer Maternal Uncle    Diabetes Son        Type 1   Prostate cancer Maternal Uncle    Stroke Paternal Grandmother        mid 13s   Hypertension Paternal Grandmother     Social History   Socioeconomic History   Marital status: Married    Spouse name: Not on file   Number of children: Not on file   Years of education: Not on file   Highest education level: Not on file  Occupational History   Not on file  Tobacco Use   Smoking status: Former    Types: Cigarettes     Quit date: 04/24/1976    Years since quitting: 46.0   Smokeless tobacco: Never   Tobacco comments:    smoked [age], up to 1ppd  Vaping Use   Vaping Use: Never used  Substance and Sexual Activity   Alcohol use: Yes    Comment: wine-2-3 days per week    Drug use: No   Sexual activity: Not on file  Other Topics Concern   Not on file  Social History Narrative   Not on file   Social Determinants of Health   Financial Resource Strain: Not on file  Food Insecurity: Not on file  Transportation Needs: Not on file  Physical Activity: Not on file  Stress: Not on file  Social Connections: Not on file  Intimate Partner Violence: Not on file    Past Medical History, Surgical history, Social history, and Family history were reviewed and updated as appropriate.   M had dementia onset 87's.  Please see review of systems for further details on the patient's review from today.   Objective:   Physical Exam:  BP (!) 146/93   Pulse 92   Physical Exam Constitutional:      General: She is not in acute distress. Musculoskeletal:        General: No deformity.  Neurological:     Mental Status: She is alert and oriented to person, place, and time.     Coordination: Coordination normal.     Gait: Gait normal.  Psychiatric:        Attention and Perception: She is inattentive.        Mood and Affect: Mood is not anxious or depressed. Affect is not labile, angry or inappropriate.        Speech: Speech normal. Speech is not slurred.        Behavior: Behavior normal.        Thought Content: Thought content normal. Thought content is not paranoid or delusional. Thought content does not include homicidal or suicidal ideation.        Cognition and Memory: Cognition normal.        Judgment: Judgment normal.     Comments: Insight intact. No auditory or visual hallucinations. No delusions.  Lab Review:     Component Value Date/Time   NA 136 06/09/2020 1349   K 4.6 06/09/2020 1349   CL  101 06/09/2020 1349   CO2 29 06/09/2020 1349   GLUCOSE 105 (H) 06/09/2020 1349   BUN 19 06/09/2020 1349   CREATININE 0.85 06/09/2020 1349   CALCIUM 9.6 06/09/2020 1349   PROT 7.3 05/18/2014 1321   ALBUMIN 4.5 05/18/2014 1321   AST 25 05/18/2014 1321   ALT 18 05/18/2014 1321   ALKPHOS 45 05/18/2014 1321   BILITOT 0.5 05/18/2014 1321   GFRNONAA >60 06/09/2020 1349   GFRAA >90 10/07/2013 0840       Component Value Date/Time   WBC 7.9 06/09/2020 1349   RBC 3.80 (L) 06/09/2020 1349   HGB 12.0 06/09/2020 1349   HCT 36.2 06/09/2020 1349   PLT 301 06/09/2020 1349   MCV 95.3 06/09/2020 1349   MCH 31.6 06/09/2020 1349   MCHC 33.1 06/09/2020 1349   RDW 11.4 (L) 06/09/2020 1349   LYMPHSABS 2.4 05/18/2014 1321   MONOABS 0.4 05/18/2014 1321   EOSABS 0.1 05/18/2014 1321   BASOSABS 0.0 05/18/2014 1321    No results found for: "POCLITH", "LITHIUM"   No results found for: "PHENYTOIN", "PHENOBARB", "VALPROATE", "CBMZ"   .res Assessment: Plan:    Jessica [name] was seen today for follow-up, depression and adhd.  Diagnoses and all orders for this visit:  Major depressive disorder with single episode, in full remission ([place]) -     FLUoxetine (PROZAC) 20 MG capsule; Take 1 capsule (20 mg total) by mouth daily.  Attention deficit hyperactivity disorder (ADHD), predominantly inattentive type -     methylphenidate (CONCERTA) 36 MG PO CR tablet; Take 2 tablets (72 mg total) by mouth every morning. -     methylphenidate (CONCERTA) 36 MG PO CR tablet; Take 2 tablets (72 mg total) by mouth daily. -     methylphenidate (CONCERTA) 36 MG PO CR tablet; Take 2 tablets (72 mg total) by mouth every morning.  Insomnia due to mental condition -     ALPRAZolam (XANAX) 1 MG tablet; TAKE 1 TABLET TO   ONE AND ONE-HALF TABLETS AT BEDTIME AS NEEDED FOR ANXIETY   RO MCI  Greater than 50% of 30 min face to face time with patient was spent on counseling and coordination of care. We discussed  Overall mood is better  with the increase in fluoxetine to 40 mg.  She tolerated it well.  However cognition is much worse because she has been intermittent with 72 mg Concerta because of the shortage.  Continue NAC for mild cognitive complaints. Disc Dr. Olene Floss article on Lithium and memory DT mother's hx dementia.  No problems off the lithium DT elevation mildly in CR.  Level not in Epic. Continue NAC (N-acetylcysteine) 600 mg capsule 1 daily for memory and concentration. Disc recent availability problems.  Disc dealing with med shortages and strategies around this problem.  She has missed the Concerta for 3 months because she was told it was not available.  However other patients are getting it from other pharmacies.  We will switch to [facility].  This is likely to resolve after the new year. Continue Concerta 72 mg every morning because markedly worse ADD over the last 3 months without it.  Disc risk LT PPI on suppressing B12 and to keep an eye on this with PCP bc of cog concerns.  We discussed the short-term risks associated with benzodiazepines including sedation and increased fall risk among  others.  Discussed long-term side effect risk including dependence, potential withdrawal symptoms, and the potential eventual dose-related risk of dementia.  But recent studies from 2020 dispute this association between benzodiazepines and dementia risk. Newer studies in 2020 do not support an association with dementia. Continue Xanax 1 to 1-1/2 mg nightly for insomnia. Lowest dosage of Bz.  continue fluoxetine 20 mg daily helped residual depression.  Answered questions she had about new type of [place] for treatment resistant depression for the sake of her son.  Also discussed there is a variant of [place] that is FDA approved for OCD as well.  This appt was 30 mins.  Fu 6 mos  Lynder Parents MD, DFAPA Please see After Visit Summary for patient specific instructions.  No future appointments.    No orders of the defined types  were placed in this encounter.     -------------------------------

## 2022-05-12 DIAGNOSIS — M25552 Pain in left hip: Secondary | ICD-10-CM | POA: Diagnosis not present

## 2022-06-08 ENCOUNTER — Other Ambulatory Visit: Payer: Self-pay | Admitting: Psychiatry

## 2022-06-08 DIAGNOSIS — F325 Major depressive disorder, single episode, in full remission: Secondary | ICD-10-CM

## 2022-06-15 DIAGNOSIS — H524 Presbyopia: Secondary | ICD-10-CM | POA: Diagnosis not present

## 2022-06-15 DIAGNOSIS — H2513 Age-related nuclear cataract, bilateral: Secondary | ICD-10-CM | POA: Diagnosis not present

## 2022-06-15 DIAGNOSIS — H5213 Myopia, bilateral: Secondary | ICD-10-CM | POA: Diagnosis not present

## 2022-06-15 DIAGNOSIS — H52223 Regular astigmatism, bilateral: Secondary | ICD-10-CM | POA: Diagnosis not present

## 2022-06-22 DIAGNOSIS — R5383 Other fatigue: Secondary | ICD-10-CM | POA: Diagnosis not present

## 2022-06-22 DIAGNOSIS — G43909 Migraine, unspecified, not intractable, without status migrainosus: Secondary | ICD-10-CM | POA: Diagnosis not present

## 2022-06-22 DIAGNOSIS — R058 Other specified cough: Secondary | ICD-10-CM | POA: Diagnosis not present

## 2022-06-22 DIAGNOSIS — M791 Myalgia, unspecified site: Secondary | ICD-10-CM | POA: Diagnosis not present

## 2022-06-22 DIAGNOSIS — J069 Acute upper respiratory infection, unspecified: Secondary | ICD-10-CM | POA: Diagnosis not present

## 2022-06-22 DIAGNOSIS — Z1152 Encounter for screening for COVID-19: Secondary | ICD-10-CM | POA: Diagnosis not present

## 2022-06-22 DIAGNOSIS — R0981 Nasal congestion: Secondary | ICD-10-CM | POA: Diagnosis not present

## 2022-07-05 DIAGNOSIS — J029 Acute pharyngitis, unspecified: Secondary | ICD-10-CM | POA: Diagnosis not present

## 2022-07-05 DIAGNOSIS — J069 Acute upper respiratory infection, unspecified: Secondary | ICD-10-CM | POA: Diagnosis not present

## 2022-07-05 DIAGNOSIS — R051 Acute cough: Secondary | ICD-10-CM | POA: Diagnosis not present

## 2022-07-05 DIAGNOSIS — J302 Other seasonal allergic rhinitis: Secondary | ICD-10-CM | POA: Diagnosis not present

## 2022-07-18 ENCOUNTER — Telehealth: Payer: Self-pay | Admitting: Psychiatry

## 2022-07-18 NOTE — Telephone Encounter (Signed)
Jessica [name] lvm inquiring about her Methylphenidate. She stated per the [facility] on [address] it is on hold. Please advise. Contact # [phone]  Appointment 11/09/22

## 2022-07-18 NOTE — Telephone Encounter (Signed)
Called pharmacy and there was not a problem, said they would fill it today. Patient notified.

## 2022-08-04 DIAGNOSIS — R7989 Other specified abnormal findings of blood chemistry: Secondary | ICD-10-CM | POA: Diagnosis not present

## 2022-08-04 DIAGNOSIS — K219 Gastro-esophageal reflux disease without esophagitis: Secondary | ICD-10-CM | POA: Diagnosis not present

## 2022-08-04 DIAGNOSIS — D649 Anemia, unspecified: Secondary | ICD-10-CM | POA: Diagnosis not present

## 2022-08-04 DIAGNOSIS — I10 Essential (primary) hypertension: Secondary | ICD-10-CM | POA: Diagnosis not present

## 2022-08-04 DIAGNOSIS — E785 Hyperlipidemia, unspecified: Secondary | ICD-10-CM | POA: Diagnosis not present

## 2022-08-11 ENCOUNTER — Other Ambulatory Visit: Payer: Self-pay | Admitting: Internal Medicine

## 2022-08-11 DIAGNOSIS — N3941 Urge incontinence: Secondary | ICD-10-CM | POA: Diagnosis not present

## 2022-08-11 DIAGNOSIS — E785 Hyperlipidemia, unspecified: Secondary | ICD-10-CM | POA: Diagnosis not present

## 2022-08-11 DIAGNOSIS — M810 Age-related osteoporosis without current pathological fracture: Secondary | ICD-10-CM | POA: Diagnosis not present

## 2022-08-11 DIAGNOSIS — D649 Anemia, unspecified: Secondary | ICD-10-CM | POA: Diagnosis not present

## 2022-08-11 DIAGNOSIS — M533 Sacrococcygeal disorders, not elsewhere classified: Secondary | ICD-10-CM

## 2022-08-11 DIAGNOSIS — R82998 Other abnormal findings in urine: Secondary | ICD-10-CM | POA: Diagnosis not present

## 2022-08-11 DIAGNOSIS — Z23 Encounter for immunization: Secondary | ICD-10-CM | POA: Diagnosis not present

## 2022-08-11 DIAGNOSIS — Z1339 Encounter for screening examination for other mental health and behavioral disorders: Secondary | ICD-10-CM | POA: Diagnosis not present

## 2022-08-11 DIAGNOSIS — N1831 Chronic kidney disease, stage 3a: Secondary | ICD-10-CM | POA: Diagnosis not present

## 2022-08-11 DIAGNOSIS — I129 Hypertensive chronic kidney disease with stage 1 through stage 4 chronic kidney disease, or unspecified chronic kidney disease: Secondary | ICD-10-CM | POA: Diagnosis not present

## 2022-08-11 DIAGNOSIS — K219 Gastro-esophageal reflux disease without esophagitis: Secondary | ICD-10-CM | POA: Diagnosis not present

## 2022-08-11 DIAGNOSIS — Z0000 Encounter for general adult medical examination without abnormal findings: Secondary | ICD-10-CM | POA: Diagnosis not present

## 2022-08-11 DIAGNOSIS — Z1331 Encounter for screening for depression: Secondary | ICD-10-CM | POA: Diagnosis not present

## 2022-09-01 DIAGNOSIS — M25551 Pain in right hip: Secondary | ICD-10-CM | POA: Diagnosis not present

## 2022-09-01 DIAGNOSIS — M4316 Spondylolisthesis, lumbar region: Secondary | ICD-10-CM | POA: Diagnosis not present

## 2022-09-01 DIAGNOSIS — R531 Weakness: Secondary | ICD-10-CM | POA: Diagnosis not present

## 2022-09-03 ENCOUNTER — Ambulatory Visit
Admission: RE | Admit: 2022-09-03 | Discharge: 2022-09-03 | Disposition: A | Discharge status: Home / Self Care | Payer: Medicare Other | Source: Ambulatory Visit | Attending: Internal Medicine | Admitting: Internal Medicine

## 2022-09-03 DIAGNOSIS — R531 Weakness: Secondary | ICD-10-CM | POA: Diagnosis not present

## 2022-09-03 DIAGNOSIS — M533 Sacrococcygeal disorders, not elsewhere classified: Secondary | ICD-10-CM

## 2022-09-03 DIAGNOSIS — M5431 Sciatica, right side: Secondary | ICD-10-CM | POA: Diagnosis not present

## 2022-09-11 DIAGNOSIS — H25013 Cortical age-related cataract, bilateral: Secondary | ICD-10-CM | POA: Diagnosis not present

## 2022-09-11 DIAGNOSIS — H2513 Age-related nuclear cataract, bilateral: Secondary | ICD-10-CM | POA: Diagnosis not present

## 2022-09-11 DIAGNOSIS — H25043 Posterior subcapsular polar age-related cataract, bilateral: Secondary | ICD-10-CM | POA: Diagnosis not present

## 2022-09-11 DIAGNOSIS — H2512 Age-related nuclear cataract, left eye: Secondary | ICD-10-CM | POA: Diagnosis not present

## 2022-09-11 DIAGNOSIS — M25551 Pain in right hip: Secondary | ICD-10-CM | POA: Diagnosis not present

## 2022-09-11 DIAGNOSIS — I10 Essential (primary) hypertension: Secondary | ICD-10-CM | POA: Diagnosis not present

## 2022-09-22 DIAGNOSIS — H2511 Age-related nuclear cataract, right eye: Secondary | ICD-10-CM | POA: Diagnosis not present

## 2022-09-22 DIAGNOSIS — H2512 Age-related nuclear cataract, left eye: Secondary | ICD-10-CM | POA: Diagnosis not present

## 2022-09-26 ENCOUNTER — Ambulatory Visit (AMBULATORY_SURGERY_CENTER): Payer: Medicare Other

## 2022-09-26 VITALS — Ht 64.5 in | Wt 106.0 lb

## 2022-09-26 DIAGNOSIS — Z1211 Encounter for screening for malignant neoplasm of colon: Secondary | ICD-10-CM

## 2022-09-26 MED ORDER — PEG 3350-KCL-NA BICARB-NACL 420 G PO SOLR
4000.0000 mL | Freq: Once | ORAL | 0 refills | Status: AC
Start: 2022-09-26 — End: 2022-09-26

## 2022-09-26 NOTE — Progress Notes (Signed)
No egg or soy allergy known to patient  No issues known to pt with past sedation with any surgeries or procedures Patient denies ever being told they had issues or difficulty with intubation  No FH of Malignant Hyperthermia Pt is not on diet pills Pt is not on  home 02  Pt is not on blood thinners  Pt denies issues with constipation  No A fib or A flutter Have any cardiac testing pending--no  Pt is ambulatory   Patient's chart reviewed by Cathlyn Parsons CNRA prior to previsit and patient appropriate for the [facility].  Previsit completed and red dot placed by patient's name on their procedure day (on provider's schedule).     PV completed with patient. Prep instructions reviewed and sent via mychart and to home address.

## 2022-10-05 DIAGNOSIS — M25551 Pain in right hip: Secondary | ICD-10-CM | POA: Diagnosis not present

## 2022-10-10 DIAGNOSIS — M25551 Pain in right hip: Secondary | ICD-10-CM | POA: Diagnosis not present

## 2022-10-10 DIAGNOSIS — M25552 Pain in left hip: Secondary | ICD-10-CM | POA: Diagnosis not present

## 2022-10-11 ENCOUNTER — Encounter: Payer: Self-pay | Admitting: Internal Medicine

## 2022-10-11 ENCOUNTER — Ambulatory Visit (AMBULATORY_SURGERY_CENTER): Payer: Medicare Other | Admitting: Internal Medicine

## 2022-10-11 VITALS — BP 159/86 | HR 64 | Temp 98.6°F | Resp 16 | Ht 64.5 in | Wt 106.0 lb

## 2022-10-11 DIAGNOSIS — Z1211 Encounter for screening for malignant neoplasm of colon: Secondary | ICD-10-CM | POA: Diagnosis not present

## 2022-10-11 DIAGNOSIS — D122 Benign neoplasm of ascending colon: Secondary | ICD-10-CM

## 2022-10-11 DIAGNOSIS — I10 Essential (primary) hypertension: Secondary | ICD-10-CM | POA: Diagnosis not present

## 2022-10-11 MED ORDER — SODIUM CHLORIDE 0.9 % IV SOLN
500.0000 mL | Freq: Once | INTRAVENOUS | Status: DC
Start: 2022-10-11 — End: 2022-10-11

## 2022-10-11 NOTE — Progress Notes (Signed)
Called to room to assist during endoscopic procedure.  Patient ID and intended procedure confirmed with present staff. Received instructions for my participation in the procedure from the performing physician.  

## 2022-10-11 NOTE — Progress Notes (Signed)
Pt's states no medical or surgical changes since previsit or office visit. 

## 2022-10-11 NOTE — Patient Instructions (Addendum)
Recommendation:           - Repeat colonoscopy is not recommended for                            surveillance.                           - Patient has a contact number available for                            emergencies. The signs and symptoms of potential                            delayed complications were discussed with the                            patient. Return to normal activities tomorrow.                            Written discharge instructions were provided to the                            patient.                           - Resume previous diet.                           - Continue present medications.                           - Await pathology results.  Handout on polyps given.  YOU HAD AN ENDOSCOPIC PROCEDURE TODAY AT THE [facility]:   Refer to the procedure report that was given to you for any specific questions about what was found during the examination.  If the procedure report does not answer your questions, please call your gastroenterologist to clarify.  If you requested that your care partner not be given the details of your procedure findings, then the procedure report has been included in a sealed envelope for you to review at your convenience later.  YOU SHOULD EXPECT: Some feelings of bloating in the abdomen. Passage of more gas than usual.  Walking can help get rid of the air that was put into your GI tract during the procedure and reduce the bloating. If you had a lower endoscopy (such as a colonoscopy or flexible sigmoidoscopy) you may notice spotting of blood in your stool or on the toilet paper. If you underwent a bowel prep for your procedure, you may not have a normal bowel movement for a few days.  Please Note:  You might notice some irritation and congestion in your nose or some drainage.  This is from the oxygen used during your procedure.  There is no need for concern and it should clear up in a day or so.  SYMPTOMS TO REPORT  IMMEDIATELY:  Following lower endoscopy (colonoscopy or flexible sigmoidoscopy):  Excessive amounts of blood in the stool  Significant tenderness or worsening of abdominal pains  Swelling of the abdomen that is new, acute  Fever of 100F or  higher  For urgent or emergent issues, a gastroenterologist can be reached at any hour by calling [phone]. Do not use MyChart messaging for urgent concerns.    DIET:  We do recommend a small meal at first, but then you may proceed to your regular diet.  Drink plenty of fluids but you should avoid alcoholic beverages for 24 hours.  ACTIVITY:  You should plan to take it easy for the rest of today and you should NOT DRIVE or use heavy machinery until tomorrow (because of the sedation medicines used during the test).    FOLLOW UP: Our staff will call the number listed on your records the next business day following your procedure.  We will call around 7:15- 8:00 am to check on you and address any questions or concerns that you may have regarding the information given to you following your procedure. If we do not reach you, we will leave a message.     If any biopsies were taken you will be contacted by phone or by letter within the next 1-3 weeks.  Please call us at [phone] if you have not heard about the biopsies in 3 weeks.    SIGNATURES/CONFIDENTIALITY: You and/or your care partner have signed paperwork which will be entered into your electronic medical record.  These signatures attest to the fact that that the information above on your After Visit Summary has been reviewed and is understood.  Full responsibility of the confidentiality of this discharge information lies with you and/or your care-partner.

## 2022-10-11 NOTE — Op Note (Signed)
[facility] Patient Name: Aoibheann Heesch Procedure Date: 10/11/2022 3:08 PM MRN: [id number] Endoscopist: Wilhemina Bonito. Marina Goodell , MD, [id number] Age: 72 Referring MD:  Date of Birth: [birth year] Gender: Female Account #: [id number] Procedure:                Colonoscopy with cold snare polypectomy x 1 Indications:              Screening for colorectal malignant neoplasm.                            Previous examinations (Dr. Juanda Chance) 2003 and 2014                            were negative for neoplasia Medicines:                Monitored Anesthesia Care Procedure:                Pre-Anesthesia Assessment:                           - Prior to the procedure, a History and Physical                            was performed, and patient medications and                            allergies were reviewed. [name] tolerance of                            previous anesthesia was also reviewed. The risks                            and benefits of the procedure and the sedation                            options and risks were discussed with the patient.                            All questions were answered, and informed consent                            was obtained. Prior Anticoagulants: The patient has                            taken no anticoagulant or antiplatelet agents. ASA                            Grade Assessment: II - A patient with mild systemic                            disease. After reviewing the risks and benefits,                            the patient was deemed in satisfactory condition to  undergo the procedure.                           After obtaining informed consent, the colonoscope                            was passed under direct vision. Throughout the                            procedure, the patient's blood pressure, pulse, and                            oxygen saturations were monitored continuously. The                            CF  [id number] #[id number] was introduced through the anus                            and advanced to the the cecum, identified by                            appendiceal orifice and ileocecal valve. The                            ileocecal valve, appendiceal orifice, and rectum                            were photographed. The quality of the bowel                            preparation was good. The colonoscopy was performed                            without difficulty. The patient tolerated the                            procedure well. The bowel preparation used was                            SUPREP via split dose instruction. Scope In: 3:15:16 PM Scope Out: 3:35:34 PM Scope Withdrawal Time: 0 hours 14 minutes 19 seconds  Total Procedure Duration: 0 hours 20 minutes 18 seconds  Findings:                 A 3 mm polyp was found in the ascending colon. The                            polyp was sessile. The polyp was removed with a                            cold snare. Resection and retrieval were complete.                           Many diverticula were found in the entire colon.  The exam was otherwise without abnormality on                            direct and retroflexion views. Complications:            No immediate complications. Estimated blood loss:                            None. Estimated Blood Loss:     Estimated blood loss: none. Impression:               - One 3 mm polyp in the ascending colon, removed                            with a cold snare. Resected and retrieved.                           - Diverticulosis in the entire examined colon.                           - The examination was otherwise normal on direct                            and retroflexion views. Recommendation:           - Repeat colonoscopy is not recommended for                            surveillance.                           - Patient has a contact number available for                             emergencies. The signs and symptoms of potential                            delayed complications were discussed with the                            patient. Return to normal activities tomorrow.                            Written discharge instructions were provided to the                            patient.                           - Resume previous diet.                           - Continue present medications.                           - Await pathology results. Wilhemina Bonito. Marina Goodell, MD 10/11/2022 3:41:15 PM This report has been signed electronically.

## 2022-10-11 NOTE — Progress Notes (Signed)
HISTORY OF PRESENT ILLNESS:  Jessica [name] is a 72 y.o. female who presents today for screening colonoscopy.  Previous examinations 2003 and 2014 were negative for neoplasia  REVIEW OF SYSTEMS:  All non-GI ROS negative except for  Past Medical History:  Diagnosis Date   Allergic rhinitis    seasonal   Anemia    Arthritis    GERD (gastroesophageal reflux disease)    Hypertension    Pain in shoulder    right side   SOB (shortness of breath) on exertion     Past Surgical History:  Procedure Laterality Date   COLONOSCOPY  2003   Dr Juanda Chance, due  01/2012   REVERSE SHOULDER ARTHROPLASTY Left 06/17/2020   Procedure: REVERSE SHOULDER ARTHROPLASTY;  [name], MD;  [name];  Service: Orthopedics;  Laterality: Left;    SHOULDER ARTHROSCOPY WITH OPEN ROTATOR CUFF REPAIR AND DISTAL CLAVICLE ACROMINECTOMY Right 10/09/2013   Procedure: RIGHT SHOULDER ARTHROSCOPY, SUBACROMIAL DECOMPRESSION, DISTAL CLAVICLE RESECTION, ROTATOR CUFF REPAIR  AND POSSIBLE OPEN ROTATOR CUFF REPAIR ;  [name], MD;  [name];  Service: Orthopedics;  Laterality: Right;   TUBAL LIGATION      Social History [name]  reports that she quit smoking about 46 years ago. Her smoking use included cigarettes. She has never used smokeless tobacco. She reports current alcohol use. She reports that she does not use drugs.  family history includes Colon cancer in her maternal uncle; Dementia in her mother; Diabetes in her son; Heart disease (age of onset: 54) in her brother; Hypertension in her paternal grandmother; Prostate cancer in her maternal uncle; Stroke in her paternal grandmother; Stroke (age of onset: 20) in her father; Stroke (age of onset: 41) in her mother.  Allergies  Allergen Reactions   Demerol  [Meperidine] Nausea And Vomiting   Metoprolol     Nausea and inability to increase heart rate with cardiovascular exercise   Oxycodone-Acetaminophen Nausea Only   Sulfa  Antibiotics Nausea And Vomiting       PHYSICAL EXAMINATION: Vital signs: BP (!) 153/101   Pulse 80   Temp 98.6 F (37 C) (Skin)   Ht 5' 4.5" (1.638 m)   Wt 106 lb (48.1 kg)   SpO2 98%   BMI 17.91 kg/m  General: Well-developed, well-nourished, no acute distress HEENT: Sclerae are anicteric, conjunctiva pink. Oral mucosa intact Lungs: Clear Heart: Regular Abdomen: soft, nontender, nondistended, no obvious ascites, no peritoneal signs, normal bowel sounds. No organomegaly. Extremities: No edema Psychiatric: alert and oriented x3. Cooperative     ASSESSMENT:   Colon cancer screening  PLAN:  Screening colonoscopy

## 2022-10-12 ENCOUNTER — Telehealth: Payer: Self-pay | Admitting: *Deleted

## 2022-10-12 NOTE — Telephone Encounter (Signed)
Post procedure follow up phone call. No answer at number given.  Left message on voicemail.  

## 2022-10-16 ENCOUNTER — Encounter: Payer: Self-pay | Admitting: Internal Medicine

## 2022-10-16 ENCOUNTER — Other Ambulatory Visit: Payer: Self-pay | Admitting: Psychiatry

## 2022-10-16 ENCOUNTER — Telehealth: Payer: Self-pay | Admitting: Psychiatry

## 2022-10-16 DIAGNOSIS — F900 Attention-deficit hyperactivity disorder, predominantly inattentive type: Secondary | ICD-10-CM

## 2022-10-16 MED ORDER — METHYLPHENIDATE HCL ER (OSM) 36 MG PO TBCR
72.0000 mg | EXTENDED_RELEASE_TABLET | Freq: Every day | ORAL | 0 refills | Status: DC
Start: 2022-10-16 — End: 2022-12-21

## 2022-10-16 NOTE — Telephone Encounter (Signed)
Pt LVM  at 12:36p for refill of Methylphenidate 36mg  to   [id number] - [place], [place] - [address] AT [facility] [address] [place] [address] Phone: [phone]  Fax: [phone]   Next appt 7/18

## 2022-10-17 DIAGNOSIS — M25551 Pain in right hip: Secondary | ICD-10-CM | POA: Diagnosis not present

## 2022-10-17 DIAGNOSIS — M25552 Pain in left hip: Secondary | ICD-10-CM | POA: Diagnosis not present

## 2022-10-19 DIAGNOSIS — Z681 Body mass index (BMI) 19 or less, adult: Secondary | ICD-10-CM | POA: Diagnosis not present

## 2022-10-19 DIAGNOSIS — M5416 Radiculopathy, lumbar region: Secondary | ICD-10-CM | POA: Diagnosis not present

## 2022-10-23 DIAGNOSIS — H2511 Age-related nuclear cataract, right eye: Secondary | ICD-10-CM | POA: Diagnosis not present

## 2022-11-06 DIAGNOSIS — M5416 Radiculopathy, lumbar region: Secondary | ICD-10-CM | POA: Diagnosis not present

## 2022-11-09 ENCOUNTER — Ambulatory Visit: Payer: Medicare Other | Admitting: Psychiatry

## 2022-11-15 DIAGNOSIS — M5416 Radiculopathy, lumbar region: Secondary | ICD-10-CM | POA: Diagnosis not present

## 2022-11-16 DIAGNOSIS — M5415 Radiculopathy, thoracolumbar region: Secondary | ICD-10-CM | POA: Diagnosis not present

## 2022-11-16 DIAGNOSIS — M4316 Spondylolisthesis, lumbar region: Secondary | ICD-10-CM | POA: Diagnosis not present

## 2022-11-16 DIAGNOSIS — Z681 Body mass index (BMI) 19 or less, adult: Secondary | ICD-10-CM | POA: Diagnosis not present

## 2022-11-27 DIAGNOSIS — M5416 Radiculopathy, lumbar region: Secondary | ICD-10-CM | POA: Diagnosis not present

## 2022-12-04 DIAGNOSIS — M5416 Radiculopathy, lumbar region: Secondary | ICD-10-CM | POA: Diagnosis not present

## 2022-12-11 DIAGNOSIS — M5416 Radiculopathy, lumbar region: Secondary | ICD-10-CM | POA: Diagnosis not present

## 2022-12-11 DIAGNOSIS — M545 Low back pain, unspecified: Secondary | ICD-10-CM | POA: Diagnosis not present

## 2022-12-18 DIAGNOSIS — M5416 Radiculopathy, lumbar region: Secondary | ICD-10-CM | POA: Diagnosis not present

## 2022-12-19 DIAGNOSIS — M5416 Radiculopathy, lumbar region: Secondary | ICD-10-CM | POA: Diagnosis not present

## 2022-12-21 ENCOUNTER — Ambulatory Visit (INDEPENDENT_AMBULATORY_CARE_PROVIDER_SITE_OTHER): Payer: Medicare Other | Admitting: Psychiatry

## 2022-12-21 ENCOUNTER — Encounter: Payer: Self-pay | Admitting: Psychiatry

## 2022-12-21 DIAGNOSIS — F325 Major depressive disorder, single episode, in full remission: Secondary | ICD-10-CM

## 2022-12-21 DIAGNOSIS — F900 Attention-deficit hyperactivity disorder, predominantly inattentive type: Secondary | ICD-10-CM | POA: Diagnosis not present

## 2022-12-21 DIAGNOSIS — M5386 Other specified dorsopathies, lumbar region: Secondary | ICD-10-CM | POA: Diagnosis not present

## 2022-12-21 DIAGNOSIS — F5105 Insomnia due to other mental disorder: Secondary | ICD-10-CM | POA: Diagnosis not present

## 2022-12-21 MED ORDER — DULOXETINE HCL 30 MG PO CPEP
ORAL_CAPSULE | ORAL | 0 refills | Status: DC
Start: 2022-12-21 — End: 2023-01-15

## 2022-12-21 MED ORDER — ALPRAZOLAM 1 MG PO TABS: ORAL_TABLET | ORAL | 1 refills | Status: DC

## 2022-12-21 MED ORDER — METHYLPHENIDATE HCL ER (OSM) 36 MG PO TBCR
72.0000 mg | EXTENDED_RELEASE_TABLET | Freq: Every day | ORAL | 0 refills | Status: DC
Start: 2022-12-21 — End: 2023-06-07

## 2022-12-21 NOTE — Progress Notes (Signed)
Jessica [name] [id number] 04-14-1951 72 y.o.  Subjective:   Patient ID:  Jessica [name] is a 72 y.o. (DOB 08/23/1950) female.  Chief Complaint:  Chief Complaint  Patient presents with   Follow-up   Depression   Sleeping Problem   ADD    HPI CHAU MCKEARNEY presents to the office today for follow-up of ADD, anxeity, sleep and depression.  visit June 2020.  She was having some cognitive complaints and it was suggested that she add lithium 150 mg daily for his neuro protective effect as well as N-acetylcysteine 600 mg daily for cognitive benefit.  No other meds were changed.  She called back September 11 stating Wellbutrin was making her dizzy and shaky and she was going to reduce the dose to 150 mg and potentially stop it.  She remained on fluoxetine 20 mg, Concerta 36 mg and alprazolam 1 to 1-1/2 mg nightly as needed insomnia  December 2020 appointment with the following noted: Good overall except son still at home and causing problems. No depression relapse off Wellbutrin.  Stopped it DT tremor which resolved.  Overall satisfied with meds.  Great overall. Tolerating.   No meds were changed.  She continued Concerta 72 mg every morning, fluoxetine 20, lithium 150, alprazolam 1 mg 1 to 1-1/2 tablets nightly for sleep.  10/06/2019 appointment with the following noted:  lithium stopped DT elevated Cr and BUN. Doing OK overall. Continues other meds.  Sleep is OK with Xanax 1 mg hs usually..  No SE. Asked questions about Ketamine Y& TMS for  depression for son.   Son has tried and failed multiple meds Plan no med changes  04/06/20 appt with following noted: Needs Xanax lately regularly for sleep.  Son is going through a lot. Doing OK with meds: Lithium 150, prozac 20, Concerta 72.  NAC.   No SE.    Sees cognitive benefit from lithium and NAC. Good health except indigestion.  Son borderline.   Plan: No med changes  10/19/2020 appointment with the following noted: Still taking Concerta and  fluoxetine 20 Just had shoulder replacement.  Not doing much for enjoyment and son still living at times and doing nothing. Son tried Development worker, international aid and [employer] without help. No SE Prozac.    Patient reports stable mood and denies depressed or irritable moods.  Patient denies any recent difficulty with anxiety.  Patient denies difficulty with sleep initiation or maintenance. Denies appetite disturbance.  Patient reports that energy and motivation have been good.  Patient denies any difficulty with concentration.  Patient denies any suicidal ideation. Needs Xanax for sleep   Usually 1mg  hs.  03/28/21 appt noted: Has not been able to get Concerta for 3 mos bc [facility] said they didn't have it.  Much more scattered without it. Still on fluoxetine 40 mg , Xanax 1-1.5 mg HS,  Typically sleeps well. Feels better on increased Prozac. Plan: Restart Concerta 72 mg every morning because markedly worse ADD over the last 3 months without it. Continue fluoxetine 40 mg and alprazolam 1 to 1-1/2 mg nightly  11/08/2021 appointment with the following noted: Doing ok. Rattled with son still home with OCD and depression.  He's doing ketamine tx. She's satisfied with meds and does better with Concerta 72 but can't always get it. NoSE Sleep fine with Xanax HS usually less than 1.5 mg . Doing ok with less prozac 20 mg now Patient reports stable mood and denies depressed or irritable moods.  Patient denies any recent difficulty with  anxiety.  Patient denies difficulty with sleep initiation or maintenance. Denies appetite disturbance.  Patient reports that energy and motivation have been good.  Patient has worse difficulty with concentration.  Patient denies any suicidal ideation. No med changes  05/11/22 appt noted: Continues meds. Had Covid couple of mos ago. Not taking Prozac and was very irritable.  Better on it. Good.  Pleased with meds.  No SE.   No concerns with Concerta or meds. Usally alprazolam 1 mg HS.    Melatonin SE N  8/29/202 appointment noted: Current psych meds: NAC 600 daily, alprazolam 1 mg tablet 1-1/2 tablets nightly, fluoxetine 20 mg daily, concerta 72 mg AM Satisfied with the Concerta for ADD. Duration about 12 hours.   Is forgetful but not sure why.   Sciatica on R.  Shot didn't help.  Sx worse over the last year.  Affected  mood.  However handling it as well as anyone. Needs Xanax to sleep abut it is working. Has been doing PT which did help until shot made it worse again.   Past Psychiatric Medication Trials:  Abilify SE,  fluoxetine 40 insomnia Wellbutrin 300,   Lexapro fatigue   Concerta 72, Adderall SE jittery  Xanax hs, trazodone,  Ambien hangover, Melatonin SE N  Son dep and OCD. Less effective Spravato tx.  Review of Systems:  Review of Systems  Cardiovascular:  Negative for palpitations.  Musculoskeletal:  Positive for back pain.  Neurological:  Negative for dizziness, tremors and weakness.  Psychiatric/Behavioral:  Positive for decreased concentration and sleep disturbance. Negative for agitation, behavioral problems, confusion, dysphoric mood, hallucinations, self-injury and suicidal ideas. The patient is not nervous/anxious and is not hyperactive.     Medications: I have reviewed the patient's current medications.  Current Outpatient Medications  Medication Sig Dispense Refill   Acetylcysteine 600 MG CAPS Take 1 capsule (600 mg total) by mouth daily. 90 capsule 3   ALPRAZolam (XANAX) 1 MG tablet TAKE 1 TABLET TO   ONE AND ONE-HALF TABLETS AT BEDTIME AS NEEDED FOR ANXIETY 135 tablet 1   Ascorbic Acid (VITAMIN C PO) Take 1 tablet by mouth daily.     BESIVANCE 0.6 % SUSP Place 1 drop into the left eye 3 (three) times daily.     Difluprednate 0.05 % EMUL Place 1 drop into the left eye in the morning, at noon, in the evening, and at bedtime.     DULoxetine (CYMBALTA) 30 MG capsule 1 daily for 1 week then 2 daily for 2 weeks then 3 daily 90 capsule 0    ESTRACE VAGINAL 0.1 MG/GM vaginal cream Place 1 Applicatorful vaginally as needed.     FLUoxetine (PROZAC) 20 MG capsule Take 1 capsule (20 mg total) by mouth daily. 90 capsule 1   fluticasone (FLONASE) 50 MCG/ACT nasal spray fluticasone propionate 50 mcg/actuation nasal spray,suspension     influenza vaccine adjuvanted (FLUAD QUADRIVALENT) 0.5 ML injection Inject into the muscle. 0.5 mL 0   ketorolac (ACULAR) 0.5 % ophthalmic solution Place 1 drop into the left eye 4 (four) times daily.     meloxicam (MOBIC) 15 MG tablet Take 15 mg by mouth daily.     methylphenidate (CONCERTA) 36 MG PO CR tablet Take 2 tablets (72 mg total) by mouth daily. 60 tablet 0   NASONEX 50 MCG/ACT nasal spray PLACE 2 SPRAYS INTO THE NOSE DAILY AS NEEDED. 17 g 5   olmesartan (BENICAR) 20 MG tablet Take 20 mg by mouth daily.     omeprazole (  PRILOSEC) 20 MG capsule Take 20 mg by mouth daily as needed.     rosuvastatin (CRESTOR) 10 MG tablet Take 1 tablet by mouth at bedtime.     Zoster Vaccine Adjuvanted [facility]) injection Inject into the muscle. (Patient taking differently: Inject into the muscle. Has had one injection) 0.5 mL 0   No current facility-administered medications for this visit.    Medication Side Effects: None  Allergies:  Allergies  Allergen Reactions   Demerol  [Meperidine] Nausea And Vomiting   Metoprolol     Nausea and inability to increase heart rate with cardiovascular exercise   Oxycodone-Acetaminophen Nausea Only   Sulfa Antibiotics Nausea And Vomiting    Past Medical History:  Diagnosis Date   Allergic rhinitis    seasonal   Anemia    Arthritis    GERD (gastroesophageal reflux disease)    Hypertension    Pain in shoulder    right side   SOB (shortness of breath) on exertion     Family History  Problem Relation Age of Onset   Stroke Mother 78   Dementia Mother    Stroke Father 78   Heart disease Brother 85       LAD stenting   Colon cancer Maternal Uncle    Diabetes Son         Type 1   Prostate cancer Maternal Uncle    Stroke Paternal Grandmother        mid 8s   Hypertension Paternal Grandmother     Social History   Socioeconomic History   Marital status: Married    Spouse name: Not on file   Number of children: Not on file   Years of education: Not on file   Highest education level: Not on file  Occupational History   Not on file  Tobacco Use   Smoking status: Former    Current packs/day: 0.00    Types: Cigarettes    Quit date: 04/24/1976    Years since quitting: 46.6   Smokeless tobacco: Never   Tobacco comments:    smoked [age], up to 1ppd  Vaping Use   Vaping status: Never Used  Substance and Sexual Activity   Alcohol use: Yes    Comment: wine-2-3 days per week    Drug use: No   Sexual activity: Not on file  Other Topics Concern   Not on file  Social History Narrative   Not on file   Social Determinants of Health   Financial Resource Strain: Not on file  Food Insecurity: Not on file  Transportation Needs: Not on file  Physical Activity: Not on file  Stress: Not on file  Social Connections: Not on file  Intimate Partner Violence: Not on file    Past Medical History, Surgical history, Social history, and Family history were reviewed and updated as appropriate.   M had dementia onset 6's.  Please see review of systems for further details on the patient's review from today.   Objective:   Physical Exam:  There were no vitals taken for this visit.  Physical Exam Constitutional:      General: She is not in acute distress. Musculoskeletal:        General: No deformity.  Neurological:     Mental Status: She is alert and oriented to person, place, and time.     Coordination: Coordination normal.     Gait: Gait normal.  Psychiatric:        Attention and Perception: She is inattentive.  Mood and Affect: Mood is not anxious or depressed. Affect is not labile or inappropriate.        Speech: Speech normal. Speech  is not slurred.        Behavior: Behavior normal.        Thought Content: Thought content normal. Thought content is not paranoid or delusional. Thought content does not include homicidal or suicidal ideation.        Cognition and Memory: Cognition normal.        Judgment: Judgment normal.     Comments: Insight intact. No auditory or visual hallucinations. No delusions.      Lab Review:     Component Value Date/Time   NA 136 06/09/2020 1349   K 4.6 06/09/2020 1349   CL 101 06/09/2020 1349   CO2 29 06/09/2020 1349   GLUCOSE 105 (H) 06/09/2020 1349   BUN 19 06/09/2020 1349   CREATININE 0.85 06/09/2020 1349   CALCIUM 9.6 06/09/2020 1349   PROT 7.3 05/18/2014 1321   ALBUMIN 4.5 05/18/2014 1321   AST 25 05/18/2014 1321   ALT 18 05/18/2014 1321   ALKPHOS 45 05/18/2014 1321   BILITOT 0.5 05/18/2014 1321   GFRNONAA >60 06/09/2020 1349   GFRAA >90 10/07/2013 0840       Component Value Date/Time   WBC 7.9 06/09/2020 1349   RBC 3.80 (L) 06/09/2020 1349   HGB 12.0 06/09/2020 1349   HCT 36.2 06/09/2020 1349   PLT 301 06/09/2020 1349   MCV 95.3 06/09/2020 1349   MCH 31.6 06/09/2020 1349   MCHC 33.1 06/09/2020 1349   RDW 11.4 (L) 06/09/2020 1349   LYMPHSABS 2.4 05/18/2014 1321   MONOABS 0.4 05/18/2014 1321   EOSABS 0.1 05/18/2014 1321   BASOSABS 0.0 05/18/2014 1321    No results found for: "POCLITH", "LITHIUM"   No results found for: "PHENYTOIN", "PHENOBARB", "VALPROATE", "CBMZ"   .res Assessment: Plan:    Gelila was seen today for follow-up, depression, sleeping problem and add.  Diagnoses and all orders for this visit:  Major depressive disorder with single episode, in full remission (HCC) -     DULoxetine (CYMBALTA) 30 MG capsule; 1 daily for 1 week then 2 daily for 2 weeks then 3 daily  Attention deficit hyperactivity disorder (ADHD), predominantly inattentive type  Insomnia due to mental condition  Sciatica of right side associated with disorder of lumbar spine -      DULoxetine (CYMBALTA) 30 MG capsule; 1 daily for 1 week then 2 daily for 2 weeks then 3 daily    RO MCI  30 min face to face time with patient was spent on counseling and coordination of care  Continue NAC for mild cognitive complaints. Disc Dr. Arlie Solomons article on Lithium and memory DT mother's hx dementia.  No problems off the lithium DT elevation mildly in CR.  Level not in Epic. Continue NAC (N-acetylcysteine) 600 mg capsule 1 daily for memory and concentration. Disc recent availability problems.  Disc dealing with med shortages and strategies around this problem.  She has missed the Concerta for 3 months because she was told it was not available.  However other patients are getting it from other pharmacies.  We will switch to [facility].  This is likely to resolve after the new year. Continue Concerta 72 mg every morning because markedly worse ADD over the last 3 months without it.  We discussed the short-term risks associated with benzodiazepines including sedation and increased fall risk among others.  Discussed long-term  side effect risk including dependence, potential withdrawal symptoms, and the potential eventual dose-related risk of dementia.  But recent studies from 2020 dispute this association between benzodiazepines and dementia risk. Newer studies in 2020 do not support an association with dementia. Continue Xanax 1 to 1-1/2 mg nightly for insomnia. Lowest dosage of Bz.  continue fluoxetine 20 mg daily helped residual depression vs option switch to duloxetine to see if can get pain benefit. Can go up to 90 mg to see if pain benefit if needed Keep working on PT and with other doctors to remedy the buldging disc also  Fu 6 mos  Meredith Staggers MD, DFAPA  Please see After Visit Summary for patient specific instructions.  No future appointments.    No orders of the defined types were placed in this encounter.     -------------------------------

## 2022-12-26 DIAGNOSIS — M5416 Radiculopathy, lumbar region: Secondary | ICD-10-CM | POA: Diagnosis not present

## 2023-01-01 DIAGNOSIS — M5416 Radiculopathy, lumbar region: Secondary | ICD-10-CM | POA: Diagnosis not present

## 2023-01-05 DIAGNOSIS — M5416 Radiculopathy, lumbar region: Secondary | ICD-10-CM | POA: Diagnosis not present

## 2023-01-10 DIAGNOSIS — M5416 Radiculopathy, lumbar region: Secondary | ICD-10-CM | POA: Diagnosis not present

## 2023-01-15 ENCOUNTER — Other Ambulatory Visit: Payer: Self-pay | Admitting: Psychiatry

## 2023-01-15 DIAGNOSIS — M5386 Other specified dorsopathies, lumbar region: Secondary | ICD-10-CM

## 2023-01-15 DIAGNOSIS — F325 Major depressive disorder, single episode, in full remission: Secondary | ICD-10-CM

## 2023-01-18 DIAGNOSIS — M5416 Radiculopathy, lumbar region: Secondary | ICD-10-CM | POA: Diagnosis not present

## 2023-02-01 DIAGNOSIS — M5416 Radiculopathy, lumbar region: Secondary | ICD-10-CM | POA: Diagnosis not present

## 2023-02-06 ENCOUNTER — Other Ambulatory Visit (HOSPITAL_BASED_OUTPATIENT_CLINIC_OR_DEPARTMENT_OTHER): Payer: Self-pay

## 2023-02-06 DIAGNOSIS — Z23 Encounter for immunization: Secondary | ICD-10-CM | POA: Diagnosis not present

## 2023-02-06 MED ORDER — COMIRNATY 30 MCG/0.3ML IM SUSY
0.3000 mL | PREFILLED_SYRINGE | Freq: Once | INTRAMUSCULAR | 0 refills | Status: AC
  Filled 2023-02-06: qty 0.3, 1d supply, fill #0

## 2023-02-07 DIAGNOSIS — M5416 Radiculopathy, lumbar region: Secondary | ICD-10-CM | POA: Diagnosis not present

## 2023-02-13 ENCOUNTER — Other Ambulatory Visit (HOSPITAL_BASED_OUTPATIENT_CLINIC_OR_DEPARTMENT_OTHER): Payer: Self-pay

## 2023-02-13 DIAGNOSIS — Z23 Encounter for immunization: Secondary | ICD-10-CM | POA: Diagnosis not present

## 2023-02-13 DIAGNOSIS — Z681 Body mass index (BMI) 19 or less, adult: Secondary | ICD-10-CM | POA: Diagnosis not present

## 2023-02-13 DIAGNOSIS — M4316 Spondylolisthesis, lumbar region: Secondary | ICD-10-CM | POA: Diagnosis not present

## 2023-02-13 MED ORDER — FLUAD 0.5 ML IM SUSY
0.5000 mL | PREFILLED_SYRINGE | Freq: Once | INTRAMUSCULAR | 0 refills | Status: AC
  Filled 2023-02-13: qty 0.5, 1d supply, fill #0

## 2023-02-15 ENCOUNTER — Other Ambulatory Visit (HOSPITAL_BASED_OUTPATIENT_CLINIC_OR_DEPARTMENT_OTHER): Payer: Self-pay

## 2023-02-19 DIAGNOSIS — M5416 Radiculopathy, lumbar region: Secondary | ICD-10-CM | POA: Diagnosis not present

## 2023-02-20 DIAGNOSIS — M5416 Radiculopathy, lumbar region: Secondary | ICD-10-CM | POA: Diagnosis not present

## 2023-03-06 ENCOUNTER — Other Ambulatory Visit: Payer: Self-pay | Admitting: Orthopedic Surgery

## 2023-03-08 ENCOUNTER — Telehealth: Payer: Self-pay | Admitting: Psychiatry

## 2023-03-08 NOTE — Telephone Encounter (Signed)
Brea called and cancelled her appt for 11/21 and RS for February.  She said the combination of medications did not help. She is having back surgery in 2 weeks so hopefully that will solve her problem.

## 2023-03-14 DIAGNOSIS — M5416 Radiculopathy, lumbar region: Secondary | ICD-10-CM | POA: Diagnosis not present

## 2023-03-15 ENCOUNTER — Ambulatory Visit: Payer: Medicare Other | Admitting: Psychiatry

## 2023-03-19 NOTE — Progress Notes (Signed)
Surgical Instructions   Your procedure is scheduled on Wednesday, March 28, 2023. Report to [facility] Main Entrance "A" at 6:30 A.M., then check in with the Admitting office. Any questions or running late day of surgery: call [phone]  Questions prior to your surgery date: call [phone], Monday-Friday, 8am-4pm. If you experience any cold or flu symptoms such as cough, fever, chills, shortness of breath, etc. between now and your scheduled surgery, please notify us at the above number.     Remember:  Do not eat after midnight the night before your surgery  You may drink clear liquids until 5:30 the morning of your surgery.   Clear liquids allowed are: Water, Non-Citrus Juices (without pulp), Carbonated Beverages, Clear Tea (no milk, honey, etc.), Black Coffee Only (NO MILK, CREAM OR POWDERED CREAMER of any kind), and Gatorade.    Take these medicines the morning of surgery with A SIP OF WATER   FLUoxetine (PROZAC)  omeprazole (PRILOSEC)  rosuvastatin (CRESTOR)   May take these medicines IF NEEDED:  ALPRAZolam (XANAX)  methylphenidate (CONCERTA) cyclobenzaprine (FLEXERIL)     One week prior to surgery, STOP taking any Aspirin (unless otherwise instructed by your surgeon) Aleve, Naproxen, Ibuprofen, Motrin, Advil, Goody's, BC's, all herbal medications, fish oil, and non-prescription vitamins.                     Do NOT Smoke (Tobacco/Vaping) for 24 hours prior to your procedure.  If you use a CPAP at night, you may bring your mask/headgear for your overnight stay.   You will be asked to remove any contacts, glasses, piercing's, hearing aid's, dentures/partials prior to surgery. Please bring cases for these items if needed.    Patients discharged the day of surgery will not be allowed to drive home, and someone needs to stay with them for 24 hours.  SURGICAL WAITING ROOM VISITATION Patients may have no more than 2 support people in the waiting area - these visitors  may rotate.   Pre-op nurse will coordinate an appropriate time for 1 ADULT support person, who may not rotate, to accompany patient in pre-op.  Children under the age of 58 must have an adult with them who is not the patient and must remain in the main waiting area with an adult.  If the patient needs to stay at the hospital during part of their recovery, the visitor guidelines for inpatient rooms apply.  Please refer to the [facility] website for the visitor guidelines for any additional information.   If you received a COVID test during your pre-op visit  it is requested that you wear a mask when out in public, stay away from anyone that may not be feeling well and notify your surgeon if you develop symptoms. If you have been in contact with anyone that has tested positive in the last 10 days please notify you surgeon.      Pre-operative 5 CHG Bathing Instructions   You can play a key role in reducing the risk of infection after surgery. Your skin needs to be as free of germs as possible. You can reduce the number of germs on your skin by washing with CHG (chlorhexidine gluconate) soap before surgery. CHG is an antiseptic soap that kills germs and continues to kill germs even after washing.   DO NOT use if you have an allergy to chlorhexidine/CHG or antibacterial soaps. If your skin becomes reddened or irritated, stop using the CHG and notify one of our RNs  at [phone].   Please shower with the CHG soap starting 4 days before surgery using the following schedule:     Please keep in mind the following:  DO NOT shave, including legs and underarms, starting the day of your first shower.   You may shave your face at any point before/day of surgery.  Place clean sheets on your bed the day you start using CHG soap. Use a clean washcloth (not used since being washed) for each shower. DO NOT sleep with pets once you start using the CHG.   CHG Shower Instructions:  Wash your face and  private area with normal soap. If you choose to wash your hair, wash first with your normal shampoo.  After you use shampoo/soap, rinse your hair and body thoroughly to remove shampoo/soap residue.  Turn the water OFF and apply about 3 tablespoons (45 ml) of CHG soap to a CLEAN washcloth.  Apply CHG soap ONLY FROM YOUR NECK DOWN TO YOUR TOES (washing for 3-5 minutes)  DO NOT use CHG soap on face, private areas, open wounds, or sores.  Pay special attention to the area where your surgery is being performed.  If you are having back surgery, having someone wash your back for you may be helpful. Wait 2 minutes after CHG soap is applied, then you may rinse off the CHG soap.  Pat dry with a clean towel  Put on clean clothes/pajamas   If you choose to wear lotion, please use ONLY the CHG-compatible lotions on the back of this paper.   Additional instructions for the day of surgery: DO NOT APPLY any lotions, deodorants, cologne, or perfumes.   Do not bring valuables to the hospital. [facility] is not responsible for any belongings/valuables. Do not wear nail polish, gel polish, artificial nails, or any other type of covering on natural nails (fingers and toes) Do not wear jewelry or makeup Put on clean/comfortable clothes.  Please brush your teeth.  Ask your nurse before applying any prescription medications to the skin.     CHG Compatible Lotions   Aveeno Moisturizing lotion  Cetaphil Moisturizing Cream  Cetaphil Moisturizing Lotion  Clairol Herbal Essence Moisturizing Lotion, Dry Skin  Clairol Herbal Essence Moisturizing Lotion, Extra Dry Skin  Clairol Herbal Essence Moisturizing Lotion, Normal Skin  Curel Age Defying Therapeutic Moisturizing Lotion with Alpha Hydroxy  Curel Extreme Care Body Lotion  Curel Soothing Hands Moisturizing Hand Lotion  Curel Therapeutic Moisturizing Cream, Fragrance-Free  Curel Therapeutic Moisturizing Lotion, Fragrance-Free  Curel Therapeutic Moisturizing  Lotion, Original Formula  Eucerin Daily Replenishing Lotion  Eucerin Dry Skin Therapy Plus Alpha Hydroxy Crme  Eucerin Dry Skin Therapy Plus Alpha Hydroxy Lotion  Eucerin Original Crme  Eucerin Original Lotion  Eucerin Plus Crme Eucerin Plus Lotion  Eucerin TriLipid Replenishing Lotion  Keri Anti-Bacterial Hand Lotion  Keri Deep Conditioning Original Lotion Dry Skin Formula Softly Scented  Keri Deep Conditioning Original Lotion, Fragrance Free Sensitive Skin Formula  Keri Lotion Fast Absorbing Fragrance Free Sensitive Skin Formula  Keri Lotion Fast Absorbing Softly Scented Dry Skin Formula  Keri Original Lotion  Keri Skin Renewal Lotion Keri Silky Smooth Lotion  Keri Silky Smooth Sensitive Skin Lotion  Nivea Body Creamy Conditioning Oil  Nivea Body Extra Enriched Teacher, adult education Moisturizing Lotion Nivea Crme  Nivea Skin Firming Lotion  NutraDerm 30 Skin Lotion  NutraDerm Skin Lotion  NutraDerm Therapeutic Skin Cream  NutraDerm Therapeutic Skin Lotion  ProShield Protective Hand Cream  Provon moisturizing lotion  Please read over the following fact sheets that you were given.

## 2023-03-20 ENCOUNTER — Other Ambulatory Visit: Payer: Self-pay

## 2023-03-20 ENCOUNTER — Encounter (HOSPITAL_COMMUNITY): Payer: Self-pay

## 2023-03-20 ENCOUNTER — Inpatient Hospital Stay (HOSPITAL_COMMUNITY)
Admission: RE | Admit: 2023-03-20 | Discharge: 2023-03-20 | Disposition: A | Discharge status: Home / Self Care | Payer: Medicare Other | Source: Ambulatory Visit | Attending: Orthopedic Surgery

## 2023-03-20 VITALS — BP 124/91 | HR 94 | Temp 98.4°F | Resp 18 | Ht 63.0 in | Wt 110.1 lb

## 2023-03-20 DIAGNOSIS — Z01818 Encounter for other preprocedural examination: Secondary | ICD-10-CM | POA: Diagnosis not present

## 2023-03-20 LAB — SURGICAL PCR SCREEN
MRSA, PCR: NEGATIVE
Staphylococcus aureus: NEGATIVE

## 2023-03-20 LAB — BASIC METABOLIC PANEL
Anion gap: 8 (ref 5–15)
BUN: 16 mg/dL (ref 8–23)
CO2: 26 mmol/L (ref 22–32)
Calcium: 9.3 mg/dL (ref 8.9–10.3)
Chloride: 95 mmol/L — ABNORMAL LOW (ref 98–111)
Creatinine, Ser: 0.94 mg/dL (ref 0.44–1.00)
GFR, Estimated: >60 mL/min (ref >60)
Glucose, Bld: 88 mg/dL (ref 70–99)
Potassium: 3.8 mmol/L (ref 3.5–5.1)
Sodium: 129 mmol/L — ABNORMAL LOW (ref 135–145)

## 2023-03-20 LAB — CBC
HCT: 31.4 % — ABNORMAL LOW (ref 36.0–46.0)
Hemoglobin: 10.7 g/dL — ABNORMAL LOW (ref 12.0–15.0)
MCH: 31 pg (ref 26.0–34.0)
MCHC: 34.1 g/dL (ref 30.0–36.0)
MCV: 91 fL (ref 80.0–100.0)
Platelets: 298 10*3/uL (ref 150–400)
RBC: 3.45 MIL/uL — ABNORMAL LOW (ref 3.87–5.11)
RDW: 11.5 % (ref 11.5–15.5)
WBC: 7.7 10*3/uL (ref 4.0–10.5)
nRBC: 0 % (ref 0.0–0.2)

## 2023-03-20 LAB — TYPE AND SCREEN
ABO/RH(D): O POS
Antibody Screen: NEGATIVE

## 2023-03-20 NOTE — Progress Notes (Signed)
PCP - Wynelle Beckmann Shaw,MD Cardiologist - denies  PPM/ICD - denies Device Orders -  Rep Notified -   Chest x-ray - na EKG - 03/20/23 Stress Test - 01/10/12 ECHO - denies Cardiac Cath - denies  Sleep Study - denies CPAP -   Fasting Blood Sugar - na Checks Blood Sugar _____ times a day  Last dose of GLP1 agonist-  na GLP1 instructions:   Blood Thinner Instructions:na Aspirin Instructions:  ERAS Protcol -clears until 0530 PRE-SURGERY Ensure or G2- Ensure  COVID TEST- na   Anesthesia review: yes- Na+ 129  Patient denies shortness of breath, fever, cough and chest pain at PAT appointment   All instructions explained to the patient, with a verbal understanding of the material. Patient agrees to go over the instructions while at home for a better understanding. Patient also instructed  to wear a mask when out in public prior to surgery. The opportunity to ask questions was provided.

## 2023-03-20 NOTE — Progress Notes (Addendum)
Surgical Instructions   Your procedure is scheduled on Wednesday, March 28, 2023. Report to [facility] The Main Entrance "A" at 6:30 A.M., then check in with the Admitting office. Any questions or running late day of surgery: call [phone]  Questions prior to your surgery date: call [phone], Monday-Friday, 8am-4pm. If you experience any cold or flu symptoms such as cough, fever, chills, shortness of breath, etc. between now and your scheduled surgery, please notify us at the above number.     Remember:  Do not eat after midnight the night before your surgery  You may drink clear liquids until 5:30 the morning of your surgery.   Clear liquids allowed are: Water, Non-Citrus Juices (without pulp), Carbonated Beverages, Clear Tea (no milk, honey, etc.), Black Coffee Only (NO MILK, CREAM OR POWDERED CREAMER of any kind), and Gatorade. Patient Instructions  The night before surgery:  No food after midnight. ONLY clear liquids after midnight  The day of surgery (if you do NOT have diabetes):  Drink ONE (1) Pre-Surgery Clear Ensure by 0530 am the morning of surgery. Drink in one sitting. Do not sip.  This drink was given to you during your hospital  pre-op appointment visit.  Nothing else to drink after completing the  Pre-Surgery Clear Ensure.    Take these medicines the morning of surgery with A SIP OF WATER   FLUoxetine (PROZAC)  omeprazole (PRILOSEC)  rosuvastatin (CRESTOR)   May take these medicines IF NEEDED:  ALPRAZolam (XANAX)  cyclobenzaprine (FLEXERIL)     One week prior to surgery, STOP taking any Aspirin (unless otherwise instructed by your surgeon) Aleve, Naproxen, Ibuprofen, Motrin, Advil, Goody's, BC's, all herbal medications, fish oil, and non-prescription vitamins.                     Do NOT Smoke (Tobacco/Vaping) for 24 hours prior to your procedure.  If you use a CPAP at night, you may bring your mask/headgear for your overnight stay.   You will be  asked to remove any contacts, glasses, piercing's, hearing aid's, dentures/partials prior to surgery. Please bring cases for these items if needed.    Patients discharged the day of surgery will not be allowed to drive home, and someone needs to stay with them for 24 hours.  SURGICAL WAITING ROOM VISITATION Patients may have no more than 2 support people in the waiting area - these visitors may rotate.   Pre-op nurse will coordinate an appropriate time for 1 ADULT support person, who may not rotate, to accompany patient in pre-op.  Children under the age of 2 must have an adult with them who is not the patient and must remain in the main waiting area with an adult.  If the patient needs to stay at the hospital during part of their recovery, the visitor guidelines for inpatient rooms apply.  Please refer to the [facility] website for the visitor guidelines for any additional information.   If you received a COVID test during your pre-op visit  it is requested that you wear a mask when out in public, stay away from anyone that may not be feeling well and notify your surgeon if you develop symptoms. If you have been in contact with anyone that has tested positive in the last 10 days please notify you surgeon.      Pre-operative 5 CHG Bathing Instructions   You can play a key role in reducing the risk of infection after surgery. Your skin needs to be  as free of germs as possible. You can reduce the number of germs on your skin by washing with CHG (chlorhexidine gluconate) soap before surgery. CHG is an antiseptic soap that kills germs and continues to kill germs even after washing.   DO NOT use if you have an allergy to chlorhexidine/CHG or antibacterial soaps. If your skin becomes reddened or irritated, stop using the CHG and notify one of our RNs at [phone].   Please shower with the CHG soap starting 4 days before surgery using the following schedule:     Please keep in mind the  following:  DO NOT shave, including legs and underarms, starting the day of your first shower.   You may shave your face at any point before/day of surgery.  Place clean sheets on your bed the day you start using CHG soap. Use a clean washcloth (not used since being washed) for each shower. DO NOT sleep with pets once you start using the CHG.   CHG Shower Instructions:  Wash your face and private area with normal soap. If you choose to wash your hair, wash first with your normal shampoo.  After you use shampoo/soap, rinse your hair and body thoroughly to remove shampoo/soap residue.  Turn the water OFF and apply about 3 tablespoons (45 ml) of CHG soap to a CLEAN washcloth.  Apply CHG soap ONLY FROM YOUR NECK DOWN TO YOUR TOES (washing for 3-5 minutes)  DO NOT use CHG soap on face, private areas, open wounds, or sores.  Pay special attention to the area where your surgery is being performed.  If you are having back surgery, having someone wash your back for you may be helpful. Wait 2 minutes after CHG soap is applied, then you may rinse off the CHG soap.  Pat dry with a clean towel  Put on clean clothes/pajamas   If you choose to wear lotion, please use ONLY the CHG-compatible lotions on the back of this paper.   Additional instructions for the day of surgery: DO NOT APPLY any lotions, deodorants, cologne, or perfumes.   Do not bring valuables to the hospital. [facility] is not responsible for any belongings/valuables. Do not wear nail polish, gel polish, artificial nails, or any other type of covering on natural nails (fingers and toes) Do not wear jewelry or makeup Put on clean/comfortable clothes.  Please brush your teeth.  Ask your nurse before applying any prescription medications to the skin.     CHG Compatible Lotions   Aveeno Moisturizing lotion  Cetaphil Moisturizing Cream  Cetaphil Moisturizing Lotion  Clairol Herbal Essence Moisturizing Lotion, Dry Skin  Clairol  Herbal Essence Moisturizing Lotion, Extra Dry Skin  Clairol Herbal Essence Moisturizing Lotion, Normal Skin  Curel Age Defying Therapeutic Moisturizing Lotion with Alpha Hydroxy  Curel Extreme Care Body Lotion  Curel Soothing Hands Moisturizing Hand Lotion  Curel Therapeutic Moisturizing Cream, Fragrance-Free  Curel Therapeutic Moisturizing Lotion, Fragrance-Free  Curel Therapeutic Moisturizing Lotion, Original Formula  Eucerin Daily Replenishing Lotion  Eucerin Dry Skin Therapy Plus Alpha Hydroxy Crme  Eucerin Dry Skin Therapy Plus Alpha Hydroxy Lotion  Eucerin Original Crme  Eucerin Original Lotion  Eucerin Plus Crme Eucerin Plus Lotion  Eucerin TriLipid Replenishing Lotion  Keri Anti-Bacterial Hand Lotion  Keri Deep Conditioning Original Lotion Dry Skin Formula Softly Scented  Keri Deep Conditioning Original Lotion, Fragrance Free Sensitive Skin Formula  Keri Lotion Fast Absorbing Fragrance Free Sensitive Skin Formula  Keri Lotion Fast Absorbing Softly Scented Dry Skin Formula  Keri Original Lotion  [employer] Skin Renewal Lotion Keri Silky Smooth Lotion  Keri Silky Smooth Sensitive Skin Lotion  Nivea Body Creamy Conditioning Patent examiner Moisturizing Lotion Nivea Crme  Nivea Skin Firming Lotion  NutraDerm 30 Skin Lotion  NutraDerm Skin Lotion  NutraDerm Therapeutic Skin Cream  NutraDerm Therapeutic Skin Lotion  ProShield Protective Hand Cream  Provon moisturizing lotion  Please read over the following fact sheets that you were given.

## 2023-03-21 NOTE — Anesthesia Preprocedure Evaluation (Addendum)
Anesthesia Evaluation  Patient identified by MRN, date of birth, ID band Patient awake    Reviewed: Allergy & Precautions, NPO status , Patient's Chart, lab work & pertinent test results  Airway Mallampati: II  TM Distance: >3 FB Neck ROM: Full    Dental no notable dental hx. (+) Teeth Intact, Dental Advisory Given   Pulmonary neg pulmonary ROS, former smoker   Pulmonary exam normal breath sounds clear to auscultation       Cardiovascular hypertension, Pt. on medications Normal cardiovascular exam Rhythm:Regular Rate:Normal     Neuro/Psych  PSYCHIATRIC DISORDERS         GI/Hepatic Neg liver ROS,GERD  ,,  Endo/Other    Renal/GU Renal disease     Musculoskeletal  (+) Arthritis , Osteoarthritis,    Abdominal   Peds  Hematology   Anesthesia Other Findings   Reproductive/Obstetrics                             Anesthesia Physical Anesthesia Plan  ASA: II  Anesthesia Plan: General   Post-op Pain Management:  Regional for Post-op pain and Dilaudid IV   Induction: Intravenous  PONV Risk Score and Plan: 3 and Treatment may vary due to age or medical condition, Ondansetron, Dexamethasone and Midazolam  Airway Management Planned: Oral ETT  Additional Equipment: None  Intra-op Plan:   Post-operative Plan: Extubation in OR  Informed Consent: I have reviewed the patients History and Physical, chart, labs and discussed the procedure including the risks, benefits and alternatives for the proposed anesthesia with the patient or authorized representative who has indicated his/her understanding and acceptance.     Dental advisory given  Plan Discussed with: CRNA and Anesthesiologist  Anesthesia Plan Comments: (Hyponatremia noted with sodium 129 on preop labs.  Mild anemia with hemoglobin 10.7.  Preop labs otherwise unremarkable.  Will check i-STAT on day of surgery.)        Anesthesia  Quick Evaluation

## 2023-03-28 ENCOUNTER — Other Ambulatory Visit: Payer: Self-pay

## 2023-03-28 ENCOUNTER — Ambulatory Visit (HOSPITAL_COMMUNITY): Payer: Medicare Other

## 2023-03-28 ENCOUNTER — Encounter (HOSPITAL_COMMUNITY): Payer: Self-pay | Admitting: Orthopedic Surgery

## 2023-03-28 ENCOUNTER — Ambulatory Visit (HOSPITAL_COMMUNITY)
Admission: RE | Disposition: A | Discharge status: Home / Self Care | Payer: Self-pay | Source: Home / Self Care | Attending: Orthopedic Surgery

## 2023-03-28 ENCOUNTER — Ambulatory Visit (HOSPITAL_BASED_OUTPATIENT_CLINIC_OR_DEPARTMENT_OTHER): Payer: Medicare Other | Admitting: Physician Assistant

## 2023-03-28 ENCOUNTER — Observation Stay (HOSPITAL_COMMUNITY)
Admission: RE | Admit: 2023-03-28 | Discharge: 2023-03-29 | Disposition: A | Discharge status: Home / Self Care | Payer: Medicare Other | Attending: Orthopedic Surgery | Admitting: Orthopedic Surgery

## 2023-03-28 ENCOUNTER — Ambulatory Visit (HOSPITAL_COMMUNITY): Payer: Medicare Other | Admitting: Physician Assistant

## 2023-03-28 DIAGNOSIS — Z87891 Personal history of nicotine dependence: Secondary | ICD-10-CM | POA: Diagnosis not present

## 2023-03-28 DIAGNOSIS — K219 Gastro-esophageal reflux disease without esophagitis: Secondary | ICD-10-CM | POA: Diagnosis not present

## 2023-03-28 DIAGNOSIS — M4316 Spondylolisthesis, lumbar region: Secondary | ICD-10-CM | POA: Diagnosis not present

## 2023-03-28 DIAGNOSIS — Z79899 Other long term (current) drug therapy: Secondary | ICD-10-CM | POA: Insufficient documentation

## 2023-03-28 DIAGNOSIS — M5410 Radiculopathy, site unspecified: Secondary | ICD-10-CM | POA: Diagnosis present

## 2023-03-28 DIAGNOSIS — M5416 Radiculopathy, lumbar region: Secondary | ICD-10-CM | POA: Diagnosis not present

## 2023-03-28 DIAGNOSIS — Z4689 Encounter for fitting and adjustment of other specified devices: Secondary | ICD-10-CM | POA: Diagnosis not present

## 2023-03-28 DIAGNOSIS — Z0189 Encounter for other specified special examinations: Secondary | ICD-10-CM | POA: Diagnosis not present

## 2023-03-28 DIAGNOSIS — M48061 Spinal stenosis, lumbar region without neurogenic claudication: Secondary | ICD-10-CM | POA: Diagnosis not present

## 2023-03-28 DIAGNOSIS — I10 Essential (primary) hypertension: Principal | ICD-10-CM | POA: Insufficient documentation

## 2023-03-28 HISTORY — PX: TRANSFORAMINAL LUMBAR INTERBODY FUSION (TLIF) WITH PEDICLE SCREW FIXATION 1 LEVEL: SHX6141

## 2023-03-28 LAB — POCT I-STAT, CHEM 8
BUN: 16 mg/dL (ref 8–23)
Calcium, Ion: 1.28 mmol/L (ref 1.15–1.40)
Chloride: 103 mmol/L (ref 98–111)
Creatinine, Ser: 0.9 mg/dL (ref 0.44–1.00)
Glucose, Bld: 86 mg/dL (ref 70–99)
HCT: 33 % — ABNORMAL LOW (ref 36.0–46.0)
Hemoglobin: 11.2 g/dL — ABNORMAL LOW (ref 12.0–15.0)
Potassium: 3.6 mmol/L (ref 3.5–5.1)
Sodium: 137 mmol/L (ref 135–145)
TCO2: 23 mmol/L (ref 22–32)

## 2023-03-28 LAB — ABO/RH: ABO/RH(D): O POS

## 2023-03-28 SURGERY — TRANSFORAMINAL LUMBAR INTERBODY FUSION (TLIF) WITH PEDICLE SCREW FIXATION 1 LEVEL
Anesthesia: General | Site: Spine Lumbar | Laterality: Right

## 2023-03-28 MED ORDER — MIDAZOLAM HCL 2 MG/2ML IJ SOLN
INTRAMUSCULAR | Status: DC | PRN
  Administered 2023-03-28: 2 mg via INTRAVENOUS

## 2023-03-28 MED ORDER — ALBUMIN HUMAN 5 % IV SOLN: INTRAVENOUS | Status: DC | PRN

## 2023-03-28 MED ORDER — ORAL CARE MOUTH RINSE: 15.0000 mL | Freq: Once | OROMUCOSAL | Status: AC

## 2023-03-28 MED ORDER — SODIUM CHLORIDE 0.9% FLUSH: 3.0000 mL | INTRAVENOUS | Status: DC | PRN

## 2023-03-28 MED ORDER — SENNOSIDES-DOCUSATE SODIUM 8.6-50 MG PO TABS: 1.0000 | ORAL_TABLET | Freq: Every evening | ORAL | Status: DC | PRN

## 2023-03-28 MED ORDER — ONDANSETRON HCL 4 MG/2ML IJ SOLN
INTRAMUSCULAR | Status: DC | PRN
  Administered 2023-03-28: 4 mg via INTRAVENOUS

## 2023-03-28 MED ORDER — MIDAZOLAM HCL 2 MG/2ML IJ SOLN
INTRAMUSCULAR | Status: AC
Start: 2023-03-28 — End: ?
  Filled 2023-03-28: qty 2

## 2023-03-28 MED ORDER — BUPIVACAINE LIPOSOME 1.3 % IJ SUSP
INTRAMUSCULAR | Status: AC
  Filled 2023-03-28: qty 20

## 2023-03-28 MED ORDER — PHENYLEPHRINE HCL-NACL 20-0.9 MG/250ML-% IV SOLN
INTRAVENOUS | Status: DC | PRN
  Administered 2023-03-28: 30 ug/min via INTRAVENOUS

## 2023-03-28 MED ORDER — PROPOFOL 10 MG/ML IV BOLUS
INTRAVENOUS | Status: DC | PRN
  Administered 2023-03-28: 120 mg via INTRAVENOUS

## 2023-03-28 MED ORDER — PHENYLEPHRINE 80 MCG/ML (10ML) SYRINGE FOR IV PUSH (FOR BLOOD PRESSURE SUPPORT)
PREFILLED_SYRINGE | INTRAVENOUS | Status: DC | PRN
  Administered 2023-03-28 (×3): 80 ug via INTRAVENOUS

## 2023-03-28 MED ORDER — EPHEDRINE SULFATE (PRESSORS) 50 MG/ML IJ SOLN: INTRAMUSCULAR | Status: DC | PRN

## 2023-03-28 MED ORDER — OXYCODONE-ACETAMINOPHEN 5-325 MG PO TABS
ORAL_TABLET | ORAL | Status: AC
  Administered 2023-03-28: 1 via ORAL
  Filled 2023-03-28: qty 1

## 2023-03-28 MED ORDER — KETOROLAC TROMETHAMINE 0.5 % OP SOLN
OPHTHALMIC | Status: AC
  Administered 2023-03-28: 1 [drp]
  Filled 2023-03-28: qty 5

## 2023-03-28 MED ORDER — METHYLPHENIDATE HCL ER (OSM) 36 MG PO TBCR: 72.0000 mg | EXTENDED_RELEASE_TABLET | Freq: Every day | ORAL | Status: DC | PRN

## 2023-03-28 MED ORDER — ALUM & MAG HYDROXIDE-SIMETH 200-200-20 MG/5ML PO SUSP
30.0000 mL | Freq: Four times a day (QID) | ORAL | Status: DC | PRN
  Administered 2023-03-29: 30 mL via ORAL
  Filled 2023-03-28: qty 30

## 2023-03-28 MED ORDER — OXYCODONE-ACETAMINOPHEN 5-325 MG PO TABS
1.0000 | ORAL_TABLET | ORAL | Status: DC | PRN
  Administered 2023-03-28 – 2023-03-29 (×2): 2 via ORAL
  Filled 2023-03-28 (×2): qty 2

## 2023-03-28 MED ORDER — CEFAZOLIN SODIUM-DEXTROSE 2-4 GM/100ML-% IV SOLN
2.0000 g | Freq: Three times a day (TID) | INTRAVENOUS | Status: AC
  Administered 2023-03-28 – 2023-03-29 (×2): 2 g via INTRAVENOUS
  Filled 2023-03-28 (×2): qty 100

## 2023-03-28 MED ORDER — THROMBIN 20000 UNITS EX KIT
PACK | CUTANEOUS | Status: DC | PRN
  Administered 2023-03-28: 20 mL via TOPICAL

## 2023-03-28 MED ORDER — LACTATED RINGERS IV SOLN: INTRAVENOUS | Status: DC | PRN

## 2023-03-28 MED ORDER — HYDROMORPHONE HCL 1 MG/ML IJ SOLN: 0.2500 mg | INTRAMUSCULAR | Status: DC | PRN

## 2023-03-28 MED ORDER — BSS IO SOLN: 15.0000 mL | Freq: Once | INTRAOCULAR | Status: DC

## 2023-03-28 MED ORDER — AMISULPRIDE (ANTIEMETIC) 5 MG/2ML IV SOLN: 10.0000 mg | Freq: Once | INTRAVENOUS | Status: DC | PRN

## 2023-03-28 MED ORDER — ROCURONIUM BROMIDE 10 MG/ML (PF) SYRINGE
PREFILLED_SYRINGE | INTRAVENOUS | Status: DC | PRN
  Administered 2023-03-28: 10 mg via INTRAVENOUS
  Administered 2023-03-28: 70 mg via INTRAVENOUS
  Administered 2023-03-28: 10 mg via INTRAVENOUS

## 2023-03-28 MED ORDER — MENTHOL 3 MG MT LOZG: 1.0000 | LOZENGE | OROMUCOSAL | Status: DC | PRN

## 2023-03-28 MED ORDER — 0.9 % SODIUM CHLORIDE (POUR BTL) OPTIME
TOPICAL | Status: DC | PRN
  Administered 2023-03-28 (×2): 1000 mL

## 2023-03-28 MED ORDER — CYCLOBENZAPRINE HCL 10 MG PO TABS: 10.0000 mg | ORAL_TABLET | Freq: Three times a day (TID) | ORAL | Status: DC | PRN

## 2023-03-28 MED ORDER — ONDANSETRON HCL 4 MG PO TABS
4.0000 mg | ORAL_TABLET | Freq: Four times a day (QID) | ORAL | Status: DC | PRN
  Filled 2023-03-28: qty 1

## 2023-03-28 MED ORDER — FLEET ENEMA RE ENEM: 1.0000 | ENEMA | Freq: Once | RECTAL | Status: DC | PRN

## 2023-03-28 MED ORDER — SODIUM CHLORIDE 0.9 % IV SOLN: 12.5000 mg | INTRAVENOUS | Status: DC | PRN

## 2023-03-28 MED ORDER — ALPRAZOLAM 0.5 MG PO TABS: 1.0000 mg | ORAL_TABLET | Freq: Every evening | ORAL | Status: DC | PRN

## 2023-03-28 MED ORDER — ROSUVASTATIN CALCIUM 5 MG PO TABS
10.0000 mg | ORAL_TABLET | Freq: Every day | ORAL | Status: DC
  Administered 2023-03-28: 10 mg via ORAL
  Filled 2023-03-28: qty 2

## 2023-03-28 MED ORDER — BUPIVACAINE-EPINEPHRINE (PF) 0.25% -1:200000 IJ SOLN
INTRAMUSCULAR | Status: AC
  Filled 2023-03-28: qty 30

## 2023-03-28 MED ORDER — HYDROMORPHONE HCL 1 MG/ML IJ SOLN
INTRAMUSCULAR | Status: AC
  Filled 2023-03-28: qty 0.5

## 2023-03-28 MED ORDER — ONDANSETRON HCL 4 MG/2ML IJ SOLN
4.0000 mg | Freq: Four times a day (QID) | INTRAMUSCULAR | Status: DC | PRN
  Administered 2023-03-28 – 2023-03-29 (×2): 4 mg via INTRAVENOUS
  Filled 2023-03-28 (×2): qty 2

## 2023-03-28 MED ORDER — CEFAZOLIN SODIUM-DEXTROSE 2-4 GM/100ML-% IV SOLN
2.0000 g | INTRAVENOUS | Status: AC
  Administered 2023-03-28: 2 g via INTRAVENOUS
  Filled 2023-03-28: qty 100

## 2023-03-28 MED ORDER — EPHEDRINE SULFATE-NACL 50-0.9 MG/10ML-% IV SOSY
PREFILLED_SYRINGE | INTRAVENOUS | Status: DC | PRN
  Administered 2023-03-28: 5 mg via INTRAVENOUS

## 2023-03-28 MED ORDER — HYDROMORPHONE HCL 1 MG/ML IJ SOLN
INTRAMUSCULAR | Status: DC | PRN
  Administered 2023-03-28: .5 mg via INTRAVENOUS

## 2023-03-28 MED ORDER — MORPHINE SULFATE (PF) 2 MG/ML IV SOLN
2.0000 mg | INTRAVENOUS | Status: DC | PRN
  Administered 2023-03-28: 2 mg via INTRAVENOUS
  Filled 2023-03-28: qty 1

## 2023-03-28 MED ORDER — POVIDONE-IODINE 7.5 % EX SOLN: Freq: Once | CUTANEOUS | Status: DC

## 2023-03-28 MED ORDER — ACETAMINOPHEN 325 MG PO TABS: 650.0000 mg | ORAL_TABLET | ORAL | Status: DC | PRN

## 2023-03-28 MED ORDER — HYDROCHLOROTHIAZIDE 12.5 MG PO TABS
12.5000 mg | ORAL_TABLET | Freq: Every day | ORAL | Status: DC
  Administered 2023-03-29: 12.5 mg via ORAL
  Filled 2023-03-28: qty 1

## 2023-03-28 MED ORDER — DEXAMETHASONE SODIUM PHOSPHATE 10 MG/ML IJ SOLN
INTRAMUSCULAR | Status: DC | PRN
  Administered 2023-03-28: 5 mg via INTRAVENOUS

## 2023-03-28 MED ORDER — SUGAMMADEX SODIUM 200 MG/2ML IV SOLN
INTRAVENOUS | Status: DC | PRN
  Administered 2023-03-28: 100 mg via INTRAVENOUS

## 2023-03-28 MED ORDER — BSS IO SOLN
INTRAOCULAR | Status: AC
  Administered 2023-03-28: 15 mL via INTRAOCULAR
  Filled 2023-03-28: qty 15

## 2023-03-28 MED ORDER — DOCUSATE SODIUM 100 MG PO CAPS
100.0000 mg | ORAL_CAPSULE | Freq: Two times a day (BID) | ORAL | Status: DC
  Administered 2023-03-28 – 2023-03-29 (×2): 100 mg via ORAL
  Filled 2023-03-28 (×2): qty 1

## 2023-03-28 MED ORDER — KETOROLAC TROMETHAMINE 0.5 % OP SOLN
1.0000 [drp] | Freq: Four times a day (QID) | OPHTHALMIC | Status: DC
  Administered 2023-03-28 – 2023-03-29 (×3): 1 [drp] via OPHTHALMIC

## 2023-03-28 MED ORDER — HYDROMORPHONE HCL 1 MG/ML IJ SOLN
INTRAMUSCULAR | Status: AC
  Administered 2023-03-28: 0.5 mg via INTRAVENOUS
  Filled 2023-03-28: qty 1

## 2023-03-28 MED ORDER — CHLORHEXIDINE GLUCONATE 0.12 % MT SOLN
15.0000 mL | Freq: Once | OROMUCOSAL | Status: AC
Start: 2023-03-28 — End: 2023-03-28
  Administered 2023-03-28: 15 mL via OROMUCOSAL
  Filled 2023-03-28: qty 15

## 2023-03-28 MED ORDER — SODIUM CHLORIDE 0.9 % IV SOLN
250.0000 mL | INTRAVENOUS | Status: DC
  Administered 2023-03-28: 250 mL via INTRAVENOUS

## 2023-03-28 MED ORDER — BISACODYL 5 MG PO TBEC: 5.0000 mg | DELAYED_RELEASE_TABLET | Freq: Every day | ORAL | Status: DC | PRN

## 2023-03-28 MED ORDER — IRBESARTAN 300 MG PO TABS
300.0000 mg | ORAL_TABLET | Freq: Every day | ORAL | Status: DC
  Administered 2023-03-29: 300 mg via ORAL
  Filled 2023-03-28: qty 1

## 2023-03-28 MED ORDER — SODIUM CHLORIDE 0.9% FLUSH
10.0000 mL | Freq: Two times a day (BID) | INTRAVENOUS | Status: DC
  Administered 2023-03-28 – 2023-03-29 (×2): 10 mL via INTRAVENOUS

## 2023-03-28 MED ORDER — DULOXETINE HCL 60 MG PO CPEP: 90.0000 mg | ORAL_CAPSULE | Freq: Every day | ORAL | Status: DC

## 2023-03-28 MED ORDER — THROMBIN 20000 UNITS EX SOLR
CUTANEOUS | Status: AC
  Filled 2023-03-28: qty 20000

## 2023-03-28 MED ORDER — PHENOL 1.4 % MT LIQD: 1.0000 | OROMUCOSAL | Status: DC | PRN

## 2023-03-28 MED ORDER — ZOLPIDEM TARTRATE 5 MG PO TABS: 5.0000 mg | ORAL_TABLET | Freq: Every evening | ORAL | Status: DC | PRN

## 2023-03-28 MED ORDER — FENTANYL CITRATE (PF) 250 MCG/5ML IJ SOLN
INTRAMUSCULAR | Status: DC | PRN
  Administered 2023-03-28: 100 ug via INTRAVENOUS
  Administered 2023-03-28 (×2): 50 ug via INTRAVENOUS

## 2023-03-28 MED ORDER — BUPIVACAINE-EPINEPHRINE 0.25% -1:200000 IJ SOLN
INTRAMUSCULAR | Status: DC | PRN
  Administered 2023-03-28: 6 mL

## 2023-03-28 MED ORDER — PHENYLEPHRINE HCL (PRESSORS) 10 MG/ML IV SOLN: INTRAVENOUS | Status: DC | PRN

## 2023-03-28 MED ORDER — BSS IO SOLN: 15.0000 mL | Freq: Once | INTRAOCULAR | Status: AC

## 2023-03-28 MED ORDER — TRAMADOL HCL 50 MG PO TABS
100.0000 mg | ORAL_TABLET | Freq: Four times a day (QID) | ORAL | Status: DC | PRN
  Administered 2023-03-29: 100 mg via ORAL
  Filled 2023-03-28: qty 2

## 2023-03-28 MED ORDER — FLUOXETINE HCL 20 MG PO CAPS
20.0000 mg | ORAL_CAPSULE | Freq: Every day | ORAL | Status: DC
  Administered 2023-03-29: 20 mg via ORAL
  Filled 2023-03-28: qty 1

## 2023-03-28 MED ORDER — BUPIVACAINE-EPINEPHRINE (PF) 0.25% -1:200000 IJ SOLN
INTRAMUSCULAR | Status: DC | PRN
  Administered 2023-03-28: 40 mL

## 2023-03-28 MED ORDER — PHENYLEPHRINE HCL-NACL 20-0.9 MG/250ML-% IV SOLN
INTRAVENOUS | Status: AC
Start: 2023-03-28 — End: ?
  Filled 2023-03-28: qty 250

## 2023-03-28 MED ORDER — SODIUM CHLORIDE 0.9% FLUSH
3.0000 mL | Freq: Two times a day (BID) | INTRAVENOUS | Status: DC
  Administered 2023-03-28: 3 mL via INTRAVENOUS

## 2023-03-28 MED ORDER — PROPOFOL 10 MG/ML IV BOLUS
INTRAVENOUS | Status: AC
  Filled 2023-03-28: qty 20

## 2023-03-28 MED ORDER — ACETAMINOPHEN 650 MG RE SUPP: 650.0000 mg | RECTAL | Status: DC | PRN

## 2023-03-28 MED ORDER — FENTANYL CITRATE (PF) 250 MCG/5ML IJ SOLN
INTRAMUSCULAR | Status: AC
  Filled 2023-03-28: qty 5

## 2023-03-28 SURGICAL SUPPLY — 86 items
BAG COUNTER SPONGE SURGICOUNT (BAG) ×1 IMPLANT
BENZOIN TINCTURE PRP APPL 2/3 (GAUZE/BANDAGES/DRESSINGS) ×1 IMPLANT
BLADE CLIPPER SURG (BLADE) IMPLANT
BUR PRESCISION 1.7 ELITE (BURR) ×1 IMPLANT
BUR ROUND FLUTED 5 RND (BURR) ×1 IMPLANT
BUR ROUND PRECISION 4.0 (BURR) IMPLANT
BUR SABER RD CUTTING 3.0 (BURR) IMPLANT
CAGE SABLE 10X22 6-12 8D (Cage) IMPLANT
CANNULA GRAFT BNE VG PRE-FILL (Bone Implant) IMPLANT
CNTNR URN SCR LID CUP LEK RST (MISCELLANEOUS) ×1 IMPLANT
COVER BACK TABLE 60X90IN (DRAPES) ×1 IMPLANT
COVER MAYO STAND STRL (DRAPES) ×2 IMPLANT
COVER SURGICAL LIGHT HANDLE (MISCELLANEOUS) ×1 IMPLANT
DISPENSER GRAFT BNE VG (MISCELLANEOUS) IMPLANT
DISPENSER VIVIGEN BONE GRAFT (MISCELLANEOUS) ×1
DRAIN CHANNEL 15F RND FF W/TCR (WOUND CARE) IMPLANT
DRAPE C-ARM 42X72 X-RAY (DRAPES) ×1 IMPLANT
DRAPE C-ARMOR (DRAPES) IMPLANT
DRAPE INCISE IOBAN 66X45 STRL (DRAPES) IMPLANT
DRAPE POUCH INSTRU U-SHP 10X18 (DRAPES) ×1 IMPLANT
DRAPE SURG 17X23 STRL (DRAPES) ×4 IMPLANT
DURAPREP 26ML APPLICATOR (WOUND CARE) ×1 IMPLANT
ELECT BLADE 4.0 EZ CLEAN MEGAD (MISCELLANEOUS) ×1
ELECT CAUTERY BLADE 6.4 (BLADE) ×1 IMPLANT
ELECT REM PT RETURN 9FT ADLT (ELECTROSURGICAL) ×1
ELECTRODE BLDE 4.0 EZ CLN MEGD (MISCELLANEOUS) ×1 IMPLANT
ELECTRODE REM PT RTRN 9FT ADLT (ELECTROSURGICAL) ×1 IMPLANT
EVACUATOR SILICONE 100CC (DRAIN) IMPLANT
FILTER STRAW FLUID ASPIR (MISCELLANEOUS) ×1 IMPLANT
GAUZE 4X4 16PLY ~~LOC~~+RFID DBL (SPONGE) ×1 IMPLANT
GAUZE SPONGE 4X4 12PLY STRL (GAUZE/BANDAGES/DRESSINGS) ×1 IMPLANT
GLOVE BIO SURGEON STRL SZ 6.5 (GLOVE) ×1 IMPLANT
GLOVE BIO SURGEON STRL SZ8 (GLOVE) ×1 IMPLANT
GLOVE BIOGEL PI IND STRL 7.0 (GLOVE) ×1 IMPLANT
GLOVE BIOGEL PI IND STRL 8 (GLOVE) ×1 IMPLANT
GLOVE SURG ENC MOIS LTX SZ6.5 (GLOVE) ×1 IMPLANT
GOWN STRL REUS W/ TWL LRG LVL3 (GOWN DISPOSABLE) ×2 IMPLANT
GOWN STRL REUS W/ TWL XL LVL3 (GOWN DISPOSABLE) ×1 IMPLANT
GRAFT BONE CANNULA VIVIGEN 3 (Bone Implant) ×2 IMPLANT
IV CATH 14GX2 1/4 (CATHETERS) ×1 IMPLANT
KIT BASIN OR (CUSTOM PROCEDURE TRAY) ×1 IMPLANT
KIT POSITION SURG JACKSON T1 (MISCELLANEOUS) ×1 IMPLANT
KIT TURNOVER KIT B (KITS) ×1 IMPLANT
MARKER SKIN DUAL TIP RULER LAB (MISCELLANEOUS) ×2 IMPLANT
NDL 18GX1X1/2 (RX/OR ONLY) (NEEDLE) ×1 IMPLANT
NDL 22X1.5 STRL (OR ONLY) (MISCELLANEOUS) ×2 IMPLANT
NDL HYPO 25GX1X1/2 BEV (NEEDLE) ×1 IMPLANT
NDL SPNL 18GX3.5 QUINCKE PK (NEEDLE) ×2 IMPLANT
NEEDLE 18GX1X1/2 (RX/OR ONLY) (NEEDLE) ×1
NEEDLE 22X1.5 STRL (OR ONLY) (MISCELLANEOUS) ×1
NEEDLE HYPO 25GX1X1/2 BEV (NEEDLE) ×1
NEEDLE SPNL 18GX3.5 QUINCKE PK (NEEDLE) ×2
NS IRRIG 1000ML POUR BTL (IV SOLUTION) ×1 IMPLANT
PACK LAMINECTOMY ORTHO (CUSTOM PROCEDURE TRAY) ×1 IMPLANT
PACK UNIVERSAL I (CUSTOM PROCEDURE TRAY) ×1 IMPLANT
PAD ARMBOARD 7.5X6 YLW CONV (MISCELLANEOUS) ×2 IMPLANT
PATTIES SURGICAL .5 X1 (DISPOSABLE) ×1 IMPLANT
PATTIES SURGICAL .5X1.5 (GAUZE/BANDAGES/DRESSINGS) ×1 IMPLANT
PUTTY DBX 2.5CC (Putty) ×1 IMPLANT
PUTTY DBX 2.5CC DEPUY (Putty) IMPLANT
ROD EXPEDIUM 30MM (Rod) IMPLANT
SCREW SET SINGLE INNER (Screw) IMPLANT
SCREW VIPER 6MMX25MM (Screw) IMPLANT
SCREW VIPER CORT FIX 6.00X30 (Screw) IMPLANT
SCREW VIPER CORT FIX 6X35 (Screw) IMPLANT
SPONGE INTESTINAL PEANUT (DISPOSABLE) ×1 IMPLANT
SPONGE SURGIFOAM ABS GEL 100 (HEMOSTASIS) ×1 IMPLANT
STRIP CLOSURE SKIN 1/2X4 (GAUZE/BANDAGES/DRESSINGS) ×2 IMPLANT
SURGIFLO W/THROMBIN 8M KIT (HEMOSTASIS) IMPLANT
SUT ETHILON 2 0 FS 18 (SUTURE) IMPLANT
SUT MNCRL AB 4-0 PS2 18 (SUTURE) ×1 IMPLANT
SUT VIC AB 0 CT1 18XCR BRD 8 (SUTURE) ×1 IMPLANT
SUT VIC AB 1 CT1 18XCR BRD 8 (SUTURE) ×1 IMPLANT
SUT VIC AB 2-0 CT2 18 VCP726D (SUTURE) ×1 IMPLANT
SYR 20ML LL LF (SYRINGE) ×2 IMPLANT
SYR BULB IRRIG 60ML STRL (SYRINGE) ×1 IMPLANT
SYR CONTROL 10ML LL (SYRINGE) ×2 IMPLANT
SYR TB 1ML LUER SLIP (SYRINGE) ×1 IMPLANT
TAP EXPEDIUM DL 4.35 (INSTRUMENTS) IMPLANT
TAP EXPEDIUM DL 5.0 (INSTRUMENTS) IMPLANT
TAP EXPEDIUM DL 6.0 (INSTRUMENTS) IMPLANT
TAPE CLOTH SURG 4X10 WHT LF (GAUZE/BANDAGES/DRESSINGS) IMPLANT
TRAY FOLEY MTR SLVR 16FR STAT (SET/KITS/TRAYS/PACK) ×1 IMPLANT
TUBE FUNNEL GL DISP (ORTHOPEDIC DISPOSABLE SUPPLIES) IMPLANT
WATER STERILE IRR 1000ML POUR (IV SOLUTION) ×1 IMPLANT
YANKAUER SUCT BULB TIP NO VENT (SUCTIONS) ×1 IMPLANT

## 2023-03-28 NOTE — H&P (Signed)
PREOPERATIVE H&P  Chief Complaint: Right leg pain  HPI: Jessica [name] is a 72 y.o. female who presents with ongoing pain in the right leg  MRI reveals severe stenosis and instability at L4/5  Patient has failed multiple forms of conservative care and continues to have pain (see office notes for additional details regarding the patient's full course of treatment)  Past Medical History:  Diagnosis Date   Allergic rhinitis    seasonal   Anemia    Arthritis    GERD (gastroesophageal reflux disease)    Hypertension    Pain in shoulder    right side   SOB (shortness of breath) on exertion    Past Surgical History:  Procedure Laterality Date   BREAST SURGERY Left 1988   lumpectomy with implant   COLONOSCOPY  04/24/2001   Dr Juanda Chance, due  01/2012   EYE SURGERY Bilateral    cataract   REVERSE SHOULDER ARTHROPLASTY Left 06/17/2020   Procedure: REVERSE SHOULDER ARTHROPLASTY;  [name], MD;  [name];  Service: Orthopedics;  Laterality: Left;    SHOULDER ARTHROSCOPY WITH OPEN ROTATOR CUFF REPAIR AND DISTAL CLAVICLE ACROMINECTOMY Right 10/09/2013   Procedure: RIGHT SHOULDER ARTHROSCOPY, SUBACROMIAL DECOMPRESSION, DISTAL CLAVICLE RESECTION, ROTATOR CUFF REPAIR  AND POSSIBLE OPEN ROTATOR CUFF REPAIR ;  [name], MD;  [name];  Service: Orthopedics;  Laterality: Right;   TUBAL LIGATION     Social History   Socioeconomic History   Marital status: Married    Spouse name: Not on file   Number of children: Not on file   Years of education: Not on file   Highest education level: Not on file  Occupational History   Not on file  Tobacco Use   Smoking status: Former    Current packs/day: 0.00    Types: Cigarettes    Quit date: 04/24/1976    Years since quitting: 46.9   Smokeless tobacco: Never   Tobacco comments:    smoked [age], up to 1ppd  Vaping Use   Vaping status: Never Used  Substance and Sexual Activity   Alcohol use:  Yes    Comment: wine-2-3 days per week    Drug use: No   Sexual activity: Not on file  Other Topics Concern   Not on file  Social History Narrative   Not on file   Social Determinants of Health   Financial Resource Strain: Not on file  Food Insecurity: Not on file  Transportation Needs: Not on file  Physical Activity: Not on file  Stress: Not on file  Social Connections: Not on file   Family History  Problem Relation Age of Onset   Stroke Mother 31   Dementia Mother    Stroke Father 68   Heart disease Brother 57       LAD stenting   Colon cancer Maternal Uncle    Diabetes Son        Type 1   Prostate cancer Maternal Uncle    Stroke Paternal Grandmother        mid 60s   Hypertension Paternal Grandmother    Allergies  Allergen Reactions   Demerol  [Meperidine] Nausea And Vomiting   Metoprolol Nausea Only    Nausea and inability to increase heart rate with cardiovascular exercise   Oxycodone-Acetaminophen Nausea Only   Sulfa Antibiotics Nausea And Vomiting   Prior to Admission medications   Medication Sig Start Date End Date Taking? Authorizing Provider  ALPRAZolam (XANAX) 1 MG tablet TAKE 1 TABLET TO   ONE AND ONE-HALF TABLETS AT BEDTIME AS NEEDED FOR ANXIETY 12/21/22  Yes Cottle, Steva Ready., MD  cyclobenzaprine (FLEXERIL) 10 MG tablet Take 10 mg by mouth 3 (three) times daily as needed for muscle spasms.   Yes [provider]  FLUoxetine (PROZAC) 20 MG capsule Take 1 capsule (20 mg total) by mouth daily. 05/11/22  Yes Cottle, Steva Ready., MD  hydrochlorothiazide (MICROZIDE) 12.5 MG capsule Take 12.5 mg by mouth daily. 12/28/22  Yes [provider]  methylphenidate (CONCERTA) 36 MG PO CR tablet Take 2 tablets (72 mg total) by mouth daily. Patient taking differently: Take 72 mg by mouth daily as needed (adhd). 12/21/22  Yes Cottle, Steva Ready., MD  olmesartan (BENICAR) 40 MG tablet Take 40 mg by mouth daily.   Yes [provider]  omeprazole  (PRILOSEC) 20 MG capsule Take 20 mg by mouth daily as needed (acid reflux). 08/29/22  Yes [provider]  psyllium (HYDROCIL/METAMUCIL) 95 % PACK Take 1 packet by mouth daily as needed for mild constipation.   Yes [provider]  rosuvastatin (CRESTOR) 10 MG tablet Take 10 mg by mouth at bedtime. 08/04/19  Yes [provider]  Acetylcysteine 600 MG CAPS Take 1 capsule (600 mg total) by mouth daily. Patient not taking: Reported on 03/15/2023 04/06/20   Lauraine Rinne., MD  DULoxetine (CYMBALTA) 30 MG capsule Take 3 capsules (90 mg total) by mouth daily. 1 daily for 1 week then 2 daily for 2 weeks then 3 daily Patient not taking: Reported on 03/15/2023 01/15/23   Lauraine Rinne., MD  influenza vaccine adjuvanted (FLUAD QUADRIVALENT) 0.5 ML injection Inject into the muscle. Patient not taking: Reported on 03/15/2023 01/16/22     NASONEX 50 MCG/ACT nasal spray PLACE 2 SPRAYS INTO THE NOSE DAILY AS NEEDED. Patient not taking: Reported on 03/15/2023 12/23/14   Pecola Lawless, MD  Zoster Vaccine Adjuvanted [facility]) injection Inject into the muscle. Patient not taking: Reported on 03/15/2023 02/07/21        All other systems have been reviewed and were otherwise negative with the exception of those mentioned in the HPI and as above.  Physical Exam: Vitals:   03/28/23 0715  BP: (!) 146/97  Pulse: 83  Resp: 18  Temp: 98 F (36.7 C)  SpO2: 97%    Body mass index is 19.49 kg/m.  General: Alert, no acute distress Cardiovascular: No pedal edema Respiratory: No cyanosis, no use of accessory musculature Skin: No lesions in the area of chief complaint Neurologic: Sensation intact distally Psychiatric: Patient is competent for consent with normal mood and affect Lymphatic: No axillary or cervical lymphadenopathy   Assessment/Plan: RIGHT-SIDED LUMBAR RADICULOPATHY DUE TO STENOSIS AND INSTABILITY AT L4/5 Plan for Procedure(s): RIGHT-SIDED LUMBAR 4- LUMBAR 5  TRANSFORAMINAL LUMBAR INTERBODY FUSION AND DECOMPRESSION WITH INSTRUMENTATION AND ALLOGRAFT   Jackelyn Hoehn, MD 03/28/2023 8:27 AM

## 2023-03-28 NOTE — Op Note (Signed)
PATIENT NAME: Jessica [name]   MEDICAL RECORD NO.:   [id number]   DATE OF BIRTH: 26-Nov-1950   DATE OF PROCEDURE: 03/28/2023                               OPERATIVE REPORT     PREOPERATIVE DIAGNOSES: 1. Right-sided lumbar radiculopathy 2. L4-5 spinal stenosis 3. L4-5 spondylolisthesis   POSTOPERATIVE DIAGNOSES: 1. Right-sided lumbar radiculopathy 2. L4-5 spinal stenosis 3. L4-5 spondylolisthesis   PROCEDURES: 1. L4/5 decompression 2. Right-sided L4-5 transforaminal lumbar interbody fusion. 3. Left-sided L4-5 posterolateral fusion. 4. Insertion of interbody device x1 (Globus expandable intervertebral spacer). 5. Placement of segmental posterior instrumentation L4, L5 bilaterally  6. Use of local autograft. 7. Use of morselized allograft - Vivigen 8. Intraoperative use of fluoroscopy.   SURGEON:  Estill Bamberg, MD.   ASSISTANTJason Coop, PA-C.   ANESTHESIA:  General endotracheal anesthesia.   COMPLICATIONS:  None.   DISPOSITION:  Stable.   ESTIMATED BLOOD LOSS:  250cc   INDICATIONS FOR SURGERY:  Briefly,  Ms. Viado is a pleasant 72 year old female who did present to me with severe and ongoing pain in the right leg. I did feel that the symptoms were secondary to the findings noted above. The patient failed conservative care and did wish to proceed with the procedure  noted above.   OPERATIVE DETAILS:  On 03/28/2023, the patient was brought to surgery and general endotracheal anesthesia was administered.  The patient was placed prone on a well-padded flat Jackson bed with a spinal frame.  Antibiotics were given and a time-out procedure was performed. The back was prepped and draped in the usual fashion.  A midline incision was made overlying the L4-5 intervertebral spaces.  The fascia was incised at the midline.  The paraspinal musculature was bluntly swept laterally.  Anatomic landmarks for the pedicles were exposed. Using fluoroscopy, I did cannulate the L4 and L5  pedicles bilaterally, using a medial to lateral cortical trajectory technique.  At this point, 6 x 30 mm screws were placed into the left pedicles, and a 300 mm rod was placed into the tulip heads of the screw, and caps were also placed.  Distraction was then applied across the L4-5 intervertebral space, and the caps were then provisionally tightened.  On the right side, bone wax was placed into the cannulated pedicle holes.  I then proceeded with the decompressive aspect of the procedure at the L4-5 level.  A partial facetectomy was performed bilaterally at L4-5, decompressing the L4-5 intervertebral space.  I was very pleased with the decompression. With an assistant holding medial retraction of the traversing right L5 nerve, I did perform an annulotomy at the posterolateral aspect of the L4-5 intervertebral space.  I then used a series of curettes and pituitary rongeurs to perform a thorough and complete intervertebral diskectomy.  The intervertebral space was then liberally packed with autograft as well as allograft in the form of Vivigen, as was the appropriate-sized intervertebral spacer.  The spacer was then tamped into position in the usual fashion, and expanded to 8.1 mm in height. I was very pleased with the press-fit of the spacer.  I then turned my attention towards placement of the screws on the right side.  The L5 screw was placed uneventfully.  I then placed the L4 screw, however, I did not feel that the purchase was adequate.  The patient was clearly noted to have soft bone,  and I did feel that an additional screw trajectory would be needed.  I then cannulated the L4 pedicle using a lateral to medial standard trajectory technique.  I tapped up to a 6 mm tap, and elected to place a 6 x 35 mm screw at L4 on the right.  Reasonable purchase was noted.  A 30 mm rod was placed, following this, caps were placed and a final locking procedure was performed on the right and left sides.  Prior to this,  distraction was released on the contralateral side. The wound was copiously irrigated with a total of approximately 3 L prior to placing the bone graft.  Additional autograft and allograft was then packed into the posterolateral gutter on the left side to help aid in the success of the fusion.  The wound was  explored for any undue bleeding and there was no substantial bleeding encountered.  Gel-Foam was placed over the laminectomy site.  The wound was then closed in layers using #1 Vicryl followed by 2-0 Vicryl, followed by 4-0 Monocryl.  Benzoin and Steri-Strips were applied followed by sterile dressing.     Of note, Jason Coop was my assistant throughout surgery, and did aid in retraction, suctioning, the decompression, placement of the hardware, and closure.       Estill Bamberg, MD

## 2023-03-28 NOTE — Transfer of Care (Signed)
Immediate Anesthesia Transfer of Care Note  Patient: Jessica [name]  Procedure(s) Performed: RIGHT-SIDED LUMBAR 4- LUMBAR 5 TRANSFORAMINAL LUMBAR INTERBODY FUSION AND DECOMPRESSION WITH INSTRUMENTATION AND ALLOGRAFT (Right: Spine Lumbar)  Patient Location: PACU  Anesthesia Type:General  Level of Consciousness: drowsy  Airway & Oxygen Therapy: Patient Spontanous Breathing and Patient connected to nasal cannula oxygen  Post-op Assessment: Report given to RN and Post -op Vital signs reviewed and stable  Post vital signs: Reviewed and stable  Last Vitals:  Vitals Value Taken Time  BP 117/67 03/28/23 1304  Temp    Pulse 85 03/28/23 1305  Resp 12 03/28/23 1305  SpO2 91 % 03/28/23 1305  Vitals shown include unfiled device data.  Last Pain:  Vitals:   03/28/23 0733  TempSrc:   PainSc: 7          Complications: No notable events documented.

## 2023-03-28 NOTE — Anesthesia Procedure Notes (Signed)
Procedure Name: Intubation Date/Time: 03/28/2023 9:05 AM  Performed by: Halina Andreas, CRNAPre-anesthesia Checklist: Patient identified, Emergency Drugs available, Suction available, Patient being monitored and Timeout performed Patient Re-evaluated:Patient Re-evaluated prior to induction Oxygen Delivery Method: Circle system utilized Preoxygenation: Pre-oxygenation with 100% oxygen Induction Type: IV induction Ventilation: Mask ventilation without difficulty Laryngoscope Size: Glidescope and 3 Grade View: Grade I Tube type: Oral Tube size: 7.0 mm Number of attempts: 1 Airway Equipment and Method: Stylet Placement Confirmation: ETT inserted through vocal cords under direct vision, positive ETCO2 and breath sounds checked- equal and bilateral Secured at: 22 cm Tube secured with: Tape Dental Injury: Teeth and Oropharynx as per pre-operative assessment

## 2023-03-28 NOTE — Anesthesia Postprocedure Evaluation (Signed)
Anesthesia Post Note  Patient: Jessica [name]  Procedure(s) Performed: RIGHT-SIDED LUMBAR 4- LUMBAR 5 TRANSFORAMINAL LUMBAR INTERBODY FUSION AND DECOMPRESSION WITH INSTRUMENTATION AND ALLOGRAFT (Right: Spine Lumbar)     Patient location during evaluation: PACU Anesthesia Type: General Level of consciousness: awake and alert Pain management: pain level controlled Vital Signs Assessment: post-procedure vital signs reviewed and stable Respiratory status: spontaneous breathing, nonlabored ventilation and respiratory function stable Cardiovascular status: blood pressure returned to baseline and stable Postop Assessment: no apparent nausea or vomiting Anesthetic complications: no   No notable events documented.  Last Vitals:  Vitals:   03/28/23 1315 03/28/23 1330  BP: 127/78 119/67  Pulse: 84 85  Resp: 10 10  Temp:    SpO2: 90% 93%    Last Pain:  Vitals:   03/28/23 1400  TempSrc:   PainSc: Asleep                 Lowella Curb

## 2023-03-29 DIAGNOSIS — M48061 Spinal stenosis, lumbar region without neurogenic claudication: Secondary | ICD-10-CM | POA: Diagnosis not present

## 2023-03-29 DIAGNOSIS — K219 Gastro-esophageal reflux disease without esophagitis: Secondary | ICD-10-CM | POA: Diagnosis not present

## 2023-03-29 DIAGNOSIS — M4316 Spondylolisthesis, lumbar region: Secondary | ICD-10-CM | POA: Diagnosis not present

## 2023-03-29 DIAGNOSIS — Z87891 Personal history of nicotine dependence: Secondary | ICD-10-CM | POA: Diagnosis not present

## 2023-03-29 DIAGNOSIS — I10 Essential (primary) hypertension: Secondary | ICD-10-CM | POA: Diagnosis not present

## 2023-03-29 DIAGNOSIS — M5416 Radiculopathy, lumbar region: Secondary | ICD-10-CM | POA: Diagnosis not present

## 2023-03-29 MED ORDER — TAMSULOSIN HCL 0.4 MG PO CAPS: 0.4000 mg | ORAL_CAPSULE | Freq: Every day | ORAL | Status: DC

## 2023-03-29 MED ORDER — OXYCODONE-ACETAMINOPHEN 5-325 MG PO TABS: 1.0000 | ORAL_TABLET | ORAL | 0 refills | Status: DC | PRN

## 2023-03-29 MED ORDER — TRAMADOL HCL 50 MG PO TABS: 50.0000 mg | ORAL_TABLET | Freq: Four times a day (QID) | ORAL | 0 refills | Status: AC | PRN

## 2023-03-29 MED ORDER — TAMSULOSIN HCL 0.4 MG PO CAPS
0.8000 mg | ORAL_CAPSULE | Freq: Once | ORAL | Status: AC
  Administered 2023-03-29: 0.8 mg via ORAL
  Filled 2023-03-29: qty 2

## 2023-03-29 NOTE — Progress Notes (Signed)
    Patient doing well  Patient denies leg pain Has been ambulating Has not spontaneously voided yet on her own   Physical Exam: Vitals:   03/29/23 0340 03/29/23 0729  BP: 113/85 133/86  Pulse: 82 85  Resp: 18 16  Temp: 98.4 F (36.9 C) 99.1 F (37.3 C)  SpO2: 97% 99%    Dressing in place NVI  POD #1 s/p L4/5 decompression and fusion, doing well.  Will follow bladder function.  [name] bilateral lower extremity strength and sensation is entirely normal.  Cauda equina syndrome is exceedingly unlikely.  - up with PT/OT, encourage ambulation - Percocet for pain, Robaxin for muscle spasms - d/c home today with f/u in 2 weeks as long as patient voids on her own.  Flomax has been started to assist with this.

## 2023-03-29 NOTE — Evaluation (Signed)
Occupational Therapy Evaluation Patient Details Name: Jessica [name] MRN: [id number] DOB: Sep 27, 1950 Today's Date: 03/29/2023   History of Present Illness Jessica [name] is a 72 yo female who underwent L4/5 decompression and fusion 12/4. PMHx: anemia, arthritis, GERD, HTN, SOB   Clinical Impression   [name] was evaluated s/p the above spine surgery. She is indep and lives with family at baseline. Upon evaluation pt was limited by nausea, inability to spontaneously void, surgical pain, spinal precautions and limited activity tolerance. Overall she demonstrated mod I ability to complete BADLs and mobility with RW. Provided cues and education on spinal precautions and compensatory techniques throughout, handout provided and pt demonstrated great recall during ADLs and mobility. Pt does not require further acute OT services. Recommend d/c home with support of family.         If plan is discharge home, recommend the following: Assistance with cooking/housework;Assist for transportation    Functional Status Assessment  Patient has had a recent decline in their functional status and demonstrates the ability to make significant improvements in function in a reasonable and predictable amount of time.  Equipment Recommendations  Other (comment) (RW)       Precautions / Restrictions Precautions Precautions: Back Precaution Booklet Issued: Yes (comment) Required Braces or Orthoses: Spinal Brace Spinal Brace: Thoracolumbosacral orthotic;Applied in sitting position Restrictions Weight Bearing Restrictions: No      Mobility Bed Mobility Overal bed mobility: Needs Assistance Bed Mobility: Rolling, Sit to Sidelying Rolling: Modified independent (Device/Increase time)       Sit to sidelying: Modified independent (Device/Increase time)      Transfers Overall transfer level: Modified independent Equipment used: Rolling walker (2 wheels)                      Balance Overall balance  assessment: No apparent balance deficits (not formally assessed)             ADL either performed or assessed with clinical judgement   ADL Overall ADL's : Needs assistance/impaired         General ADL Comments: pt limited by nausea however is able to complete ADLs with mod I after review of spinal precautions. RW used whem ambulating     Vision Baseline Vision/History: 0 No visual deficits Vision Assessment?: No apparent visual deficits     Perception Perception: Within Functional Limits       Praxis Praxis: WFL       Pertinent Vitals/Pain Pain Assessment Pain Assessment: Faces Faces Pain Scale: Hurts a little bit Pain Location: back / nausea Pain Descriptors / Indicators: Discomfort, Grimacing Pain Intervention(s): Limited activity within patient's tolerance, Monitored during session     Extremity/Trunk Assessment Upper Extremity Assessment Upper Extremity Assessment: Overall WFL for tasks assessed   Lower Extremity Assessment Lower Extremity Assessment: Defer to PT evaluation   Cervical / Trunk Assessment Cervical / Trunk Assessment: Back Surgery   Communication Communication Communication: No apparent difficulties   Cognition Arousal: Alert Behavior During Therapy: WFL for tasks assessed/performed Overall Cognitive Status: Within Functional Limits for tasks assessed             General Comments  VSS, pt continues to feel nauseous and is unable to void on her own            Home Living Family/patient expects to be discharged to:: Private residence Living Arrangements: Spouse/significant other;Children Available Help at Discharge: Family;Available 24 hours/day (Husband and Son) Type of Home: House Home Access: Stairs to enter Entrance  Stairs-Number of Steps: 3-4 Entrance Stairs-Rails: Left Home Layout: Two level;Bed/bath upstairs Alternate Level Stairs-Number of Steps: flight Alternate Level Stairs-Rails: Left Bathroom Shower/Tub:  Chief Strategy Officer: Standard     Home Equipment: None          Prior Functioning/Environment Prior Level of Function : Independent/Modified Independent             Mobility Comments: no AD ADLs Comments: indep        OT Problem List: Impaired balance (sitting and/or standing);Pain;Decreased activity tolerance      OT Treatment/Interventions: Self-care/ADL training;DME and/or AE instruction;Therapeutic activities;Patient/family education;Balance training    OT Goals(Current goals can be found in the care plan section) Acute Rehab OT Goals Patient Stated Goal: to urinate OT Goal Formulation: With patient Time For Goal Achievement: 03/29/23 Potential to Achieve Goals: Good  OT Frequency: Min 1X/week       AM-PAC OT "6 Clicks" Daily Activity     Outcome Measure Help from another person eating meals?: None Help from another person taking care of personal grooming?: None Help from another person toileting, which includes using toliet, bedpan, or urinal?: None (simulated) Help from another person bathing (including washing, rinsing, drying)?: None Help from another person to put on and taking off regular upper body clothing?: None Help from another person to put on and taking off regular lower body clothing?: None 6 Click Score: 24   End of Session Equipment Utilized During Treatment: Rolling walker (2 wheels);Back brace Nurse Communication: Mobility status (nausea)  Activity Tolerance: Patient tolerated treatment well Patient left: in bed;with call bell/phone within reach  OT Visit Diagnosis: Muscle weakness (generalized) (M62.81);Pain                Time: 0981-1914 OT Time Calculation (min): 15 min Charges:  OT General Charges $OT Visit: 1 Visit OT Evaluation $OT Eval Low Complexity: 1 Low  Derenda Mis, OTR/L Acute Rehabilitation Services Office [phone] Secure Chat Communication Preferred   Donia Pounds 03/29/2023, 9:53 AM

## 2023-03-29 NOTE — Progress Notes (Signed)
 Patient alert and oriented, voiding adequately, skin clean, dry and intact without evidence of skin break down, or symptoms of complications - no redness or edema noted, only slight tenderness at site.  Patient states pain is manageable at time of discharge. Room was checked and accounted for all patient's belongings; discharge instructions concerning her medications, incision care, follow up appointment and when to call the doctor as needed were all discussed with patient by RN and she expressed understanding on the instructions given.

## 2023-03-29 NOTE — Progress Notes (Signed)
I had just received communication from the patient's nurse, [name], that she has spontaneously voided.  Her pain is well-controlled.  She will be discharged home this afternoon with follow-up in 2 weeks.

## 2023-03-29 NOTE — Evaluation (Signed)
Physical Therapy Evaluation  Patient Details Name: Jessica [name] MRN: [id number] DOB: 09-04-1950 Today's Date: 03/29/2023  History of Present Illness  Pt is a 72 y/o female who presents s/p L4-L5 TLIF on 03/28/23. PMH significant for anemia, arthritis, HTN, SOB  Clinical Impression  Pt admitted with above diagnosis. At the time of PT eval, pt was able to demonstrate transfers and ambulation with up to CGA and RW for support. Pt was educated on precautions, brace application/wearing schedule, appropriate activity progression, and car transfer. Pt currently with functional limitations due to the deficits listed below (see PT Problem List). Pt will benefit from skilled PT to increase their independence and safety with mobility to allow discharge to the venue listed below.          If plan is discharge home, recommend the following: A little help with walking and/or transfers;A little help with bathing/dressing/bathroom;Assistance with cooking/housework;Assist for transportation;Help with stairs or ramp for entrance   Can travel by private vehicle        Equipment Recommendations Rolling walker (2 wheels)  Recommendations for Other Services       Functional Status Assessment Patient has had a recent decline in their functional status and demonstrates the ability to make significant improvements in function in a reasonable and predictable amount of time.     Precautions / Restrictions Precautions Precautions: Back Precaution Booklet Issued: Yes (comment) Precaution Comments: Reviewed handout and pt was cued for precautions during functional mobility. Required Braces or Orthoses: Spinal Brace Spinal Brace: Thoracolumbosacral orthotic;Applied in sitting position Restrictions Weight Bearing Restrictions: No      Mobility  Bed Mobility Overal bed mobility: Modified Independent Bed Mobility: Rolling, Sit to Sidelying           General bed mobility comments: HOB flat and rails lowered  to simulate home environment.    Transfers Overall transfer level: Needs assistance Equipment used: Rolling walker (2 wheels) Transfers: Sit to/from Stand Sit to Stand: Contact guard assist           General transfer comment: VC's for hand placement on seated surface for safety. Close guard for safety with initial stand as first time up with therapy.    Ambulation/Gait Ambulation/Gait assistance: Supervision Gait Distance (Feet): 250 Feet Assistive device: Rolling walker (2 wheels) Gait Pattern/deviations: Step-through pattern, Decreased stride length, Trunk flexed Gait velocity: Decreased Gait velocity interpretation: <1.31 ft/sec, indicative of household ambulator   General Gait Details: VC's for improved posture, closer walker proximity and forward gaze. No overt LOB noted.  Stairs Stairs: Yes Stairs assistance: Contact guard assist Stair Management: One rail Left, Step to pattern, Forwards Number of Stairs: 10 General stair comments: VC's for sequencing and general safety.  Wheelchair Mobility     Tilt Bed    Modified Rankin (Stroke Patients Only)       Balance Overall balance assessment: No apparent balance deficits (not formally assessed)                                           Pertinent Vitals/Pain Pain Assessment Pain Assessment: Faces Faces Pain Scale: Hurts a little bit Pain Location: back Pain Descriptors / Indicators: Discomfort, Grimacing Pain Intervention(s): Limited activity within patient's tolerance, Monitored during session, Repositioned    Home Living Family/patient expects to be discharged to:: Private residence Living Arrangements: Spouse/significant other;Children Available Help at Discharge: Family;Available 24 hours/day (Husband and  Son) Type of Home: House Home Access: Stairs to enter Entrance Stairs-Rails: Left Entrance Stairs-Number of Steps: 3-4 Alternate Level Stairs-Number of Steps: flight Home Layout:  Two level;Bed/bath upstairs Home Equipment: None      Prior Function Prior Level of Function : Independent/Modified Independent             Mobility Comments: no AD ADLs Comments: indep     Extremity/Trunk Assessment   Upper Extremity Assessment Upper Extremity Assessment: Defer to OT evaluation    Lower Extremity Assessment Lower Extremity Assessment: Generalized weakness    Cervical / Trunk Assessment Cervical / Trunk Assessment: Back Surgery  Communication   Communication Communication: No apparent difficulties  Cognition Arousal: Alert Behavior During Therapy: WFL for tasks assessed/performed Overall Cognitive Status: Within Functional Limits for tasks assessed                                          General Comments General comments (skin integrity, edema, etc.): VSS, pt continues to feel nauseous and is unable to void on her own    Exercises     Assessment/Plan    PT Assessment Patient needs continued PT services  PT Problem List Decreased strength;Decreased balance;Decreased activity tolerance;Decreased mobility;Decreased knowledge of use of DME;Decreased safety awareness;Decreased knowledge of precautions;Pain       PT Treatment Interventions DME instruction;Gait training;Functional mobility training;Stair training;Therapeutic activities;Therapeutic exercise;Balance training;Patient/family education    PT Goals (Current goals can be found in the Care Plan section)  Acute Rehab PT Goals Patient Stated Goal: Home at d/c PT Goal Formulation: With patient Time For Goal Achievement: 04/05/23 Potential to Achieve Goals: Good    Frequency Min 1X/week     Co-evaluation               AM-PAC PT "6 Clicks" Mobility  Outcome Measure Help needed turning from your back to your side while in a flat bed without using bedrails?: A Little Help needed moving from lying on your back to sitting on the side of a flat bed without using  bedrails?: A Little Help needed moving to and from a bed to a chair (including a wheelchair)?: A Little Help needed standing up from a chair using your arms (e.g., wheelchair or bedside chair)?: A Little Help needed to walk in hospital room?: A Little Help needed climbing 3-5 steps with a railing? : A Little 6 Click Score: 18    End of Session Equipment Utilized During Treatment: Gait belt;Back brace Activity Tolerance: Patient tolerated treatment well Patient left: in bed;with call bell/phone within reach Nurse Communication: Mobility status PT Visit Diagnosis: Unsteadiness on feet (R26.81);Pain Pain - part of body:  (back)    Time: 4259-5638 PT Time Calculation (min) (ACUTE ONLY): 23 min   Charges:   PT Evaluation $PT Eval Low Complexity: 1 Low PT Treatments $Gait Training: 8-22 mins PT General Charges $$ ACUTE PT VISIT: 1 Visit         Conni Slipper, PT, DPT Acute Rehabilitation Services Secure Chat Preferred Office: [phone]   Jessica [name] 03/29/2023, 10:53 AM

## 2023-03-30 ENCOUNTER — Other Ambulatory Visit (HOSPITAL_COMMUNITY): Payer: Self-pay

## 2023-03-30 ENCOUNTER — Encounter (HOSPITAL_COMMUNITY): Payer: Self-pay | Admitting: Orthopedic Surgery

## 2023-03-30 MED FILL — Thrombin For Soln 20000 Unit: CUTANEOUS | Qty: 1 | Status: AC

## 2023-04-05 NOTE — Discharge Summary (Signed)
Patient ID: ATHALIAH JERGE MRN: [id number] DOB/AGE: [birth year] 72 y.o.  Admit date: 03/28/2023 Discharge date: 03/29/2023  Admission Diagnoses:  Principal Problem:   Radiculopathy   Discharge Diagnoses:  Same  Past Medical History:  Diagnosis Date   Allergic rhinitis    seasonal   Anemia    Arthritis    GERD (gastroesophageal reflux disease)    Hypertension    Pain in shoulder    right side   SOB (shortness of breath) on exertion     Surgeries: Procedure(s): RIGHT-SIDED LUMBAR 4- LUMBAR 5 TRANSFORAMINAL LUMBAR INTERBODY FUSION AND DECOMPRESSION WITH INSTRUMENTATION AND ALLOGRAFT on 03/28/2023   Consultants: None  Discharged Condition: Improved  Hospital Course: Jessica [name] is an 72 y.o. female who was admitted 03/28/2023 for operative treatment of Radiculopathy. Patient has severe unremitting pain that affects sleep, daily activities, and work/hobbies. After pre-op clearance the patient was taken to the operating room on 03/28/2023 and underwent  Procedure(s): RIGHT-SIDED LUMBAR 4- LUMBAR 5 TRANSFORAMINAL LUMBAR INTERBODY FUSION AND DECOMPRESSION WITH INSTRUMENTATION AND ALLOGRAFT.    Patient was given perioperative antibiotics:  Anti-infectives (From admission, onward)    Start     Dose/Rate Route Frequency Ordered Stop   03/28/23 1700  ceFAZolin (ANCEF) IVPB 2g/100 mL premix        2 g 200 mL/hr over 30 Minutes Intravenous Every 8 hours 03/28/23 1457 03/29/23 0822   03/28/23 0645  ceFAZolin (ANCEF) IVPB 2g/100 mL premix        2 g 200 mL/hr over 30 Minutes Intravenous On call to O.R. 03/28/23 0865 03/28/23 7846        Patient was given sequential compression devices, early ambulation to prevent DVT.  Patient benefited maximally from hospital stay and there were no complications.    Recent vital signs: BP 109/75   Pulse 87   Temp 98.3 F (36.8 C) (Oral)   Resp 18   Ht 5\' 3"  (1.6 m)   Wt 49.9 kg   SpO2 97%   BMI 19.49 kg/m    Discharge Medications:    Allergies as of 03/29/2023       Reactions   Demerol  [meperidine] Nausea And Vomiting   Metoprolol Nausea Only   Nausea and inability to increase heart rate with cardiovascular exercise   Oxycodone-acetaminophen Nausea Only   Sulfa Antibiotics Nausea And Vomiting        Medication List     TAKE these medications    Acetylcysteine 600 MG Caps Take 1 capsule (600 mg total) by mouth daily.   ALPRAZolam 1 MG tablet Commonly known as: XANAX TAKE 1 TABLET TO   ONE AND ONE-HALF TABLETS AT BEDTIME AS NEEDED FOR ANXIETY   cyclobenzaprine 10 MG tablet Commonly known as: FLEXERIL Take 10 mg by mouth 3 (three) times daily as needed for muscle spasms.   DULoxetine 30 MG capsule Commonly known as: CYMBALTA Take 3 capsules (90 mg total) by mouth daily. 1 daily for 1 week then 2 daily for 2 weeks then 3 daily   Fluad Quadrivalent 0.5 ML injection Generic drug: influenza vaccine adjuvanted Inject into the muscle.   FLUoxetine 20 MG capsule Commonly known as: PROZAC Take 1 capsule (20 mg total) by mouth daily.   hydrochlorothiazide 12.5 MG capsule Commonly known as: MICROZIDE Take 12.5 mg by mouth daily.   methylphenidate 36 MG CR tablet Commonly known as: Concerta Take 2 tablets (72 mg total) by mouth daily. What changed:  when to take this reasons  to take this   Nasonex 50 MCG/ACT nasal spray Generic drug: mometasone PLACE 2 SPRAYS INTO THE NOSE DAILY AS NEEDED.   olmesartan 40 MG tablet Commonly known as: BENICAR Take 40 mg by mouth daily.   omeprazole 20 MG capsule Commonly known as: PRILOSEC Take 20 mg by mouth daily as needed (acid reflux).   psyllium 95 % Pack Commonly known as: HYDROCIL/METAMUCIL Take 1 packet by mouth daily as needed for mild constipation.   rosuvastatin 10 MG tablet Commonly known as: CRESTOR Take 10 mg by mouth at bedtime.   Shingrix injection Generic drug: Zoster Vaccine Adjuvanted Inject into the muscle.   traMADol 50 MG  tablet Commonly known as: ULTRAM Take 1-2 tablets (50-100 mg total) by mouth every 6 (six) hours as needed for moderate pain (pain score 4-6).        Diagnostic Studies: DG Lumbar Spine 2-3 Views Result Date: 03/28/2023 CLINICAL DATA:  Elective surgery. EXAM: LUMBAR SPINE - 2-3 VIEW COMPARISON:  None Available. FINDINGS: Two fluoroscopic spot views of the lumbar spine obtained in the operating room posterior rod and intrapedicular screw fusion and interbody spacer at L4-L5. Fluoroscopy time 1 minutes 34.9 seconds. Dose 26.13 mGy. IMPRESSION: Intraoperative fluoroscopy during L4-L5 fusion. Electronically Signed   By: Narda Rutherford M.D.   On: 03/28/2023 13:10   DG Lumbar Spine 1 View Result Date: 03/28/2023 CLINICAL DATA:  Elective surgery, intraop. EXAM: LUMBAR SPINE - 1 VIEW COMPARISON:  Preoperative imaging labeling 5 non-rib-bearing lumbar vertebra. FINDINGS: Portable cross-table lateral view of the lumbar spine obtained in the operating room. Surgical instrument localizes posteriorly at the L4 and L5-S1 level. IMPRESSION: Intraoperative localization during lumbar surgery. Electronically Signed   By: Narda Rutherford M.D.   On: 03/28/2023 13:09   DG C-Arm 1-60 Min-No Report Result Date: 03/28/2023 Fluoroscopy was utilized by the requesting physician.  No radiographic interpretation.   DG C-Arm 1-60 Min-No Report Result Date: 03/28/2023 Fluoroscopy was utilized by the requesting physician.  No radiographic interpretation.   DG C-Arm 1-60 Min-No Report Result Date: 03/28/2023 Fluoroscopy was utilized by the requesting physician.  No radiographic interpretation.    Disposition: Discharge disposition: 01-Home or Self Care        POD #1 s/p L4/5 decompression and fusion, doing well.   - Percocet for pain, Robaxin for muscle spasms -Scripts for pain sent to pharmacy electronically  -D/C instructions sheet printed and in chart -D/C today  -F/U in office 2 weeks   Signed: Georga Bora 04/05/2023, 12:24 PM

## 2023-05-04 ENCOUNTER — Other Ambulatory Visit: Payer: Self-pay | Admitting: Orthopedic Surgery

## 2023-05-04 ENCOUNTER — Ambulatory Visit
Admission: RE | Admit: 2023-05-04 | Discharge: 2023-05-04 | Disposition: A | Discharge status: Home / Self Care | Payer: Medicare Other | Source: Ambulatory Visit | Attending: Orthopedic Surgery | Admitting: Orthopedic Surgery

## 2023-05-04 DIAGNOSIS — M5416 Radiculopathy, lumbar region: Secondary | ICD-10-CM | POA: Diagnosis not present

## 2023-05-04 DIAGNOSIS — M47816 Spondylosis without myelopathy or radiculopathy, lumbar region: Secondary | ICD-10-CM | POA: Diagnosis not present

## 2023-05-04 DIAGNOSIS — Z981 Arthrodesis status: Secondary | ICD-10-CM | POA: Diagnosis not present

## 2023-05-04 DIAGNOSIS — M545 Low back pain, unspecified: Secondary | ICD-10-CM

## 2023-05-04 DIAGNOSIS — M5126 Other intervertebral disc displacement, lumbar region: Secondary | ICD-10-CM | POA: Diagnosis not present

## 2023-05-07 ENCOUNTER — Telehealth: Payer: Self-pay | Admitting: Psychiatry

## 2023-05-07 DIAGNOSIS — F325 Major depressive disorder, single episode, in full remission: Secondary | ICD-10-CM

## 2023-05-07 DIAGNOSIS — M545 Low back pain, unspecified: Secondary | ICD-10-CM | POA: Diagnosis not present

## 2023-05-07 MED ORDER — FLUOXETINE HCL 20 MG PO CAPS: 20.0000 mg | ORAL_CAPSULE | Freq: Every day | ORAL | 0 refills | Status: DC

## 2023-05-07 NOTE — Telephone Encounter (Signed)
 Sent Rx for Prozac

## 2023-05-07 NOTE — Telephone Encounter (Signed)
 Pt lvm that she would like a refill on her prozac 20 mg. She would like it sent to the [facility] on [address]

## 2023-05-08 DIAGNOSIS — M5416 Radiculopathy, lumbar region: Secondary | ICD-10-CM | POA: Diagnosis not present

## 2023-05-18 ENCOUNTER — Other Ambulatory Visit: Payer: Self-pay | Admitting: Psychiatry

## 2023-05-18 DIAGNOSIS — F325 Major depressive disorder, single episode, in full remission: Secondary | ICD-10-CM

## 2023-05-22 ENCOUNTER — Other Ambulatory Visit: Payer: Self-pay | Admitting: Psychiatry

## 2023-05-22 DIAGNOSIS — F325 Major depressive disorder, single episode, in full remission: Secondary | ICD-10-CM

## 2023-05-22 DIAGNOSIS — M5416 Radiculopathy, lumbar region: Secondary | ICD-10-CM | POA: Diagnosis not present

## 2023-05-24 DIAGNOSIS — N1831 Chronic kidney disease, stage 3a: Secondary | ICD-10-CM | POA: Diagnosis not present

## 2023-05-24 DIAGNOSIS — I129 Hypertensive chronic kidney disease with stage 1 through stage 4 chronic kidney disease, or unspecified chronic kidney disease: Secondary | ICD-10-CM | POA: Diagnosis not present

## 2023-05-24 DIAGNOSIS — D649 Anemia, unspecified: Secondary | ICD-10-CM | POA: Diagnosis not present

## 2023-05-24 DIAGNOSIS — M810 Age-related osteoporosis without current pathological fracture: Secondary | ICD-10-CM | POA: Diagnosis not present

## 2023-05-24 DIAGNOSIS — H0012 Chalazion right lower eyelid: Secondary | ICD-10-CM | POA: Diagnosis not present

## 2023-05-24 DIAGNOSIS — E785 Hyperlipidemia, unspecified: Secondary | ICD-10-CM | POA: Diagnosis not present

## 2023-05-30 DIAGNOSIS — L738 Other specified follicular disorders: Secondary | ICD-10-CM | POA: Diagnosis not present

## 2023-05-30 DIAGNOSIS — D2272 Melanocytic nevi of left lower limb, including hip: Secondary | ICD-10-CM | POA: Diagnosis not present

## 2023-05-30 DIAGNOSIS — D2262 Melanocytic nevi of left upper limb, including shoulder: Secondary | ICD-10-CM | POA: Diagnosis not present

## 2023-05-30 DIAGNOSIS — L723 Sebaceous cyst: Secondary | ICD-10-CM | POA: Diagnosis not present

## 2023-05-30 DIAGNOSIS — D2261 Melanocytic nevi of right upper limb, including shoulder: Secondary | ICD-10-CM | POA: Diagnosis not present

## 2023-05-30 DIAGNOSIS — L821 Other seborrheic keratosis: Secondary | ICD-10-CM | POA: Diagnosis not present

## 2023-05-30 DIAGNOSIS — D225 Melanocytic nevi of trunk: Secondary | ICD-10-CM | POA: Diagnosis not present

## 2023-05-30 DIAGNOSIS — D692 Other nonthrombocytopenic purpura: Secondary | ICD-10-CM | POA: Diagnosis not present

## 2023-05-30 DIAGNOSIS — D1801 Hemangioma of skin and subcutaneous tissue: Secondary | ICD-10-CM | POA: Diagnosis not present

## 2023-06-07 ENCOUNTER — Encounter: Payer: Self-pay | Admitting: Psychiatry

## 2023-06-07 ENCOUNTER — Ambulatory Visit: Payer: Medicare Other | Admitting: Psychiatry

## 2023-06-07 DIAGNOSIS — F5105 Insomnia due to other mental disorder: Secondary | ICD-10-CM

## 2023-06-07 DIAGNOSIS — F325 Major depressive disorder, single episode, in full remission: Secondary | ICD-10-CM | POA: Diagnosis not present

## 2023-06-07 DIAGNOSIS — F900 Attention-deficit hyperactivity disorder, predominantly inattentive type: Secondary | ICD-10-CM | POA: Diagnosis not present

## 2023-06-07 DIAGNOSIS — M5386 Other specified dorsopathies, lumbar region: Secondary | ICD-10-CM

## 2023-06-07 DIAGNOSIS — M4316 Spondylolisthesis, lumbar region: Secondary | ICD-10-CM | POA: Diagnosis not present

## 2023-06-07 MED ORDER — LITHIUM CARBONATE 150 MG PO CAPS: 150.0000 mg | ORAL_CAPSULE | Freq: Every day | ORAL | 3 refills | Status: AC

## 2023-06-07 MED ORDER — ALPRAZOLAM 1 MG PO TABS: ORAL_TABLET | ORAL | 1 refills | Status: DC

## 2023-06-07 MED ORDER — METHYLPHENIDATE HCL ER (OSM) 36 MG PO TBCR: 72.0000 mg | EXTENDED_RELEASE_TABLET | Freq: Every day | ORAL | 0 refills | Status: DC

## 2023-06-07 MED ORDER — FLUOXETINE HCL 20 MG PO CAPS: 20.0000 mg | ORAL_CAPSULE | Freq: Every day | ORAL | 1 refills | Status: DC

## 2023-06-07 NOTE — Addendum Note (Signed)
Addended by: Kirstie Peri on: 06/07/2023 01:38 PM   Modules accepted: Orders

## 2023-06-07 NOTE — Progress Notes (Addendum)
BE[place] VERNE [id number] 1951-01-20 73 y.o.  Subjective:   Patient ID:  Jessica [name] is a 73 y.o. (DOB 30-Sep-1950) female.  Chief Complaint:  Chief Complaint  Patient presents with   Follow-up    HPI Jessica [name] presents to the office today for follow-up of ADD, anxeity, sleep and depression.  visit June 2020.  She was having some cognitive complaints and it was suggested that she add lithium 150 mg daily for his neuro protective effect as well as N-acetylcysteine 600 mg daily for cognitive benefit.  No other meds were changed.  She called back September 11 stating Wellbutrin was making her dizzy and shaky and she was going to reduce the dose to 150 mg and potentially stop it.  She remained on fluoxetine 20 mg, Concerta 36 mg and alprazolam 1 to 1-1/2 mg nightly as needed insomnia  December 2020 appointment with the following noted: Good overall except son still at home and causing problems. No depression relapse off Wellbutrin.  Stopped it DT tremor which resolved.  Overall satisfied with meds.  Great overall. Tolerating.   No meds were changed.  She continued Concerta 72 mg every morning, fluoxetine 20, lithium 150, alprazolam 1 mg 1 to 1-1/2 tablets nightly for sleep.  10/06/2019 appointment with the following noted:  lithium stopped DT elevated Cr and BUN. Doing OK overall. Continues other meds.  Sleep is OK with Xanax 1 mg hs usually..  No SE. Asked questions about Ketamine Y& TMS for  depression for son.   Son has tried and failed multiple meds Plan no med changes  04/06/20 appt with following noted: Needs Xanax lately regularly for sleep.  Son is going through a lot. Doing OK with meds: Lithium 150, prozac 20, Concerta 72.  NAC.   No SE.    Sees cognitive benefit from lithium and NAC. Good health except indigestion.  Son borderline.   Plan: No med changes  10/19/2020 appointment with the following noted: Still taking Concerta and fluoxetine 20 Just had shoulder  replacement.  Not doing much for enjoyment and son still living at times and doing nothing. Son tried Development worker, international aid and [employer] without help. No SE Prozac.   Patient reports stable mood and denies depressed or irritable moods.  Patient denies any recent difficulty with anxiety.  Patient denies difficulty with sleep initiation or maintenance. Denies appetite disturbance.  Patient reports that energy and motivation have been good.  Patient denies any difficulty with concentration.  Patient denies any suicidal ideation. Needs Xanax for sleep   Usually 1mg  hs.  03/28/21 appt noted: Has not been able to get Concerta for 3 mos bc [facility] said they didn't have it.  Much more scattered without it. Still on fluoxetine 40 mg , Xanax 1-1.5 mg HS,  Typically sleeps well. Feels better on increased Prozac. Plan: Restart Concerta 72 mg every morning because markedly worse ADD over the last 3 months without it. Continue fluoxetine 40 mg and alprazolam 1 to 1-1/2 mg nightly  11/08/2021 appointment with the following noted: Doing ok. Rattled with son still home with OCD and depression.  He's doing ketamine tx. She's satisfied with meds and does better with Concerta 72 but can't always get it. NoSE Sleep fine with Xanax HS usually less than 1.5 mg . Doing ok with less prozac 20 mg now Patient reports stable mood and denies depressed or irritable moods.  Patient denies any recent difficulty with anxiety.  Patient denies difficulty with sleep initiation or maintenance. Denies  appetite disturbance.  Patient reports that energy and motivation have been good.  Patient has worse difficulty with concentration.  Patient denies any suicidal ideation. No med changes  05/11/22 appt noted: Continues meds. Had Covid couple of mos ago. Not taking Prozac and was very irritable.  Better on it. Good.  Pleased with meds.  No SE.   No concerns with Concerta or meds. Usally alprazolam 1 mg HS.   Melatonin SE N  12/21/2022 appointment  noted: Current psych meds: NAC 600 daily, alprazolam 1 mg tablet 1-1/2 tablets nightly, fluoxetine 20 mg daily, concerta 72 mg AM Satisfied with the Concerta for ADD. Duration about 12 hours.   Is forgetful but not sure why.   Sciatica on R.  Shot didn't help.  Sx worse over the last year.  Affected  mood.  However handling it as well as anyone. Needs Xanax to sleep abut it is working. Has been doing PT which did help until shot made it worse again. Plan: continue fluoxetine 20 mg daily helped residual depression vs option switch to duloxetine to see if can get pain benefit.  06/07/23 appt noted: Med: alprazolam 1 mg 1-1&1/2 HS, cyclobenzaprine 10 prn, fluox 20, Concerta 72 AM. Back surgery Dec fusion.  A lot of pain for 5 weeks.  Now 10 weeks post and pain much better. Tramadol not helpful and stopped.   Mood and anxiety are ok now.  No px with Concerta.   No SE. Son still home and stressful.  Hx dep and OCD and took ketamine. Sleep ok.     Past Psychiatric Medication Trials:  Abilify SE,  fluoxetine 40 insomnia Wellbutrin 300,  Lexapro fatigue   Cymbalta SE ins Concerta 72, Adderall SE jittery Xanax hs, trazodone,  Ambien hangover, Melatonin SE N  Son dep and OCD. Less effective Spravato tx.  Review of Systems:  Review of Systems  Cardiovascular:  Negative for palpitations.  Musculoskeletal:  Positive for back pain.  Neurological:  Negative for dizziness, tremors and weakness.  Psychiatric/Behavioral:  Positive for decreased concentration. Negative for agitation, behavioral problems, confusion, dysphoric mood, hallucinations, self-injury, sleep disturbance and suicidal ideas. The patient is not nervous/anxious and is not hyperactive.     Medications: I have reviewed the patient's current medications.  Current Outpatient Medications  Medication Sig Dispense Refill   cyclobenzaprine (FLEXERIL) 10 MG tablet Take 10 mg by mouth 3 (three) times daily as needed for muscle  spasms.     hydrochlorothiazide (MICROZIDE) 12.5 MG capsule Take 12.5 mg by mouth daily.     olmesartan (BENICAR) 40 MG tablet Take 40 mg by mouth daily.     omeprazole (PRILOSEC) 20 MG capsule Take 20 mg by mouth daily as needed (acid reflux).     psyllium (HYDROCIL/METAMUCIL) 95 % PACK Take 1 packet by mouth daily as needed for mild constipation.     rosuvastatin (CRESTOR) 10 MG tablet Take 10 mg by mouth at bedtime.     Acetylcysteine 600 MG CAPS Take 1 capsule (600 mg total) by mouth daily. (Patient not taking: Reported on 03/15/2023) 90 capsule 3   ALPRAZolam (XANAX) 1 MG tablet TAKE 1 TABLET TO   ONE AND ONE-HALF TABLETS AT BEDTIME AS NEEDED FOR ANXIETY 135 tablet 1   FLUoxetine (PROZAC) 20 MG capsule Take 1 capsule (20 mg total) by mouth daily. 90 capsule 1   influenza vaccine adjuvanted (FLUAD QUADRIVALENT) 0.5 ML injection Inject into the muscle. (Patient not taking: Reported on 03/15/2023) 0.5 mL 0  lithium carbonate 150 MG capsule Take 1 capsule (150 mg total) by mouth at bedtime. 90 capsule 3   methylphenidate (CONCERTA) 36 MG PO CR tablet Take 2 tablets (72 mg total) by mouth daily. 180 tablet 0   NASONEX 50 MCG/ACT nasal spray PLACE 2 SPRAYS INTO THE NOSE DAILY AS NEEDED. (Patient not taking: Reported on 03/15/2023) 17 g 5   traMADol (ULTRAM) 50 MG tablet Take 1-2 tablets (50-100 mg total) by mouth every 6 (six) hours as needed for moderate pain (pain score 4-6). (Patient not taking: Reported on 06/07/2023) 60 tablet 0   Zoster Vaccine Adjuvanted [facility]) injection Inject into the muscle. (Patient not taking: Reported on 03/15/2023) 0.5 mL 0   No current facility-administered medications for this visit.    Medication Side Effects: None  Allergies:  Allergies  Allergen Reactions   Demerol  [Meperidine] Nausea And Vomiting   Metoprolol Nausea Only    Nausea and inability to increase heart rate with cardiovascular exercise   Oxycodone-Acetaminophen Nausea Only   Sulfa  Antibiotics Nausea And Vomiting    Past Medical History:  Diagnosis Date   Allergic rhinitis    seasonal   Anemia    Arthritis    GERD (gastroesophageal reflux disease)    Hypertension    Pain in shoulder    right side   SOB (shortness of breath) on exertion     Family History  Problem Relation Age of Onset   Stroke Mother 79   Dementia Mother    Stroke Father 16   Heart disease Brother 11       LAD stenting   Colon cancer Maternal Uncle    Diabetes Son        Type 1   Prostate cancer Maternal Uncle    Stroke Paternal Grandmother        mid 52s   Hypertension Paternal Grandmother     Social History   Socioeconomic History   Marital status: Married    Spouse name: Not on file   Number of children: Not on file   Years of education: Not on file   Highest education level: Not on file  Occupational History   Not on file  Tobacco Use   Smoking status: Former    Current packs/day: 0.00    Types: Cigarettes    Quit date: 04/24/1976    Years since quitting: 47.1   Smokeless tobacco: Never   Tobacco comments:    smoked [age], up to 1ppd  Vaping Use   Vaping status: Never Used  Substance and Sexual Activity   Alcohol use: Yes    Comment: wine-2-3 days per week    Drug use: No   Sexual activity: Not on file  Other Topics Concern   Not on file  Social History Narrative   Not on file   Social Drivers of Health   Financial Resource Strain: Not on file  Food Insecurity: Not on file  Transportation Needs: Not on file  Physical Activity: Not on file  Stress: Not on file  Social Connections: Not on file  Intimate Partner Violence: Not on file    Past Medical History, Surgical history, Social history, and Family history were reviewed and updated as appropriate.   M had dementia onset 59's.  Please see review of systems for further details on the patient's review from today.   Objective:   Physical Exam:  There were no vitals taken for this  visit.  Physical Exam Constitutional:  General: She is not in acute distress. Musculoskeletal:        General: No deformity.  Neurological:     Mental Status: She is alert and oriented to person, place, and time.     Coordination: Coordination normal.     Gait: Gait normal.  Psychiatric:        Attention and Perception: She is inattentive.        Mood and Affect: Mood is not anxious or depressed. Affect is not inappropriate.        Speech: Speech normal. Speech is not slurred.        Behavior: Behavior normal.        Thought Content: Thought content normal. Thought content is not paranoid or delusional. Thought content does not include homicidal or suicidal ideation.        Cognition and Memory: Cognition normal.        Judgment: Judgment normal.     Comments: Insight intact. No auditory or visual hallucinations. No delusions.      Lab Review:     Component Value Date/Time   NA 137 03/28/2023 0733   K 3.6 03/28/2023 0733   CL 103 03/28/2023 0733   CO2 26 03/20/2023 1343   GLUCOSE 86 03/28/2023 0733   BUN 16 03/28/2023 0733   CREATININE 0.90 03/28/2023 0733   CALCIUM 9.3 03/20/2023 1343   PROT 7.3 05/18/2014 1321   ALBUMIN 4.5 05/18/2014 1321   AST 25 05/18/2014 1321   ALT 18 05/18/2014 1321   ALKPHOS 45 05/18/2014 1321   BILITOT 0.5 05/18/2014 1321   GFRNONAA >60 03/20/2023 1343   GFRAA >90 10/07/2013 0840       Component Value Date/Time   WBC 7.7 03/20/2023 1343   RBC 3.45 (L) 03/20/2023 1343   HGB 11.2 (L) 03/28/2023 0733   HCT 33.0 (L) 03/28/2023 0733   PLT 298 03/20/2023 1343   MCV 91.0 03/20/2023 1343   MCH 31.0 03/20/2023 1343   MCHC 34.1 03/20/2023 1343   RDW 11.5 03/20/2023 1343   LYMPHSABS 2.4 05/18/2014 1321   MONOABS 0.4 05/18/2014 1321   EOSABS 0.1 05/18/2014 1321   BASOSABS 0.0 05/18/2014 1321    No results found for: "POCLITH", "LITHIUM"   No results found for: "PHENYTOIN", "PHENOBARB", "VALPROATE", "CBMZ"   .res Assessment: Plan:     Melida was seen today for follow-up.  Diagnoses and all orders for this visit:  Major depression in complete remission (HCC) -     FLUoxetine (PROZAC) 20 MG capsule; Take 1 capsule (20 mg total) by mouth daily. -     lithium carbonate 150 MG capsule; Take 1 capsule (150 mg total) by mouth at bedtime.  Attention deficit hyperactivity disorder (ADHD), predominantly inattentive type -     methylphenidate (CONCERTA) 36 MG PO CR tablet; Take 2 tablets (72 mg total) by mouth daily.  Insomnia due to mental condition -     ALPRAZolam (XANAX) 1 MG tablet; TAKE 1 TABLET TO   ONE AND ONE-HALF TABLETS AT BEDTIME AS NEEDED FOR ANXIETY  Sciatica of right side associated with disorder of lumbar spine     RO MCI  30 min face to face time with patient was spent on counseling and coordination of care  Continue NAC for mild cognitive complaints. Disc Dr. Arlie Solomons article on Lithium and memory DT mother's hx dementia.   She wants to return to lowest dose lithium to help reduce risk demential. Resume NAC (N-acetylcysteine) 600 mg capsule 1 daily for  memory and concentration. Disc recent availability problems.  Disc dealing with med shortages and strategies around this problem.  She has missed the Concerta for 3 months because she was told it was not available.  However other patients are getting it from other pharmacies.  We will switch to [facility].  This is likely to resolve after the new year. Continue Concerta 72 mg every morning because markedly worse ADD over the last 3 months without it.  We discussed the short-term risks associated with benzodiazepines including sedation and increased fall risk among others.  Discussed long-term side effect risk including dependence, potential withdrawal symptoms, and the potential eventual dose-related risk of dementia.  But recent studies from 2020 dispute this association between benzodiazepines and dementia risk. Newer studies in 2020 do not support an association  with dementia. Continue Xanax 1 to 1-1/2 mg nightly for insomnia. Lowest dosage of Bz.  continue fluoxetine 20 mg daily helped residual depression   Fu 6 mos  Meredith Staggers MD, DFAPA  Please see After Visit Summary for patient specific instructions.  Future Appointments  Date Time Provider Department Center  12/05/2023  1:00 PM Cottle, Steva Ready., MD [facility] None       No orders of the defined types were placed in this encounter.     -------------------------------

## 2023-06-11 DIAGNOSIS — H26492 Other secondary cataract, left eye: Secondary | ICD-10-CM | POA: Diagnosis not present

## 2023-06-11 DIAGNOSIS — H0012 Chalazion right lower eyelid: Secondary | ICD-10-CM | POA: Diagnosis not present

## 2023-06-11 DIAGNOSIS — Z961 Presence of intraocular lens: Secondary | ICD-10-CM | POA: Diagnosis not present

## 2023-06-18 DIAGNOSIS — H26492 Other secondary cataract, left eye: Secondary | ICD-10-CM | POA: Diagnosis not present

## 2023-06-21 ENCOUNTER — Other Ambulatory Visit (HOSPITAL_COMMUNITY): Payer: Self-pay | Admitting: *Deleted

## 2023-06-25 ENCOUNTER — Ambulatory Visit (HOSPITAL_COMMUNITY)
Admission: RE | Admit: 2023-06-25 | Discharge: 2023-06-25 | Disposition: A | Discharge status: Home / Self Care | Payer: Medicare Other | Source: Ambulatory Visit | Attending: Internal Medicine | Admitting: Internal Medicine

## 2023-06-25 DIAGNOSIS — M810 Age-related osteoporosis without current pathological fracture: Secondary | ICD-10-CM | POA: Insufficient documentation

## 2023-06-25 DIAGNOSIS — M4316 Spondylolisthesis, lumbar region: Secondary | ICD-10-CM | POA: Diagnosis not present

## 2023-06-25 DIAGNOSIS — M545 Low back pain, unspecified: Secondary | ICD-10-CM | POA: Diagnosis not present

## 2023-06-25 MED ORDER — DENOSUMAB 60 MG/ML ~~LOC~~ SOSY
PREFILLED_SYRINGE | SUBCUTANEOUS | Status: AC
  Filled 2023-06-25: qty 1

## 2023-06-25 MED ORDER — DENOSUMAB 60 MG/ML ~~LOC~~ SOSY
60.0000 mg | PREFILLED_SYRINGE | Freq: Once | SUBCUTANEOUS | Status: AC
  Administered 2023-06-25: 60 mg via SUBCUTANEOUS

## 2023-07-05 DIAGNOSIS — M4316 Spondylolisthesis, lumbar region: Secondary | ICD-10-CM | POA: Diagnosis not present

## 2023-07-27 DIAGNOSIS — R110 Nausea: Secondary | ICD-10-CM | POA: Diagnosis not present

## 2023-07-27 DIAGNOSIS — R051 Acute cough: Secondary | ICD-10-CM | POA: Diagnosis not present

## 2023-07-27 DIAGNOSIS — J069 Acute upper respiratory infection, unspecified: Secondary | ICD-10-CM | POA: Diagnosis not present

## 2023-08-14 DIAGNOSIS — M4316 Spondylolisthesis, lumbar region: Secondary | ICD-10-CM | POA: Diagnosis not present

## 2023-08-16 DIAGNOSIS — M810 Age-related osteoporosis without current pathological fracture: Secondary | ICD-10-CM | POA: Diagnosis not present

## 2023-08-16 DIAGNOSIS — N1831 Chronic kidney disease, stage 3a: Secondary | ICD-10-CM | POA: Diagnosis not present

## 2023-08-16 DIAGNOSIS — E785 Hyperlipidemia, unspecified: Secondary | ICD-10-CM | POA: Diagnosis not present

## 2023-08-16 DIAGNOSIS — I129 Hypertensive chronic kidney disease with stage 1 through stage 4 chronic kidney disease, or unspecified chronic kidney disease: Secondary | ICD-10-CM | POA: Diagnosis not present

## 2023-08-16 DIAGNOSIS — D649 Anemia, unspecified: Secondary | ICD-10-CM | POA: Diagnosis not present

## 2023-08-16 DIAGNOSIS — F418 Other specified anxiety disorders: Secondary | ICD-10-CM | POA: Diagnosis not present

## 2023-08-20 DIAGNOSIS — M810 Age-related osteoporosis without current pathological fracture: Secondary | ICD-10-CM | POA: Diagnosis not present

## 2023-08-23 DIAGNOSIS — Z0000 Encounter for general adult medical examination without abnormal findings: Secondary | ICD-10-CM | POA: Diagnosis not present

## 2023-08-23 DIAGNOSIS — D649 Anemia, unspecified: Secondary | ICD-10-CM | POA: Diagnosis not present

## 2023-08-23 DIAGNOSIS — K219 Gastro-esophageal reflux disease without esophagitis: Secondary | ICD-10-CM | POA: Diagnosis not present

## 2023-08-23 DIAGNOSIS — M5416 Radiculopathy, lumbar region: Secondary | ICD-10-CM | POA: Diagnosis not present

## 2023-08-23 DIAGNOSIS — I10 Essential (primary) hypertension: Secondary | ICD-10-CM | POA: Diagnosis not present

## 2023-08-23 DIAGNOSIS — N1831 Chronic kidney disease, stage 3a: Secondary | ICD-10-CM | POA: Diagnosis not present

## 2023-08-23 DIAGNOSIS — I129 Hypertensive chronic kidney disease with stage 1 through stage 4 chronic kidney disease, or unspecified chronic kidney disease: Secondary | ICD-10-CM | POA: Diagnosis not present

## 2023-08-23 DIAGNOSIS — E785 Hyperlipidemia, unspecified: Secondary | ICD-10-CM | POA: Diagnosis not present

## 2023-08-23 DIAGNOSIS — Z1331 Encounter for screening for depression: Secondary | ICD-10-CM | POA: Diagnosis not present

## 2023-08-23 DIAGNOSIS — R82998 Other abnormal findings in urine: Secondary | ICD-10-CM | POA: Diagnosis not present

## 2023-08-23 DIAGNOSIS — F418 Other specified anxiety disorders: Secondary | ICD-10-CM | POA: Diagnosis not present

## 2023-08-23 DIAGNOSIS — Z1339 Encounter for screening examination for other mental health and behavioral disorders: Secondary | ICD-10-CM | POA: Diagnosis not present

## 2023-08-23 DIAGNOSIS — M810 Age-related osteoporosis without current pathological fracture: Secondary | ICD-10-CM | POA: Diagnosis not present

## 2023-08-24 DIAGNOSIS — M4316 Spondylolisthesis, lumbar region: Secondary | ICD-10-CM | POA: Diagnosis not present

## 2023-08-30 DIAGNOSIS — M4316 Spondylolisthesis, lumbar region: Secondary | ICD-10-CM | POA: Diagnosis not present

## 2023-09-04 DIAGNOSIS — M545 Low back pain, unspecified: Secondary | ICD-10-CM | POA: Diagnosis not present

## 2023-09-10 DIAGNOSIS — M4316 Spondylolisthesis, lumbar region: Secondary | ICD-10-CM | POA: Diagnosis not present

## 2023-10-07 ENCOUNTER — Other Ambulatory Visit: Payer: Self-pay | Admitting: Psychiatry

## 2023-10-07 DIAGNOSIS — F325 Major depressive disorder, single episode, in full remission: Secondary | ICD-10-CM

## 2023-10-10 DIAGNOSIS — L821 Other seborrheic keratosis: Secondary | ICD-10-CM | POA: Diagnosis not present

## 2023-10-10 DIAGNOSIS — L738 Other specified follicular disorders: Secondary | ICD-10-CM | POA: Diagnosis not present

## 2023-11-15 DIAGNOSIS — L309 Dermatitis, unspecified: Secondary | ICD-10-CM | POA: Diagnosis not present

## 2023-11-15 DIAGNOSIS — D485 Neoplasm of uncertain behavior of skin: Secondary | ICD-10-CM | POA: Diagnosis not present

## 2023-11-15 DIAGNOSIS — L920 Granuloma annulare: Secondary | ICD-10-CM | POA: Diagnosis not present

## 2023-12-05 ENCOUNTER — Ambulatory Visit (INDEPENDENT_AMBULATORY_CARE_PROVIDER_SITE_OTHER): Payer: Medicare Other | Admitting: Psychiatry

## 2023-12-05 DIAGNOSIS — Z91199 Patient's noncompliance with other medical treatment and regimen due to unspecified reason: Secondary | ICD-10-CM

## 2023-12-05 NOTE — Progress Notes (Signed)
 noshow

## 2023-12-10 ENCOUNTER — Telehealth (HOSPITAL_COMMUNITY): Payer: Self-pay | Admitting: Pharmacy Technician

## 2023-12-10 NOTE — Telephone Encounter (Signed)
 Auth Submission: NO AUTH NEEDED Site of care: [facility] Payer: Medicare A/B, Tricare for Life Medication & CPT/J Code(s) submitted: Prolia  (Denosumab ) [id number] Diagnosis Code: M81.0 Route of submission (phone, fax, portal):  Phone # Fax # Auth type: Buy/Bill HB Units/visits requested: 60mg  x 2 doses, q 6 months Reference number:  Approval from: 12/10/23 to 05/24/24    Dagoberto Armour, CPhT [facility] Phone: [phone] 12/10/2023

## 2023-12-13 DIAGNOSIS — Z1231 Encounter for screening mammogram for malignant neoplasm of breast: Secondary | ICD-10-CM | POA: Diagnosis not present

## 2023-12-13 DIAGNOSIS — Z124 Encounter for screening for malignant neoplasm of cervix: Secondary | ICD-10-CM | POA: Diagnosis not present

## 2023-12-14 DIAGNOSIS — H35371 Puckering of macula, right eye: Secondary | ICD-10-CM | POA: Diagnosis not present

## 2023-12-14 DIAGNOSIS — Z961 Presence of intraocular lens: Secondary | ICD-10-CM | POA: Diagnosis not present

## 2023-12-14 DIAGNOSIS — H43813 Vitreous degeneration, bilateral: Secondary | ICD-10-CM | POA: Diagnosis not present

## 2023-12-26 ENCOUNTER — Encounter: Payer: Self-pay | Admitting: Psychiatry

## 2023-12-26 ENCOUNTER — Other Ambulatory Visit (HOSPITAL_COMMUNITY): Payer: Self-pay | Admitting: *Deleted

## 2023-12-26 ENCOUNTER — Ambulatory Visit: Admitting: Psychiatry

## 2023-12-26 DIAGNOSIS — F325 Major depressive disorder, single episode, in full remission: Secondary | ICD-10-CM

## 2023-12-26 DIAGNOSIS — F900 Attention-deficit hyperactivity disorder, predominantly inattentive type: Secondary | ICD-10-CM

## 2023-12-26 DIAGNOSIS — F3342 Major depressive disorder, recurrent, in full remission: Secondary | ICD-10-CM | POA: Diagnosis not present

## 2023-12-26 DIAGNOSIS — F5105 Insomnia due to other mental disorder: Secondary | ICD-10-CM

## 2023-12-26 MED ORDER — METHYLPHENIDATE HCL ER (OSM) 36 MG PO TBCR: 72.0000 mg | EXTENDED_RELEASE_TABLET | Freq: Every day | ORAL | 0 refills | Status: DC

## 2023-12-26 MED ORDER — ALPRAZOLAM 1 MG PO TABS: ORAL_TABLET | ORAL | 1 refills | Status: AC

## 2023-12-26 MED ORDER — FLUOXETINE HCL 20 MG PO CAPS: 20.0000 mg | ORAL_CAPSULE | Freq: Every day | ORAL | 1 refills | Status: AC

## 2023-12-26 NOTE — Progress Notes (Signed)
 Jessica [name] [id number] Dec 13, 1950 73 y.o.  Subjective:   Patient ID:  Jessica [name] is a 73 y.o. (DOB 1950-05-10) female.  Chief Complaint:  Chief Complaint  Patient presents with   Follow-up   Depression   Anxiety    HPI AREYA LEMMERMAN presents to the office today for follow-up of ADD, anxeity, sleep and depression.  visit June 2020.  She was having some cognitive complaints and it was suggested that she add lithium  150 mg daily for his neuro protective effect as well as N-acetylcysteine  600 mg daily for cognitive benefit.  No other meds were changed.  She called back September 11 stating Wellbutrin  was making her dizzy and shaky and she was going to reduce the dose to 150 mg and potentially stop it.  She remained on fluoxetine  20 mg, Concerta  36 mg and alprazolam  1 to 1-1/2 mg nightly as needed insomnia  December 2020 appointment with the following noted: Good overall except son still at home and causing problems. No depression relapse off Wellbutrin .  Stopped it DT tremor which resolved.  Overall satisfied with meds.  Great overall. Tolerating.   No meds were changed.  She continued Concerta  72 mg every morning, fluoxetine  20, lithium  150, alprazolam  1 mg 1 to 1-1/2 tablets nightly for sleep.  10/06/2019 appointment with the following noted:  lithium  stopped DT elevated Cr and BUN. Doing OK overall. Continues other meds.  Sleep is OK with Xanax  1 mg hs usually..  No SE. Asked questions about Ketamine Y& TMS for  depression for son.   Son has tried and failed multiple meds Plan no med changes  04/06/20 appt with following noted: Needs Xanax  lately regularly for sleep.  Son is going through a lot. Doing OK with meds: Lithium  150, prozac  20, Concerta  72.  NAC.   No SE.    Sees cognitive benefit from lithium  and NAC. Good health except indigestion.  Son borderline.   Plan: No med changes  10/19/2020 appointment with the following noted: Still taking Concerta  and fluoxetine   20 Just had shoulder replacement.  Not doing much for enjoyment and son still living at times and doing nothing. Son tried Spravato and [employer] without help. No SE Prozac .   Patient reports stable mood and denies depressed or irritable moods.  Patient denies any recent difficulty with anxiety.  Patient denies difficulty with sleep initiation or maintenance. Denies appetite disturbance.  Patient reports that energy and motivation have been good.  Patient denies any difficulty with concentration.  Patient denies any suicidal ideation. Needs Xanax  for sleep   Usually 1mg  hs.  03/28/21 appt noted: Has not been able to get Concerta  for 3 mos bc [facility] said they didn't have it.  Much more scattered without it. Still on fluoxetine  40 mg , Xanax  1-1.5 mg HS,  Typically sleeps well. Feels better on increased Prozac . Plan: Restart Concerta  72 mg every morning because markedly worse ADD over the last 3 months without it. Continue fluoxetine  40 mg and alprazolam  1 to 1-1/2 mg nightly  11/08/2021 appointment with the following noted: Doing ok. Rattled with son still home with OCD and depression.  He's doing ketamine tx. She's satisfied with meds and does better with Concerta  72 but can't always get it. NoSE Sleep fine with Xanax  HS usually less than 1.5 mg . Doing ok with less prozac  20 mg now Patient reports stable mood and denies depressed or irritable moods.  Patient denies any recent difficulty with anxiety.  Patient denies difficulty  with sleep initiation or maintenance. Denies appetite disturbance.  Patient reports that energy and motivation have been good.  Patient has worse difficulty with concentration.  Patient denies any suicidal ideation. No med changes  05/11/22 appt noted: Continues meds. Had Covid couple of mos ago. Not taking Prozac  and was very irritable.  Better on it. Good.  Pleased with meds.  No SE.   No concerns with Concerta  or meds. Usally alprazolam  1 mg HS.   Melatonin SE  N  12/21/2022 appointment noted: Current psych meds: NAC 600 daily, alprazolam  1 mg tablet 1-1/2 tablets nightly, fluoxetine  20 mg daily, concerta  72 mg AM Satisfied with the Concerta  for ADD. Duration about 12 hours.   Is forgetful but not sure why.   Sciatica on R.  Shot didn't help.  Sx worse over the last year.  Affected  mood.  However handling it as well as anyone. Needs Xanax  to sleep abut it is working. Has been doing PT which did help until shot made it worse again. Plan: continue fluoxetine  20 mg daily helped residual depression vs option switch to duloxetine  to see if can get pain benefit.  06/07/23 appt noted: Med: alprazolam  1 mg 1-1&1/2 HS, cyclobenzaprine  10 prn, fluox 20, Concerta  72 AM. Back surgery Dec fusion.  A lot of pain for 5 weeks.  Now 10 weeks post and pain much better. Tramadol  not helpful and stopped.   Mood and anxiety are ok now.  No px with Concerta .   No SE. Son still home and stressful.  Hx dep and OCD and took ketamine. Sleep ok.   Plan no changes  12/26/23 appt noted:  Med: alprazolam  1 mg 1-1&1/2 HS,  fluox 20, Concerta  72 AM. Not as good with sleep.  Takes a whle to get to sleep and awaken at 4.  For the last month.  Consistent with meds and no SE. Not sig dep and anxious. Son didn't benefit from ketamine. No known cause of insomnia.    Past Psychiatric Medication Trials:  Abilify SE,  fluoxetine  40 insomnia Wellbutrin  300,  Lexapro fatigue   Cymbalta  SE ins Concerta  72, Adderall SE jittery Xanax  hs, trazodone,  Ambien  hangover, Melatonin SE N  Son dep and OCD. Less effective Spravato tx.  Review of Systems:  Review of Systems  Cardiovascular:  Negative for palpitations.  Musculoskeletal:  Positive for back pain.  Neurological:  Negative for dizziness, tremors and weakness.  Psychiatric/Behavioral:  Positive for decreased concentration and sleep disturbance. Negative for agitation, behavioral problems, confusion, dysphoric mood,  hallucinations, self-injury and suicidal ideas. The patient is not nervous/anxious and is not hyperactive.     Medications: I have reviewed the patient's current medications.  Current Outpatient Medications  Medication Sig Dispense Refill   Acetylcysteine  600 MG CAPS Take 1 capsule (600 mg total) by mouth daily. 90 capsule 3   hydrochlorothiazide  (MICROZIDE ) 12.5 MG capsule Take 12.5 mg by mouth daily.     lithium  carbonate 150 MG capsule Take 1 capsule (150 mg total) by mouth at bedtime. 90 capsule 3   olmesartan (BENICAR) 40 MG tablet Take 40 mg by mouth daily.     omeprazole (PRILOSEC) 20 MG capsule Take 20 mg by mouth daily as needed (acid reflux).     psyllium (HYDROCIL/METAMUCIL) 95 % PACK Take 1 packet by mouth daily as needed for mild constipation.     rosuvastatin  (CRESTOR ) 10 MG tablet Take 10 mg by mouth at bedtime.     ALPRAZolam  (XANAX ) 1 MG tablet  TAKE 1 TABLET TO   ONE AND ONE-HALF TABLETS AT BEDTIME AS NEEDED FOR ANXIETY 135 tablet 1   cyclobenzaprine  (FLEXERIL ) 10 MG tablet Take 10 mg by mouth 3 (three) times daily as needed for muscle spasms. (Patient not taking: Reported on 12/26/2023)     FLUoxetine  (PROZAC ) 20 MG capsule Take 1 capsule (20 mg total) by mouth daily. 90 capsule 1   influenza vaccine adjuvanted (FLUAD  QUADRIVALENT) 0.5 ML injection Inject into the muscle. (Patient not taking: Reported on 12/26/2023) 0.5 mL 0   methylphenidate  (CONCERTA ) 36 MG PO CR tablet Take 2 tablets (72 mg total) by mouth daily. 180 tablet 0   NASONEX  50 MCG/ACT nasal spray PLACE 2 SPRAYS INTO THE NOSE DAILY AS NEEDED. (Patient not taking: Reported on 12/26/2023) 17 g 5   traMADol  (ULTRAM ) 50 MG tablet Take 1-2 tablets (50-100 mg total) by mouth every 6 (six) hours as needed for moderate pain (pain score 4-6). (Patient not taking: Reported on 12/26/2023) 60 tablet 0   Zoster Vaccine Adjuvanted (SHINGRIX ) injection Inject into the muscle. (Patient not taking: Reported on 12/26/2023) 0.5 mL 0   No  current facility-administered medications for this visit.    Medication Side Effects: None  Allergies:  Allergies  Allergen Reactions   Demerol  [Meperidine] Nausea And Vomiting   Metoprolol  Nausea Only    Nausea and inability to increase heart rate with cardiovascular exercise   Oxycodone -Acetaminophen  Nausea Only   Sulfa Antibiotics Nausea And Vomiting    Past Medical History:  Diagnosis Date   Allergic rhinitis    seasonal   Anemia    Arthritis    GERD (gastroesophageal reflux disease)    Hypertension    Pain in shoulder    right side   SOB (shortness of breath) on exertion     Family History  Problem Relation Age of Onset   Stroke Mother 58   Dementia Mother    Stroke Father 16   Heart disease Brother 61       LAD stenting   Colon cancer Maternal Uncle    Diabetes Son        Type 1   Prostate cancer Maternal Uncle    Stroke Paternal Grandmother        mid 71s   Hypertension Paternal Grandmother     Social History   Socioeconomic History   Marital status: Married    Spouse name: Not on file   Number of children: Not on file   Years of education: Not on file   Highest education level: Not on file  Occupational History   Not on file  Tobacco Use   Smoking status: Former    Current packs/day: 0.00    Types: Cigarettes    Quit date: 04/24/1976    Years since quitting: 47.7   Smokeless tobacco: Never   Tobacco comments:    smoked [age], up to 1ppd  Vaping Use   Vaping status: Never Used  Substance and Sexual Activity   Alcohol  use: Yes    Comment: wine-2-3 days per week    Drug use: No   Sexual activity: Not on file  Other Topics Concern   Not on file  Social History Narrative   Not on file   Social Drivers of Health   Financial Resource Strain: Not on file  Food Insecurity: Not on file  Transportation Needs: Not on file  Physical Activity: Not on file  Stress: Not on file  Social Connections: Not on file  Intimate Partner Violence: Not  on file    Past Medical History, Surgical history, Social history, and Family history were reviewed and updated as appropriate.   M had dementia onset 47's.  Please see review of systems for further details on the patient's review from today.   Objective:   Physical Exam:  There were no vitals taken for this visit.  Physical Exam Constitutional:      General: She is not in acute distress. Musculoskeletal:        General: No deformity.  Neurological:     Mental Status: She is alert and oriented to person, place, and time.     Coordination: Coordination normal.     Gait: Gait normal.  Psychiatric:        Attention and Perception: She is inattentive.        Mood and Affect: Mood is not anxious or depressed. Affect is not inappropriate.        Speech: Speech normal. Speech is not slurred.        Behavior: Behavior normal. Behavior is not slowed.        Thought Content: Thought content normal. Thought content is not paranoid or delusional. Thought content does not include homicidal or suicidal ideation.        Cognition and Memory: Cognition normal.        Judgment: Judgment normal.     Comments: Insight intact. No auditory or visual hallucinations. No delusions.      Lab Review:     Component Value Date/Time   NA 137 03/28/2023 0733   K 3.6 03/28/2023 0733   CL 103 03/28/2023 0733   CO2 26 03/20/2023 1343   GLUCOSE 86 03/28/2023 0733   BUN 16 03/28/2023 0733   CREATININE 0.90 03/28/2023 0733   CALCIUM  9.3 03/20/2023 1343   PROT 7.3 05/18/2014 1321   ALBUMIN  4.5 05/18/2014 1321   AST 25 05/18/2014 1321   ALT 18 05/18/2014 1321   ALKPHOS 45 05/18/2014 1321   BILITOT 0.5 05/18/2014 1321   GFRNONAA >60 03/20/2023 1343   GFRAA >90 10/07/2013 0840       Component Value Date/Time   WBC 7.7 03/20/2023 1343   RBC 3.45 (L) 03/20/2023 1343   HGB 11.2 (L) 03/28/2023 0733   HCT 33.0 (L) 03/28/2023 0733   PLT 298 03/20/2023 1343   MCV 91.0 03/20/2023 1343   MCH 31.0  03/20/2023 1343   MCHC 34.1 03/20/2023 1343   RDW 11.5 03/20/2023 1343   LYMPHSABS 2.4 05/18/2014 1321   MONOABS 0.4 05/18/2014 1321   EOSABS 0.1 05/18/2014 1321   BASOSABS 0.0 05/18/2014 1321    No results found for: POCLITH, LITHIUM    No results found for: PHENYTOIN, PHENOBARB, VALPROATE, CBMZ   .res Assessment: Plan:    Delaynee was seen today for follow-up, depression and anxiety.  Diagnoses and all orders for this visit:  Attention deficit hyperactivity disorder (ADHD), predominantly inattentive type -     methylphenidate  (CONCERTA ) 36 MG PO CR tablet; Take 2 tablets (72 mg total) by mouth daily.  Recurrent major depressive disorder, in full remission (HCC)  Insomnia due to mental condition -     ALPRAZolam  (XANAX ) 1 MG tablet; TAKE 1 TABLET TO   ONE AND ONE-HALF TABLETS AT BEDTIME AS NEEDED FOR ANXIETY  Major depression in complete remission (HCC) -     FLUoxetine  (PROZAC ) 20 MG capsule; Take 1 capsule (20 mg total) by mouth daily.      RO [facility]CI  30 min face to face time with patient was spent on counseling and coordination of care  Continue NAC for mild cognitive complaints. Disc Dr. Lucia article on Lithium  and memory DT mother's hx dementia.   She wants to return to lowest dose lithium  to help reduce risk demential. Resume NAC (N-acetylcysteine ) 600 mg capsule 1 daily for memory and concentration. Disc recent availability problems.  Disc dealing with med shortages and strategies around this problem.  She has missed the Concerta  for 3 months because she was told it was not available.  However other patients are getting it from other pharmacies.  We will switch to [facility].  This is likely to resolve after the new year. Continue Concerta  72 mg every morning because markedly worse ADD over the last 3 months without it.  We discussed the short-term risks associated with benzodiazepines including sedation and increased fall risk among others.  Discussed long-term  side effect risk including dependence, potential withdrawal symptoms, and the potential eventual dose-related risk of dementia.  But recent studies from 2020 dispute this association between benzodiazepines and dementia risk. Newer studies in 2020 do not support an association with dementia. Continue Xanax  1 to 1-1/2 mg nightly for insomnia VS switch to clonazepam. Lowest dosage of Bz.  continue fluoxetine  20 mg daily helped residual depression   Fu 6 mos  Lorene Macintosh MD, DFAPA  Please see After Visit Summary for patient specific instructions.  Future Appointments  Date Time Provider Department Center  12/27/2023  1:00 PM [facility] None       No orders of the defined types were placed in this encounter.     -------------------------------

## 2023-12-27 ENCOUNTER — Ambulatory Visit (HOSPITAL_COMMUNITY)
Admission: RE | Admit: 2023-12-27 | Discharge: 2023-12-27 | Disposition: A | Discharge status: Home / Self Care | Source: Ambulatory Visit | Attending: Internal Medicine | Admitting: Internal Medicine

## 2023-12-27 DIAGNOSIS — M810 Age-related osteoporosis without current pathological fracture: Secondary | ICD-10-CM | POA: Diagnosis not present

## 2023-12-27 MED ORDER — DENOSUMAB 60 MG/ML ~~LOC~~ SOSY
PREFILLED_SYRINGE | SUBCUTANEOUS | Status: AC
Start: 2023-12-27 — End: 2023-12-27
  Filled 2023-12-27: qty 1

## 2023-12-27 MED ORDER — DENOSUMAB 60 MG/ML ~~LOC~~ SOSY
60.0000 mg | PREFILLED_SYRINGE | Freq: Once | SUBCUTANEOUS | Status: AC
  Administered 2023-12-27: 60 mg via SUBCUTANEOUS

## 2023-12-31 DIAGNOSIS — D485 Neoplasm of uncertain behavior of skin: Secondary | ICD-10-CM | POA: Diagnosis not present

## 2023-12-31 DIAGNOSIS — L920 Granuloma annulare: Secondary | ICD-10-CM | POA: Diagnosis not present

## 2024-01-07 ENCOUNTER — Other Ambulatory Visit (HOSPITAL_BASED_OUTPATIENT_CLINIC_OR_DEPARTMENT_OTHER): Payer: Self-pay

## 2024-01-07 DIAGNOSIS — Z23 Encounter for immunization: Secondary | ICD-10-CM | POA: Diagnosis not present

## 2024-01-07 MED ORDER — COMIRNATY 30 MCG/0.3ML IM SUSY
0.3000 mL | PREFILLED_SYRINGE | Freq: Once | INTRAMUSCULAR | 0 refills | Status: AC
  Filled 2024-01-07: qty 0.3, 1d supply, fill #0

## 2024-01-08 DIAGNOSIS — Z4802 Encounter for removal of sutures: Secondary | ICD-10-CM | POA: Diagnosis not present

## 2024-01-08 DIAGNOSIS — I129 Hypertensive chronic kidney disease with stage 1 through stage 4 chronic kidney disease, or unspecified chronic kidney disease: Secondary | ICD-10-CM | POA: Diagnosis not present

## 2024-01-08 DIAGNOSIS — L920 Granuloma annulare: Secondary | ICD-10-CM | POA: Diagnosis not present

## 2024-01-08 DIAGNOSIS — N1831 Chronic kidney disease, stage 3a: Secondary | ICD-10-CM | POA: Diagnosis not present

## 2024-01-15 ENCOUNTER — Other Ambulatory Visit (HOSPITAL_BASED_OUTPATIENT_CLINIC_OR_DEPARTMENT_OTHER): Payer: Self-pay

## 2024-01-15 DIAGNOSIS — Z23 Encounter for immunization: Secondary | ICD-10-CM | POA: Diagnosis not present

## 2024-01-15 MED ORDER — FLUZONE HIGH-DOSE 0.5 ML IM SUSY
0.5000 mL | PREFILLED_SYRINGE | Freq: Once | INTRAMUSCULAR | 0 refills | Status: AC
  Filled 2024-01-15: qty 0.5, 1d supply, fill #0

## 2024-02-20 DIAGNOSIS — L920 Granuloma annulare: Secondary | ICD-10-CM | POA: Diagnosis not present

## 2024-03-11 DIAGNOSIS — L2989 Other pruritus: Secondary | ICD-10-CM | POA: Diagnosis not present

## 2024-03-11 DIAGNOSIS — L308 Other specified dermatitis: Secondary | ICD-10-CM | POA: Diagnosis not present

## 2024-03-11 DIAGNOSIS — L920 Granuloma annulare: Secondary | ICD-10-CM | POA: Diagnosis not present

## 2024-03-11 DIAGNOSIS — L821 Other seborrheic keratosis: Secondary | ICD-10-CM | POA: Diagnosis not present

## 2024-03-12 ENCOUNTER — Other Ambulatory Visit (HOSPITAL_BASED_OUTPATIENT_CLINIC_OR_DEPARTMENT_OTHER): Payer: Self-pay

## 2024-03-14 ENCOUNTER — Telehealth: Payer: Self-pay | Admitting: Psychiatry

## 2024-03-14 NOTE — Telephone Encounter (Signed)
 Jessica [name] called asking for a refill on her generic concerta  36 mg. 90 day supply. Pharmacy is [facility] at [address]. Next appt 06/24/24

## 2024-03-14 NOTE — Telephone Encounter (Signed)
Due 11/30

## 2024-03-18 DIAGNOSIS — M47816 Spondylosis without myelopathy or radiculopathy, lumbar region: Secondary | ICD-10-CM | POA: Diagnosis not present

## 2024-03-18 DIAGNOSIS — Z981 Arthrodesis status: Secondary | ICD-10-CM | POA: Diagnosis not present

## 2024-03-18 DIAGNOSIS — S39012A Strain of muscle, fascia and tendon of lower back, initial encounter: Secondary | ICD-10-CM | POA: Diagnosis not present

## 2024-03-19 ENCOUNTER — Other Ambulatory Visit: Payer: Self-pay

## 2024-03-19 DIAGNOSIS — F900 Attention-deficit hyperactivity disorder, predominantly inattentive type: Secondary | ICD-10-CM

## 2024-03-19 MED ORDER — METHYLPHENIDATE HCL ER (OSM) 36 MG PO TBCR: 72.0000 mg | EXTENDED_RELEASE_TABLET | Freq: Every day | ORAL | 0 refills | Status: AC

## 2024-03-19 NOTE — Telephone Encounter (Signed)
 Pended

## 2024-03-28 DIAGNOSIS — H00015 Hordeolum externum left lower eyelid: Secondary | ICD-10-CM | POA: Diagnosis not present

## 2024-03-28 DIAGNOSIS — H00025 Hordeolum internum left lower eyelid: Secondary | ICD-10-CM | POA: Diagnosis not present

## 2024-06-24 ENCOUNTER — Ambulatory Visit: Admitting: Psychiatry
